# Patient Record
Sex: Female | Born: 1955
Health system: Southern US, Community
[De-identification: ages and names within clinical notes are randomized; demographics above are authoritative.]

## PROBLEM LIST (undated history)

## (undated) ENCOUNTER — Ambulatory Visit: Admission: EM

## (undated) DIAGNOSIS — M1990 Unspecified osteoarthritis, unspecified site: Secondary | ICD-10-CM

## (undated) DIAGNOSIS — F419 Anxiety disorder, unspecified: Secondary | ICD-10-CM

## (undated) DIAGNOSIS — N816 Rectocele: Secondary | ICD-10-CM

## (undated) DIAGNOSIS — K5900 Constipation, unspecified: Secondary | ICD-10-CM

## (undated) DIAGNOSIS — I779 Disorder of arteries and arterioles, unspecified: Secondary | ICD-10-CM

## (undated) DIAGNOSIS — D219 Benign neoplasm of connective and other soft tissue, unspecified: Secondary | ICD-10-CM

## (undated) DIAGNOSIS — F32A Depression, unspecified: Secondary | ICD-10-CM

## (undated) DIAGNOSIS — I2510 Atherosclerotic heart disease of native coronary artery without angina pectoris: Secondary | ICD-10-CM

## (undated) DIAGNOSIS — I351 Nonrheumatic aortic (valve) insufficiency: Secondary | ICD-10-CM

## (undated) DIAGNOSIS — F329 Major depressive disorder, single episode, unspecified: Secondary | ICD-10-CM

## (undated) DIAGNOSIS — M549 Dorsalgia, unspecified: Secondary | ICD-10-CM

## (undated) DIAGNOSIS — E7800 Pure hypercholesterolemia, unspecified: Secondary | ICD-10-CM

## (undated) DIAGNOSIS — E2740 Unspecified adrenocortical insufficiency: Secondary | ICD-10-CM

## (undated) HISTORY — DX: Unspecified adrenocortical insufficiency: E27.40

## (undated) HISTORY — DX: Constipation, unspecified: K59.00

## (undated) HISTORY — DX: Benign neoplasm of connective and other soft tissue, unspecified: D21.9

## (undated) HISTORY — DX: Pure hypercholesterolemia, unspecified: E78.00

## (undated) HISTORY — DX: Rectocele: N81.6

## (undated) HISTORY — DX: Nonrheumatic aortic (valve) insufficiency: I35.1

## (undated) HISTORY — DX: Depression, unspecified: F32.A

## (undated) HISTORY — DX: Unspecified osteoarthritis, unspecified site: M19.90

## (undated) HISTORY — DX: Dorsalgia, unspecified: M54.9

## (undated) HISTORY — DX: Anxiety disorder, unspecified: F41.9

## (undated) HISTORY — DX: Disorder of arteries and arterioles, unspecified: I77.9

## (undated) HISTORY — DX: Major depressive disorder, single episode, unspecified: F32.9

## (undated) HISTORY — DX: Atherosclerotic heart disease of native coronary artery without angina pectoris: I25.10

---

## 2000-08-30 ENCOUNTER — Emergency Department (HOSPITAL_COMMUNITY): Admission: EM | Admit: 2000-08-30 | Discharge: 2000-08-30 | Payer: Self-pay | Admitting: Emergency Medicine

## 2000-09-02 ENCOUNTER — Emergency Department (HOSPITAL_COMMUNITY): Admission: EM | Admit: 2000-09-02 | Discharge: 2000-09-02 | Payer: Self-pay | Admitting: Emergency Medicine

## 2000-10-18 ENCOUNTER — Emergency Department (HOSPITAL_COMMUNITY): Admission: EM | Admit: 2000-10-18 | Discharge: 2000-10-19 | Payer: Self-pay | Admitting: Emergency Medicine

## 2000-10-19 ENCOUNTER — Ambulatory Visit (HOSPITAL_COMMUNITY): Admission: RE | Admit: 2000-10-19 | Discharge: 2000-10-19 | Payer: Self-pay | Admitting: Internal Medicine

## 2000-10-19 ENCOUNTER — Encounter: Payer: Self-pay | Admitting: Internal Medicine

## 2000-12-11 ENCOUNTER — Ambulatory Visit (HOSPITAL_COMMUNITY): Admission: RE | Admit: 2000-12-11 | Discharge: 2000-12-11 | Payer: Self-pay | Admitting: Family Medicine

## 2000-12-11 ENCOUNTER — Encounter: Payer: Self-pay | Admitting: Family Medicine

## 2000-12-31 ENCOUNTER — Emergency Department (HOSPITAL_COMMUNITY): Admission: EM | Admit: 2000-12-31 | Discharge: 2000-12-31 | Payer: Self-pay | Admitting: *Deleted

## 2001-02-08 ENCOUNTER — Emergency Department (HOSPITAL_COMMUNITY): Admission: EM | Admit: 2001-02-08 | Discharge: 2001-02-09 | Payer: Self-pay | Admitting: *Deleted

## 2001-02-08 ENCOUNTER — Encounter: Payer: Self-pay | Admitting: Emergency Medicine

## 2001-03-04 ENCOUNTER — Encounter: Payer: Self-pay | Admitting: Internal Medicine

## 2001-03-04 ENCOUNTER — Ambulatory Visit (HOSPITAL_COMMUNITY): Admission: RE | Admit: 2001-03-04 | Discharge: 2001-03-04 | Payer: Self-pay | Admitting: Internal Medicine

## 2001-03-17 ENCOUNTER — Encounter: Payer: Self-pay | Admitting: Internal Medicine

## 2001-03-17 ENCOUNTER — Ambulatory Visit (HOSPITAL_COMMUNITY): Admission: RE | Admit: 2001-03-17 | Discharge: 2001-03-17 | Payer: Self-pay | Admitting: Internal Medicine

## 2001-05-01 ENCOUNTER — Emergency Department (HOSPITAL_COMMUNITY): Admission: EM | Admit: 2001-05-01 | Discharge: 2001-05-02 | Payer: Self-pay | Admitting: Internal Medicine

## 2001-05-14 ENCOUNTER — Encounter: Payer: Self-pay | Admitting: Internal Medicine

## 2001-05-14 ENCOUNTER — Emergency Department (HOSPITAL_COMMUNITY): Admission: EM | Admit: 2001-05-14 | Discharge: 2001-05-14 | Payer: Self-pay | Admitting: Internal Medicine

## 2002-01-11 ENCOUNTER — Ambulatory Visit (HOSPITAL_COMMUNITY): Admission: RE | Admit: 2002-01-11 | Discharge: 2002-01-11 | Payer: Self-pay | Admitting: Internal Medicine

## 2002-01-11 ENCOUNTER — Encounter: Payer: Self-pay | Admitting: Internal Medicine

## 2002-02-17 ENCOUNTER — Encounter: Payer: Self-pay | Admitting: Emergency Medicine

## 2002-02-17 ENCOUNTER — Emergency Department (HOSPITAL_COMMUNITY): Admission: EM | Admit: 2002-02-17 | Discharge: 2002-02-17 | Payer: Self-pay | Admitting: Emergency Medicine

## 2002-02-18 ENCOUNTER — Emergency Department (HOSPITAL_COMMUNITY): Admission: EM | Admit: 2002-02-18 | Discharge: 2002-02-18 | Payer: Self-pay | Admitting: Internal Medicine

## 2002-10-10 ENCOUNTER — Encounter: Payer: Self-pay | Admitting: Internal Medicine

## 2002-10-10 ENCOUNTER — Ambulatory Visit (HOSPITAL_COMMUNITY): Admission: RE | Admit: 2002-10-10 | Discharge: 2002-10-10 | Payer: Self-pay | Admitting: Internal Medicine

## 2003-01-06 ENCOUNTER — Ambulatory Visit (HOSPITAL_COMMUNITY): Admission: RE | Admit: 2003-01-06 | Discharge: 2003-01-06 | Payer: Self-pay | Admitting: *Deleted

## 2003-01-18 ENCOUNTER — Emergency Department (HOSPITAL_COMMUNITY): Admission: EM | Admit: 2003-01-18 | Discharge: 2003-01-18 | Payer: Self-pay | Admitting: Internal Medicine

## 2003-02-01 ENCOUNTER — Ambulatory Visit (HOSPITAL_COMMUNITY): Admission: RE | Admit: 2003-02-01 | Discharge: 2003-02-01 | Payer: Self-pay | Admitting: Internal Medicine

## 2003-02-03 ENCOUNTER — Ambulatory Visit (HOSPITAL_COMMUNITY): Admission: RE | Admit: 2003-02-03 | Discharge: 2003-02-03 | Payer: Self-pay | Admitting: Internal Medicine

## 2003-03-06 ENCOUNTER — Ambulatory Visit (HOSPITAL_COMMUNITY): Admission: RE | Admit: 2003-03-06 | Discharge: 2003-03-06 | Payer: Self-pay | Admitting: Internal Medicine

## 2003-03-13 ENCOUNTER — Ambulatory Visit (HOSPITAL_COMMUNITY): Admission: RE | Admit: 2003-03-13 | Discharge: 2003-03-13 | Payer: Self-pay | Admitting: Internal Medicine

## 2003-05-22 ENCOUNTER — Encounter (HOSPITAL_COMMUNITY): Admission: RE | Admit: 2003-05-22 | Discharge: 2003-06-21 | Payer: Self-pay | Admitting: Orthopedic Surgery

## 2003-06-22 ENCOUNTER — Encounter (HOSPITAL_COMMUNITY): Admission: RE | Admit: 2003-06-22 | Discharge: 2003-07-22 | Payer: Self-pay | Admitting: Orthopedic Surgery

## 2003-08-04 ENCOUNTER — Ambulatory Visit (HOSPITAL_COMMUNITY): Admission: RE | Admit: 2003-08-04 | Discharge: 2003-08-04 | Payer: Self-pay | Admitting: Internal Medicine

## 2003-11-18 ENCOUNTER — Emergency Department (HOSPITAL_COMMUNITY): Admission: EM | Admit: 2003-11-18 | Discharge: 2003-11-18 | Payer: Self-pay | Admitting: Emergency Medicine

## 2004-01-08 ENCOUNTER — Other Ambulatory Visit: Admission: RE | Admit: 2004-01-08 | Discharge: 2004-01-08 | Payer: Self-pay | Admitting: Obstetrics and Gynecology

## 2004-01-25 ENCOUNTER — Ambulatory Visit (HOSPITAL_COMMUNITY): Admission: RE | Admit: 2004-01-25 | Discharge: 2004-01-25 | Payer: Self-pay | Admitting: Family Medicine

## 2004-02-10 ENCOUNTER — Emergency Department (HOSPITAL_COMMUNITY): Admission: EM | Admit: 2004-02-10 | Discharge: 2004-02-10 | Payer: Self-pay | Admitting: *Deleted

## 2004-02-11 ENCOUNTER — Inpatient Hospital Stay (HOSPITAL_COMMUNITY): Admission: AD | Admit: 2004-02-11 | Discharge: 2004-02-11 | Payer: Self-pay | Admitting: Cardiology

## 2004-02-13 ENCOUNTER — Ambulatory Visit (HOSPITAL_COMMUNITY): Admission: RE | Admit: 2004-02-13 | Discharge: 2004-02-13 | Payer: Self-pay | Admitting: Internal Medicine

## 2004-03-06 ENCOUNTER — Ambulatory Visit (HOSPITAL_COMMUNITY): Admission: RE | Admit: 2004-03-06 | Discharge: 2004-03-06 | Payer: Self-pay | Admitting: *Deleted

## 2004-03-08 ENCOUNTER — Ambulatory Visit (HOSPITAL_COMMUNITY): Admission: RE | Admit: 2004-03-08 | Discharge: 2004-03-09 | Payer: Self-pay | Admitting: Cardiology

## 2004-03-08 HISTORY — PX: CARDIAC CATHETERIZATION: SHX172

## 2004-03-14 ENCOUNTER — Emergency Department (HOSPITAL_COMMUNITY): Admission: EM | Admit: 2004-03-14 | Discharge: 2004-03-15 | Payer: Self-pay | Admitting: *Deleted

## 2004-04-04 ENCOUNTER — Emergency Department (HOSPITAL_COMMUNITY): Admission: EM | Admit: 2004-04-04 | Discharge: 2004-04-04 | Payer: Self-pay | Admitting: Emergency Medicine

## 2004-06-24 ENCOUNTER — Emergency Department (HOSPITAL_COMMUNITY): Admission: EM | Admit: 2004-06-24 | Discharge: 2004-06-24 | Payer: Self-pay | Admitting: Emergency Medicine

## 2005-03-10 ENCOUNTER — Ambulatory Visit (HOSPITAL_COMMUNITY): Admission: RE | Admit: 2005-03-10 | Discharge: 2005-03-10 | Payer: Self-pay | Admitting: Internal Medicine

## 2005-05-21 ENCOUNTER — Encounter (HOSPITAL_COMMUNITY): Admission: RE | Admit: 2005-05-21 | Discharge: 2005-06-20 | Payer: Self-pay | Admitting: Neurosurgery

## 2005-06-26 ENCOUNTER — Encounter (HOSPITAL_COMMUNITY): Admission: RE | Admit: 2005-06-26 | Discharge: 2005-07-26 | Payer: Self-pay | Admitting: Neurosurgery

## 2005-12-08 ENCOUNTER — Emergency Department (HOSPITAL_COMMUNITY): Admission: EM | Admit: 2005-12-08 | Discharge: 2005-12-08 | Payer: Self-pay | Admitting: Emergency Medicine

## 2006-01-12 ENCOUNTER — Ambulatory Visit (HOSPITAL_COMMUNITY): Admission: RE | Admit: 2006-01-12 | Discharge: 2006-01-12 | Payer: Self-pay | Admitting: Urology

## 2006-03-19 HISTORY — PX: NM MYOCAR PERF WALL MOTION: HXRAD629

## 2006-11-30 ENCOUNTER — Ambulatory Visit (HOSPITAL_COMMUNITY): Admission: RE | Admit: 2006-11-30 | Discharge: 2006-11-30 | Payer: Self-pay | Admitting: Internal Medicine

## 2007-03-09 HISTORY — PX: OTHER SURGICAL HISTORY: SHX169

## 2008-09-28 ENCOUNTER — Other Ambulatory Visit: Admission: RE | Admit: 2008-09-28 | Discharge: 2008-09-28 | Payer: Self-pay | Admitting: Obstetrics and Gynecology

## 2008-10-03 ENCOUNTER — Ambulatory Visit (HOSPITAL_COMMUNITY): Admission: RE | Admit: 2008-10-03 | Discharge: 2008-10-03 | Payer: Self-pay | Admitting: Obstetrics and Gynecology

## 2009-03-12 ENCOUNTER — Ambulatory Visit (HOSPITAL_COMMUNITY): Admission: RE | Admit: 2009-03-12 | Discharge: 2009-03-12 | Payer: Self-pay | Admitting: Internal Medicine

## 2009-10-18 ENCOUNTER — Other Ambulatory Visit: Admission: RE | Admit: 2009-10-18 | Discharge: 2009-10-18 | Payer: Self-pay | Admitting: Obstetrics and Gynecology

## 2010-03-20 HISTORY — PX: DOPPLER ECHOCARDIOGRAPHY: SHX263

## 2010-06-07 NOTE — Cardiovascular Report (Signed)
NAMEPEYTYN, TRINE               ACCOUNT NO.:  [id number]   MEDICAL RECORD NO.:  [id number]          PATIENT TYPE:  OIB   LOCATION:  3705                         FACILITY:  [facility]   PHYSICIAN:  Vonna Kotyk R. Jacinto Halim, MD       DATE OF BIRTH:  [birth year]   DATE OF PROCEDURE:  03/08/2004  DATE OF DISCHARGE:  03/09/2004                              CARDIAC CATHETERIZATION   REFERRING PHYSICIAN:  1.  Kem Boroughs, M.D.  2.  Elfredia Nevins, M.D.   PROCEDURE PERFORMED:  1.  Left ventriculography.  2.  Selective right and left coronary arteriography.  3.  Intercoronary nitroglycerin administration.  4.  Intravascular ultrasound Interrogation of ostial right coronary artery.   INDICATION:  Ms. Debra Graves is a 55 year old female with a history of  GERD, hypertension, hyperlipidemia and known coronary artery disease.  She  had known ostial RCA, about 60-70% calcific stenosis from prior cardiac  catheterization on  January 06, 2003.  Because of recurrent chest pain she  underwent a coronary stress test which had revealed anterior wall ischemia.  She was brought to the cardiac catheterization lab for evaluation of  progression of coronary disease.   HEMODYNAMIC DATA:  The left ventricular pressures were 115/6 with end-  diastolic pressure of 13 mmHg.  The aortic pressures were 120/66 with a mean  of 91 mmHg.  There was no pressure gradient across the aortic valve.   ANGIOGRAPHIC DATA:  Left ventricle.  Left ventricular systolic function was  normal, ejection fraction was estimated at 60%.  There was no significant  mitral regurgitation.   Right coronary artery.  The right coronary artery is a large caliber vessel  and the dominant vessel.  It is tortuous.  The ostium has again calcific  hazy looking 60-70% stenosis which was damp, in which there was severe  damping with engagement of the right coronary artery.   Left main coronary artery.  The left main coronary artery is a large caliber  vessel.  It has got mild diffuse calcification.  There is no luminal  obstruction.   Left anterior descending artery.  Left anterior descending artery is a large  caliber vessel.  It gives rise into a small diagonal-1 and a moderate to  large sized diagonal-2.  There is mild disease in the LAD and also diagonal.  Especially the mid LAD shows a mildly calcific hazy 20-30% stenosis but is  very smooth.   Circumflex.  The circumflex is a large caliber vessel.  It has got mild  luminal irregularity.   Ramus intermedius.  Ramus intermedius is a moderate caliber vessel.  It is  normal.   IMPRESSIONS:  1.  Normal left ventricular systolic function, ejection fraction 60%.  2.  Mild progression of coronary artery disease.  There is diffuse      calcification noted in the right coronary artery and also in the left      coronary system.  Probably 30% stenosis, at most 40, in the mid left      anterior descending which is mildly calcified and hazy but does not  appear to be unstable.  3.  Stenosis, 60-70%, of the ostial right coronary artery.   IVUS DATA:  There was mild eccentric calcification of the proximal and  ostial RCA.  There was at most 51% luminal narrowing of the ostial RCA.  The  lumen had 4.1 mm2 area.   RECOMMENDATION:  Given the fact that the lumen area is greater than 4 mm and  also the angiographic appearance has not significantly changed from prior  cardiac catheterization on January 06, 2003, and the fact that the  Cardiolite showed anterior ischemia, this right coronary artery involvement  does not correlate with the stress data.  Hence, continued medical therapy  with very aggressive risk factor modification is indicated given  calcification of the coronaries is indicated.  The LDL goal of 70 and HDL >  50 is indicated.  If patient continues to have recurrent chest discomfort, consideration can  be given for PCI of the ostial RCA.   TECHNIQUE AND PROCEDURE:   Diagnostic cardiac catheterization under the usual  sterile precautions using a 6 French right femoral artery access, a 6 [place]  multipurpose B2 catheter was advanced into the ascending aorta with a 0.025  J wire.  The catheter was gently advanced into the left ventricle, left  ventricular pressures were monitored.  Hand contrast injection of the left  ventricle was performed both in the LAO and RAO projections.  The catheter  was flushed with saline, pulled back into the ascending aorta and pressure  gradient across the aortic valve was monitored.  The right coronary artery  was selectively engaged and angiography was performed.  In a similar  fashion, the left main coronary artery was selectively engaged and  angiography was performed.  Then the catheter was pulled out of the body in  the usual fashion.   TECHNIQUE OF IVUS INTERROGATION:  After giving 5000 units of heparin and  keeping the ACT at therapeutic range, a 190 cm x 0.014 inch Luge guide wire  was advanced into the right coronary artery and the tip of the wire was  carefully positioned in the distal RCA.  Then a Galaxy IVUS catheter was  utilized to perform IVUS interrogation.  The careful automatic pullback was  performed and the data was carefully analyzed.  Then the IVUS catheter was  withdrawn out of the body and angiography was repeated.  During the  procedure, twice 200 mg  intracoronary nitroglycerin was also administered.  Then the catheter and  the guide wire was withdrawn out of the body in the usual fashion.  Patient  tolerated the procedure well.  A total of 125 mL contrast was utilized for  the procedure.      JRG/MEDQ  D:  03/08/2004  T:  03/10/2004  Job:  [id number]   cc:   Dani Gobble, MD  Fax: [phone]   Madelin Rear. Sherwood Gambler, MD  [address]  [place]  [place] [address]  Fax: [phone]

## 2010-06-07 NOTE — Discharge Summary (Signed)
Debra Graves, Debra Graves               ACCOUNT NO.:  [id number]   MEDICAL RECORD NO.:  [id number]          PATIENT TYPE:  OIB   LOCATION:  3705                         FACILITY:  [facility]   PHYSICIAN:  Pamella Pert, M.D.DATE OF BIRTH:  Oct 15, 1955   DATE OF ADMISSION:  03/08/2004  DATE OF DISCHARGE:  03/09/2004                                 DISCHARGE SUMMARY   ADMISSION DIAGNOSES:  1.  Chest pain with known coronary artery disease, mild trivial mitral      regurgitation and tricuspid regurgitation.  2.  Gastroesophageal reflux disease.  3.  Depression.  4.  Hyperlipidemia.  5.  Ongoing tobacco use.   DISCHARGE DIAGNOSES:  1.  Chest pain with known coronary artery disease, mild trivial mitral      regurgitation and tricuspid regurgitation.  2.  Gastroesophageal reflux disease.  3.  Depression.  4.  Hyperlipidemia.  5.  Ongoing tobacco use.   PROCEDURE:  Cardiac catheterization , March 08, 2004, Dr. Jacinto Halim.   BRIEF HISTORY:  The patient is a 55 year old female medical patient of Dr.  Artis Delay with multiple medical problems.  She previously underwent  cardiac catheterization in 2004 for chest pain which showed 10% ostial left  main, stenosis in the left circumflex with mild luminal irregularities,  normal ramus intermedius, LAD had a 20% stenosis.  The RCA had an ostial 70%  stenosis.  The patient has returned with recurrent chest pain and was  admitted to [facility] and transferred to [facility] for  cardiac evaluation in late January but signed out AMA.  She now returns and  is admitted for elective cardiac catheterization.   PAST MEDICAL HISTORY:  As listed above.   For further history and physical, please see the dictated note.   HOSPITAL COURSE:  The patient was admitted and taken to the cath lab by Dr.  Jacinto Halim.  She underwent cardiac catheterization which showed the ostial RCA  still has a 50-60% stenosis.  This was evaluated by IVUS.  The LAD has  a 30%  to 40% stenosis in the mid portion.  The EF was 60%.  There was no mitral  regurgitation.  There was essentially no change from her previous study.  Dr. Jacinto Halim added Imdur to the patient's regimen and plans to discharge her  home.  She was seen the following morning by Dr. Elsie Lincoln.  Electrolytes were  normal, sodium was 140, potassium 4.1, chloride 109, CO2 26, BUN 12,  creatinine 0.8, glucose 102.  Hemoglobin 10.6, hematocrit 38.1, white count  6800, platelets 204,000.  The catheterization site looks good, she is having  no chest discomfort and we will plan to discharge her home today to resume  her home medications.   MEDICATIONS:  1.  Zoloft 200 mg daily.  2.  Aspirin 81 mg daily.  3.  Ativan 1 mg five times a day.  4.  Nexium 40 mg daily.  5.  __________ daily.  6.  Arthrotec q.i.d.  7.  Activella one daily.  8.  Niferex 150 mg daily.  9.  Nitroglycerin 0.4 mg sublingual  p.r.n.  10. Lasix 20 mg daily p.r.n.  11. Antivert 25 mg daily p.r.n.  12. Imdur 30 mg daily.   ACTIVITY:  Light to moderate, no lifting over 10 pounds, no driving or  strenuous activity.   Will plan to follow up with Dr. Domingo Sep in 2 weeks and our office will call  and make the appointment.      WDJ/MEDQ  D:  03/09/2004  T:  03/11/2004  Job:  [id number]   cc:   Madelin Rear. Sherwood Gambler, MD  [address]  [place]  [place] [address]  Fax: [phone]

## 2010-06-07 NOTE — Cardiovascular Report (Signed)
NAME:  Debra Graves, Debra Graves                         ACCOUNT NO.:  [id number]   MEDICAL RECORD NO.:  [id number]                   PATIENT TYPE:  OIB   LOCATION:  2899                                 FACILITY:  [facility]   PHYSICIAN:  Cristy Hilts. Jacinto Halim, M.D.                  DATE OF BIRTH:  1955-04-22   DATE OF PROCEDURE:  01/06/2003  DATE OF DISCHARGE:  01/06/2003                              CARDIAC CATHETERIZATION   REFERRING PHYSICIAN:  Kirk Ruths, M.D.   PROCEDURES PERFORMED:  1. Left ventriculography.  2. Selective right and left coronary arteriography.  3. Abdominal aortogram.  4. Right femoral angiography.  5. Closure of right femoral artery access with Angio-Seal.   CARDIOLOGIST:  Cristy Hilts. Jacinto Halim, M.D.   INDICATIONS:  Ms. Debra Graves is a 55 year old female with history of  hyperlipidemia, smoking and a very strong family history of premature  coronary artery disease who has been complaining of chest discomfort and  shortness of breath.  She underwent a Cardiolite stress test, which revealed  anterior wall ischemia.  Given this she was brought to the cardiac  catheterization lab to evaluate her coronary anatomy.   HEMODYNAMIC DATA:  Left ventricular pressure 113.8 with an end-diastolic  pressure of 16 mmHg.  Aortic pressure 134/95 with a mean of 100 mmHg.  There  is no pressure gradient across the aortic valve.   ANGIOGRAPHIC DATA:  Left Ventricle:  The left ventricular systolic function  was normal and the ejection fraction was estimated at 60%.  There was no  significant mitral regurgitation.   Right Coronary Artery:  The right coronary artery is a large caliber vessel.  It was very difficult to cannulate with severe damping noted with multiple  catheters.  Initially an MPB-2 6 [place] was utilized, then a JR-4, then  eventually a 5 [place] AR-1, and a 5 [place] Williams catheter was utilized.  Eventually with the help of the [facility] catheter, with minimal damping,  right  coronary angiography revealed ostial 70% stenosis.  The distal vessel  looked very smooth and a large caliber vessel with a large PDA and PLV with  noncritical coronary disease on the right.   Left Main Coronary Artery:  The left main coronary artery is a large caliber  vessel.  It has ostial 10-20% stenosis.   Circumflex Artery:  The circumflex is a large caliber vessel.  It gives  origin to a small obtuse marginal-1 and it continues as a large obtuse  marginal-2 .  It also gives a large AV groove branch.  This has noncritical  coronary disease.   Ramus Intermedius:  The ramus intermedius is a small-to-medium size vessel.  It is normal.   Left Ventricular Artery:  The left ventricular artery is a large caliber  vessel.  It wraps around the apex.  It gives origin to a small D-1 and a  moderate-sized diagonal-2.  Diagonal-2 has  proximal 20% stenosis.   AORTOGRAPHIC DATA:  Abdominal Aortogram:  Abdominal aortogram reveals no  evidence of abdominal aortic aneurysm.  The renal arteries were widely  patent.   IMPRESSION:  1. Normal left ventricular systolic function, ejection fraction 60% and end-     diastolic pressure 16 millimeters of mercury.  No evidence of aortic     stenosis.  2. Significant ostial right coronary artery stenosis, about 70%, with severe     damping noted with a 5 French catheter.  3. Noncritical coronary artery disease of the left system.   RECOMMENDATIONS:  The Cardiolite stress test showed anterior wall ischemia.  Although the RCA appears to be significant, given the physiologic test,  which reveals only anterior wall ischemia, which is at this point falsely  positive given noncritical coronary artery disease of the left system,  continued medical therapy is indicated.  Aggressive risk factor modification  including smoking cessation is indicated.  We will try to lipid LDL levels  to less than or close to 70.  We will also discuss with my colleagues to see   if PCI would be beneficial.   TECHNIQUE OF THE PROCEDURE:  Under the usual sterile precautions using a 6  French right femoral artery access a 6 [place] multipurpose B-2 catheter was  advanced through the ascending aorta over a 0.014 inch wire.  The catheter  was advanced to the left ventricle and left ventricular pressure was  monitored.  Hand contrast injections were performed both in the LAO and RAO  projections.  The catheter was then flushed and pulled back into the  ascending aorta and pressure gradient across the aortic pulse monitored.  The right coronary artery was selectively engaged and coiled because of  severe damping.  The catheter was pulled out and the left main coronary  artery was selectively engaged, and angiography was performed.  The catheter  was exchanged for a 6 [place]  JR-4 and, again because of damping a 6 [place]  AR-1 and then 5 [place] AR-1, and eventually a 5 [place] Williams catheter was  utilized to engage the right coronary artery.  After careful engagement of  the RCA angiography was repeated.  Then the catheter was pulled out of the  body in the usual fashion.  Before the multipurpose B-2 catheter was pulled  out of the body abdominal aortogram was performed with 10 mL contrast  injection.   The patient tolerated the procedure.  Right femoral angiography was  performed through the arterial access sheath and the access was closed with  Angio-Seal.  The patient was then transferred to recovery.                                               Cristy Hilts. Jacinto Halim, M.D.    Pilar Plate  D:  01/06/2003  T:  01/07/2003  Job:  [id number]   cc:   Dani Gobble, MD  Fax: [phone]   Kirk Ruths, M.D.  [address]  [place]  [place] [address]  Fax: [phone]

## 2011-01-30 ENCOUNTER — Other Ambulatory Visit: Payer: Self-pay | Admitting: Adult Health

## 2011-01-30 DIAGNOSIS — Z139 Encounter for screening, unspecified: Secondary | ICD-10-CM

## 2011-02-04 ENCOUNTER — Ambulatory Visit (HOSPITAL_COMMUNITY): Payer: Self-pay

## 2011-06-03 ENCOUNTER — Other Ambulatory Visit (HOSPITAL_COMMUNITY): Payer: Self-pay | Admitting: Internal Medicine

## 2011-06-03 DIAGNOSIS — Z139 Encounter for screening, unspecified: Secondary | ICD-10-CM

## 2011-06-09 ENCOUNTER — Ambulatory Visit (HOSPITAL_COMMUNITY)
Admission: RE | Admit: 2011-06-09 | Discharge: 2011-06-09 | Disposition: A | Discharge status: Home / Self Care | Payer: Medicaid Other | Source: Ambulatory Visit | Attending: Internal Medicine | Admitting: Internal Medicine

## 2011-06-09 DIAGNOSIS — Z1231 Encounter for screening mammogram for malignant neoplasm of breast: Secondary | ICD-10-CM | POA: Insufficient documentation

## 2011-06-09 DIAGNOSIS — Z139 Encounter for screening, unspecified: Secondary | ICD-10-CM

## 2012-04-13 ENCOUNTER — Encounter: Payer: Self-pay | Admitting: Adult Health

## 2012-04-13 DIAGNOSIS — D219 Benign neoplasm of connective and other soft tissue, unspecified: Secondary | ICD-10-CM | POA: Insufficient documentation

## 2012-04-13 DIAGNOSIS — F419 Anxiety disorder, unspecified: Secondary | ICD-10-CM | POA: Insufficient documentation

## 2012-04-13 DIAGNOSIS — F329 Major depressive disorder, single episode, unspecified: Secondary | ICD-10-CM

## 2012-04-13 DIAGNOSIS — M1990 Unspecified osteoarthritis, unspecified site: Secondary | ICD-10-CM

## 2012-04-13 DIAGNOSIS — E7800 Pure hypercholesterolemia, unspecified: Secondary | ICD-10-CM

## 2012-04-13 DIAGNOSIS — I519 Heart disease, unspecified: Secondary | ICD-10-CM | POA: Insufficient documentation

## 2012-04-14 ENCOUNTER — Other Ambulatory Visit (HOSPITAL_COMMUNITY)
Admission: RE | Admit: 2012-04-14 | Discharge: 2012-04-14 | Disposition: A | Discharge status: Home / Self Care | Payer: Medicaid Other | Source: Ambulatory Visit | Attending: Obstetrics and Gynecology | Admitting: Obstetrics and Gynecology

## 2012-04-14 ENCOUNTER — Ambulatory Visit (INDEPENDENT_AMBULATORY_CARE_PROVIDER_SITE_OTHER): Payer: Medicaid Other | Admitting: Adult Health

## 2012-04-14 ENCOUNTER — Other Ambulatory Visit: Payer: Self-pay | Admitting: Adult Health

## 2012-04-14 ENCOUNTER — Encounter: Payer: Self-pay | Admitting: Adult Health

## 2012-04-14 VITALS — BP 118/70 | HR 72 | Ht 66.0 in | Wt 199.0 lb

## 2012-04-14 DIAGNOSIS — Z0000 Encounter for general adult medical examination without abnormal findings: Secondary | ICD-10-CM

## 2012-04-14 DIAGNOSIS — Z1212 Encounter for screening for malignant neoplasm of rectum: Secondary | ICD-10-CM

## 2012-04-14 DIAGNOSIS — Z1151 Encounter for screening for human papillomavirus (HPV): Secondary | ICD-10-CM | POA: Insufficient documentation

## 2012-04-14 DIAGNOSIS — Z01419 Encounter for gynecological examination (general) (routine) without abnormal findings: Secondary | ICD-10-CM

## 2012-04-14 DIAGNOSIS — N816 Rectocele: Secondary | ICD-10-CM

## 2012-04-14 DIAGNOSIS — K5900 Constipation, unspecified: Secondary | ICD-10-CM

## 2012-04-14 DIAGNOSIS — I2510 Atherosclerotic heart disease of native coronary artery without angina pectoris: Secondary | ICD-10-CM

## 2012-04-14 HISTORY — DX: Rectocele: N81.6

## 2012-04-14 HISTORY — DX: Constipation, unspecified: K59.00

## 2012-04-14 LAB — HEMOCCULT GUIAC POC 1CARD (OFFICE): Fecal Occult Blood, POC: NEGATIVE

## 2012-04-14 NOTE — Progress Notes (Signed)
Subjective:     Patient ID: Debra Graves, female   DOB: October 18, 1955, 57 y.o.   MRN: [id number]  HPI Rayne is a 57 year old white female in today for Pap and physical. She was taking Activella but has been off of it for greater than a month, and will try to stay off the activella.   Review of Systems Patient denies any headaches, blurred vision, shortness of breath, chest pain, abdominal pain, problems with urination. She is complaining of some constipation and body aches and she says she does feel depressed and anxious at times. She is not currently sexually active. Reviewed past medical, surgical, social, and family history. Reviewed medications and allergies.     Objective:   Physical ExamVital signs: Blood pressure 118/70, weight 199 pounds, height 66 inches, pulse 72 and regular. Skin: Warm and dry ENT: No hearing loss, normal nares, no palpable nodes Neck: Midline trachea. Thyroid normal. No carotid bruits heard. Lungs: Clear to auscultation bilaterally. Breasts: No dominant palpable mass, retraction, or nipple discharge. Cardiovascular: Regular rate and rhythm. Abdomen: Soft and non-tender, no hepatosplenomegaly. Pelvic: External genitalia is normal and appearance. The vagina is normal in appearance. Cervix is bulbous with negative cervical motion tenderness.Pap smear performed with HPV added to thin prep. Uterus felt to be normal size shape and contour. No adnexal masses or tenderness noted.Rectal exam:No polyps or hemorrhoids, good sphincter tone, she does have a rectocele.Extremities: No swelling or varicose veins. Psych: depressed and anxious at times.     Assessment:     Yearly exam Rectocele Constipation CAD Hx. Anxiety and depression    Plan:     Reviewed medical explainer #4 with pt. And copy home Colonoscopy advised Mammogram yearly Labs at PCP or heart MD Return in 1 year physical Try increasing fiber(prunes,oatmeal,applesauce)

## 2012-04-14 NOTE — Patient Instructions (Addendum)
Return to clinic in 1 year, stay off activella  Call any problems,sign up for my chart

## 2012-05-25 ENCOUNTER — Other Ambulatory Visit (HOSPITAL_COMMUNITY): Payer: Self-pay | Admitting: Internal Medicine

## 2012-05-25 DIAGNOSIS — Z139 Encounter for screening, unspecified: Secondary | ICD-10-CM

## 2012-06-15 ENCOUNTER — Ambulatory Visit (HOSPITAL_COMMUNITY)
Admission: RE | Admit: 2012-06-15 | Discharge: 2012-06-15 | Disposition: A | Discharge status: Home / Self Care | Payer: Medicaid Other | Source: Ambulatory Visit | Attending: Internal Medicine | Admitting: Internal Medicine

## 2012-06-15 ENCOUNTER — Ambulatory Visit (HOSPITAL_COMMUNITY): Payer: Medicaid Other

## 2012-06-15 DIAGNOSIS — Z139 Encounter for screening, unspecified: Secondary | ICD-10-CM

## 2012-06-15 DIAGNOSIS — Z1231 Encounter for screening mammogram for malignant neoplasm of breast: Secondary | ICD-10-CM | POA: Insufficient documentation

## 2012-07-21 ENCOUNTER — Other Ambulatory Visit: Payer: Self-pay

## 2012-07-21 MED ORDER — ISOSORBIDE MONONITRATE ER 30 MG PO TB24: 30.0000 mg | ORAL_TABLET | Freq: Every day | ORAL | Status: DC

## 2012-07-21 NOTE — Telephone Encounter (Signed)
Rx was sent to pharmacy electronically. 

## 2012-07-27 ENCOUNTER — Other Ambulatory Visit (HOSPITAL_COMMUNITY): Payer: Self-pay | Admitting: Internal Medicine

## 2012-07-27 ENCOUNTER — Ambulatory Visit (HOSPITAL_COMMUNITY)
Admission: RE | Admit: 2012-07-27 | Discharge: 2012-07-27 | Disposition: A | Discharge status: Home / Self Care | Payer: Medicaid Other | Source: Ambulatory Visit | Attending: Internal Medicine | Admitting: Internal Medicine

## 2012-07-27 DIAGNOSIS — M79609 Pain in unspecified limb: Secondary | ICD-10-CM | POA: Insufficient documentation

## 2012-07-27 DIAGNOSIS — S93609A Unspecified sprain of unspecified foot, initial encounter: Secondary | ICD-10-CM

## 2012-07-27 DIAGNOSIS — S92919A Unspecified fracture of unspecified toe(s), initial encounter for closed fracture: Secondary | ICD-10-CM | POA: Insufficient documentation

## 2012-07-27 DIAGNOSIS — X58XXXA Exposure to other specified factors, initial encounter: Secondary | ICD-10-CM | POA: Insufficient documentation

## 2012-11-19 ENCOUNTER — Other Ambulatory Visit: Payer: Self-pay | Admitting: *Deleted

## 2012-11-19 MED ORDER — ROSUVASTATIN CALCIUM 20 MG PO TABS: 20.0000 mg | ORAL_TABLET | Freq: Every day | ORAL | Status: DC

## 2012-11-19 NOTE — Telephone Encounter (Signed)
Rx was sent to pharmacy electronically. 

## 2013-02-22 ENCOUNTER — Ambulatory Visit (INDEPENDENT_AMBULATORY_CARE_PROVIDER_SITE_OTHER): Payer: Medicaid Other | Admitting: Cardiovascular Disease

## 2013-02-22 ENCOUNTER — Encounter: Payer: Self-pay | Admitting: Cardiovascular Disease

## 2013-02-22 VITALS — BP 122/60 | HR 84 | Resp 16 | Ht 66.0 in | Wt 189.4 lb

## 2013-02-22 DIAGNOSIS — Z79899 Other long term (current) drug therapy: Secondary | ICD-10-CM

## 2013-02-22 DIAGNOSIS — I350 Nonrheumatic aortic (valve) stenosis: Secondary | ICD-10-CM

## 2013-02-22 DIAGNOSIS — I351 Nonrheumatic aortic (valve) insufficiency: Secondary | ICD-10-CM

## 2013-02-22 DIAGNOSIS — I359 Nonrheumatic aortic valve disorder, unspecified: Secondary | ICD-10-CM

## 2013-02-22 DIAGNOSIS — R0602 Shortness of breath: Secondary | ICD-10-CM

## 2013-02-22 DIAGNOSIS — I2510 Atherosclerotic heart disease of native coronary artery without angina pectoris: Secondary | ICD-10-CM

## 2013-02-22 DIAGNOSIS — E782 Mixed hyperlipidemia: Secondary | ICD-10-CM

## 2013-02-22 DIAGNOSIS — I519 Heart disease, unspecified: Secondary | ICD-10-CM

## 2013-02-22 HISTORY — DX: Atherosclerotic heart disease of native coronary artery without angina pectoris: I25.10

## 2013-02-22 HISTORY — DX: Nonrheumatic aortic (valve) insufficiency: I35.1

## 2013-02-22 NOTE — Assessment & Plan Note (Signed)
Her symptoms do not sound suggestive of coronary insufficiency and revascularization would not be indicated in the absence of angina. We'll check an echo to see left ventricular systolic function and reevaluate the aortic insufficiency. Will need repeat risk stratification for CAD if there is a drop in LVEF. She is on appropriate lipid lowering treatment and has an excellent blood pressure.

## 2013-02-22 NOTE — Progress Notes (Signed)
Patient ID: Debra Graves, female   DOB: 03/16/1955, 58 y.o.   MRN: [id number]      Reason for office visit Followup CAD and aortic insufficiency  Debra Graves is a 58 year old woman with moderate coronary artery disease (60-70% ostial RCA and 30-40% mid LAD) and mild to moderate aortic insufficiency (last echo 2012), preserved left ventricular systolic function, hyperlipidemia and hypertension.  She complains of fatigue and breathlessness. Fatigue appears to be the dominant complaint. She is unable to do all of her housework as rapidly as she did in the past. She lives alone. She has not had any angina when she exerts but does occasionally feel out of breath. She denies palpitations, syncope, focal logical complaints, edema or intermittent claudication. She has not had any other interim health problems.   Allergies  Allergen Reactions  . Albuterol   . Bactrim [Sulfamethoxazole-Trimethoprim]     Current Outpatient Prescriptions  Medication Sig Dispense Refill  . aspirin 81 MG tablet Take 81 mg by mouth daily.      . clonazePAM (KLONOPIN) 0.5 MG tablet Take 0.5 mg by mouth daily.      . diclofenac (VOLTAREN) 50 MG EC tablet Take 50 mg by mouth 2 (two) times daily.      [name] Calcium (STOOL SOFTENER PO) Take 2 tablets by mouth daily.      . DULoxetine (CYMBALTA) 60 MG capsule Take 60 mg by mouth daily.      Marland Kitchen esomeprazole (NEXIUM) 40 MG capsule Take 40 mg by mouth daily at 12 noon.      . fish oil-omega-3 fatty acids 1000 MG capsule Take 2 g by mouth daily.      Marland Kitchen HYDROcodone-acetaminophen (NORCO/VICODIN) 5-325 MG per tablet Take 1 tablet by mouth every 4 (four) hours as needed for pain.      . isosorbide mononitrate (IMDUR) 30 MG 24 hr tablet Take 1 tablet (30 mg total) by mouth daily.  30 tablet  7  . LORazepam (ATIVAN) 1 MG tablet Take 1 mg by mouth every 6 (six) hours as needed for anxiety.      . Multiple Vitamin (MULTIVITAMIN) capsule Take 1 capsule by mouth daily.      .  rosuvastatin (CRESTOR) 20 MG tablet Take 1 tablet (20 mg total) by mouth daily.  30 tablet  3   No current facility-administered medications for this visit.    Past Medical History  Diagnosis Date  . Coronary artery disease   . Mental disorder     anxiety,depression  . Arthritis   . Hypercholesteremia   . Fibroids   . Anxiety   . Depression   . Rectocele 04/14/2012  . Constipation 04/14/2012  . CAD (coronary artery disease) 02/22/2013  . Aortic insufficiency 02/22/2013    Past Surgical History  Procedure Laterality Date  . Cardiac catheterization  03/08/2004    mild progressive CAD    Family History  Problem Relation Age of Onset  . Cancer Mother 90    Breast then bones and all over  . Coronary artery disease Father   . Heart disease Father   . Stroke Father   . Cancer Sister 39    breast  . Hypertension Sister   . Stroke Brother   . Coronary artery disease Brother   . Coronary artery disease Brother   . Coronary artery disease Brother   . Heart disease Sister     mi    History   Social History  . Marital Status:  Divorced    Spouse Name: N/A    Number of Children: N/A  . Years of Education: N/A   Occupational History  . Not on file.   Social History Main Topics  . Smoking status: Current Every Day Smoker -- 0.50 packs/day for 41 years    Types: Cigarettes  . Smokeless tobacco: Never Used  . Alcohol Use: No  . Drug Use: No  . Sexual Activity: Not Currently    Birth Control/ Protection: None, Post-menopausal     Comment: once in 9 months   Other Topics Concern  . Not on file   Social History Narrative  . No narrative on file    Review of systems: The patient specifically denies any chest pain at rest or with exertion, dyspnea at rest, orthopnea, paroxysmal nocturnal dyspnea, syncope, palpitations, focal neurological deficits, intermittent claudication, lower extremity edema, unexplained weight gain, cough, hemoptysis or wheezing.  The patient also  denies abdominal pain, nausea, vomiting, dysphagia, diarrhea, constipation, polyuria, polydipsia, dysuria, hematuria, frequency, urgency, abnormal bleeding or bruising, fever, chills, unexpected weight changes, mood swings, change in skin or hair texture, change in voice quality, auditory or visual problems, allergic reactions or rashes, new musculoskeletal complaints other than usual "aches and pains".   PHYSICAL EXAM BP 122/60  Pulse 84  Resp 16  Ht _0  (1.676 m)  Wt 85.911 kg (189 lb 6.4 oz)  BMI 30.58 kg/m2  General: Alert, oriented x3, no distress Head: no evidence of trauma, PERRL, EOMI, no exophtalmos or lid lag, no myxedema, no xanthelasma; normal ears, nose and oropharynx Neck: normal jugular venous pulsations and no hepatojugular reflux; brisk carotid pulses without delay and no carotid bruits Chest: clear to auscultation, no signs of consolidation by percussion or palpation, normal fremitus, symmetrical and full respiratory excursions Cardiovascular: normal position and quality of the apical impulse, regular rhythm, normal first and second heart sounds, short grade 1/6 systolic ejection murmur and very short barely audible diastolic murmur in the aortic focus, no rubs or gallops Abdomen: no tenderness or distention, no masses by palpation, no abnormal pulsatility or arterial bruits, normal bowel sounds, no hepatosplenomegaly Extremities: no clubbing, cyanosis or edema; 2+ radial, ulnar and brachial pulses bilaterally; 2+ right femoral, posterior tibial and dorsalis pedis pulses; 2+ left femoral, posterior tibial and dorsalis pedis pulses; no subclavian or femoral bruits Neurological: grossly nonfocal   EKG: Normal sinus rhythm, normal tracing (specific T wave inversion in lead aVL only)  Lipid Panel  Total cholesterol 154, triglyceride 161, HDL 50, LDL 72 (every 2014) normal LFTs, creatinine 0.9 (February 2014) TSH was normal in 2012, hemoglobin 13.9 in 2012   ASSESSMENT AND  PLAN CAD (coronary artery disease) Her symptoms do not sound suggestive of coronary insufficiency and revascularization would not be indicated in the absence of angina. We'll check an echo to see left ventricular systolic function and reevaluate the aortic insufficiency. Will need repeat risk stratification for CAD if there is a drop in LVEF. She is on appropriate lipid lowering treatment and has an excellent blood pressure.   Orders Placed This Encounter  Procedures  . Lipid Profile  . Comp Met (CMET)  . EKG 12-Lead  . 2D Echocardiogram without contrast   Meds ordered this encounter  Medications  . esomeprazole (NEXIUM) 40 MG capsule    Sig: Take 40 mg by mouth daily at 12 noon.    Holli Humbles, MD, [place] [phone] office [phone] pager

## 2013-02-22 NOTE — Patient Instructions (Signed)
Your physician has requested that you have an echocardiogram. Echocardiography is a painless test that uses sound waves to create images of your heart. It provides your doctor with information about the size and shape of your heart and how well your heart's chambers and valves are working. This procedure takes approximately one hour. There are no restrictions for this procedure.  Your physician recommends that you return for lab work in: [place] at your convenience at [facility].  Your physician recommends that you schedule a follow-up appointment in: ONE YEAR.

## 2013-02-24 ENCOUNTER — Ambulatory Visit: Payer: Medicaid Other | Admitting: Cardiovascular Disease

## 2013-03-01 ENCOUNTER — Ambulatory Visit (HOSPITAL_COMMUNITY)
Admission: RE | Admit: 2013-03-01 | Discharge: 2013-03-01 | Disposition: A | Discharge status: Home / Self Care | Payer: Medicaid Other | Source: Ambulatory Visit | Attending: Cardiovascular Disease | Admitting: Cardiovascular Disease

## 2013-03-01 DIAGNOSIS — E785 Hyperlipidemia, unspecified: Secondary | ICD-10-CM | POA: Insufficient documentation

## 2013-03-01 DIAGNOSIS — I2510 Atherosclerotic heart disease of native coronary artery without angina pectoris: Secondary | ICD-10-CM | POA: Insufficient documentation

## 2013-03-01 DIAGNOSIS — I350 Nonrheumatic aortic (valve) stenosis: Secondary | ICD-10-CM

## 2013-03-01 DIAGNOSIS — R0602 Shortness of breath: Secondary | ICD-10-CM

## 2013-03-01 DIAGNOSIS — I359 Nonrheumatic aortic valve disorder, unspecified: Secondary | ICD-10-CM | POA: Insufficient documentation

## 2013-03-01 NOTE — Progress Notes (Signed)
2D Echo Performed 03/01/2013    Marygrace Drought, RCS

## 2013-03-17 ENCOUNTER — Ambulatory Visit: Payer: Medicaid Other | Admitting: Cardiovascular Disease

## 2013-03-18 ENCOUNTER — Other Ambulatory Visit: Payer: Self-pay | Admitting: *Deleted

## 2013-03-18 MED ORDER — ISOSORBIDE MONONITRATE ER 30 MG PO TB24: 30.0000 mg | ORAL_TABLET | Freq: Every day | ORAL | Status: DC

## 2013-03-18 MED ORDER — ROSUVASTATIN CALCIUM 20 MG PO TABS: 20.0000 mg | ORAL_TABLET | Freq: Every day | ORAL | Status: DC

## 2013-04-01 LAB — COMPREHENSIVE METABOLIC PANEL
ALBUMIN: 4.1 g/dL (ref 3.5–5.2)
ALT: 21 U/L (ref 0–35)
AST: 20 U/L (ref 0–37)
Alkaline Phosphatase: 46 U/L (ref 39–117)
BUN: 11 mg/dL (ref 6–23)
CO2: 30 meq/L (ref 19–32)
Calcium: 9.9 mg/dL (ref 8.4–10.5)
Chloride: 106 mEq/L (ref 96–112)
Creat: 0.8 mg/dL (ref 0.50–1.10)
GLUCOSE: 94 mg/dL (ref 70–99)
POTASSIUM: 4.2 meq/L (ref 3.5–5.3)
SODIUM: 141 meq/L (ref 135–145)
TOTAL PROTEIN: 6.5 g/dL (ref 6.0–8.3)
Total Bilirubin: 0.5 mg/dL (ref 0.2–1.2)

## 2013-04-01 LAB — LIPID PANEL
Cholesterol: 248 mg/dL — ABNORMAL HIGH (ref 0–200)
HDL: 58 mg/dL (ref >39)
LDL Cholesterol: 154 mg/dL — ABNORMAL HIGH (ref 0–99)
Total CHOL/HDL Ratio: 4.3 Ratio
Triglycerides: 178 mg/dL — ABNORMAL HIGH (ref <150)
VLDL: 36 mg/dL (ref 0–40)

## 2013-04-04 ENCOUNTER — Telehealth: Payer: Self-pay | Admitting: *Deleted

## 2013-04-04 DIAGNOSIS — E782 Mixed hyperlipidemia: Secondary | ICD-10-CM

## 2013-04-04 DIAGNOSIS — Z79899 Other long term (current) drug therapy: Secondary | ICD-10-CM

## 2013-04-04 MED ORDER — ROSUVASTATIN CALCIUM 20 MG PO TABS: 20.0000 mg | ORAL_TABLET | Freq: Every day | ORAL | Status: DC

## 2013-04-04 NOTE — Telephone Encounter (Signed)
Message copied by Tressa Busman on Mon Apr 04, 2013  5:33 PM ------      Message from: Sanda Klein      Created: Mon Apr 04, 2013 12:52 PM       Cholesterol surprisingly high - did she stop the Crestor? ------

## 2013-04-04 NOTE — Telephone Encounter (Signed)
Message copied by Tressa Busman on Mon Apr 04, 2013  5:17 PM ------      Message from: Sanda Klein      Created: Mon Apr 04, 2013 12:52 PM       Cholesterol surprisingly high - did she stop the Crestor? ------

## 2013-04-05 NOTE — Telephone Encounter (Signed)
Patient notified [employer] has approved Crestor 20mg  qd.  Refill done to pharmacy.

## 2013-05-25 ENCOUNTER — Other Ambulatory Visit: Payer: Self-pay | Admitting: Internal Medicine

## 2013-05-25 ENCOUNTER — Ambulatory Visit (INDEPENDENT_AMBULATORY_CARE_PROVIDER_SITE_OTHER): Payer: Medicaid Other | Admitting: Gastroenterology

## 2013-05-25 ENCOUNTER — Encounter (INDEPENDENT_AMBULATORY_CARE_PROVIDER_SITE_OTHER): Payer: Self-pay

## 2013-05-25 ENCOUNTER — Encounter: Payer: Self-pay | Admitting: Gastroenterology

## 2013-05-25 VITALS — BP 108/67 | HR 87 | Temp 97.6°F | Ht 66.0 in | Wt 183.8 lb

## 2013-05-25 DIAGNOSIS — Z1211 Encounter for screening for malignant neoplasm of colon: Secondary | ICD-10-CM

## 2013-05-25 DIAGNOSIS — K5900 Constipation, unspecified: Secondary | ICD-10-CM

## 2013-05-25 DIAGNOSIS — K219 Gastro-esophageal reflux disease without esophagitis: Secondary | ICD-10-CM | POA: Insufficient documentation

## 2013-05-25 MED ORDER — LINACLOTIDE 145 MCG PO CAPS: 145.0000 ug | ORAL_CAPSULE | Freq: Every day | ORAL | Status: DC

## 2013-05-25 MED ORDER — ESOMEPRAZOLE MAGNESIUM 40 MG PO CPDR: 40.0000 mg | DELAYED_RELEASE_CAPSULE | Freq: Every day | ORAL | Status: DC

## 2013-05-25 MED ORDER — PEG-KCL-NACL-NASULF-NA ASC-C 100 G PO SOLR: 1.0000 | ORAL | Status: DC

## 2013-05-25 NOTE — Assessment & Plan Note (Signed)
Start Linzess 145 mcg daily. Voucher and prescription provided.

## 2013-05-25 NOTE — Assessment & Plan Note (Signed)
Tried and failed Prilosec, Protonix. Nexium OTC does best. Will send in prescription Nexium. Discussed lifestyle and dietary modifications.

## 2013-05-25 NOTE — Assessment & Plan Note (Signed)
58 year old female with need for initial screening colonoscopy without any concerning lower GI symptoms. Chronic constipation likely secondary to narcotic use. See constipation.  Proceed with TCS with Dr. Gala Romney in near future: the risks, benefits, and alternatives have been discussed with the patient in detail. The patient states understanding and desires to proceed. Phenergan 25 mg IV on call due to polypharmacy

## 2013-05-25 NOTE — Patient Instructions (Signed)
For constipation: Start taking Linzess 1 capsule each morning on an empty stomach, at least 30 minutes before breakfast. I have provided a voucher and prescription.  For reflux: Start taking Nexium prescription each morning, 30 minutes before breakfast. I have sent this to the pharmacy.  We have scheduled you for a colonoscopy with Dr. Gala Romney in the near future.   Diet for Gastroesophageal Reflux Disease, Adult Reflux (acid reflux) is when acid from your stomach flows up into the esophagus. When acid comes in contact with the esophagus, the acid causes irritation and soreness (inflammation) in the esophagus. When reflux happens often or so severely that it causes damage to the esophagus, it is called gastroesophageal reflux disease (GERD). Nutrition therapy can help ease the discomfort of GERD. FOODS OR DRINKS TO AVOID OR LIMIT  Smoking or chewing tobacco. Nicotine is one of the most potent stimulants to acid production in the gastrointestinal tract.  Caffeinated and decaffeinated coffee and black tea.  Regular or low-calorie carbonated beverages or energy drinks (caffeine-free carbonated beverages are allowed).   Strong spices, such as black pepper, white pepper, red pepper, cayenne, curry powder, and chili powder.  Peppermint or spearmint.  Chocolate.  High-fat foods, including meats and fried foods. Extra added fats including oils, butter, salad dressings, and nuts. Limit these to less than 8 tsp per day.  Fruits and vegetables if they are not tolerated, such as citrus fruits or tomatoes.  Alcohol.  Any food that seems to aggravate your condition. If you have questions regarding your diet, call your caregiver or a registered dietitian. OTHER THINGS THAT MAY HELP GERD INCLUDE:   Eating your meals slowly, in a relaxed setting.  Eating 5 to 6 small meals per day instead of 3 large meals.  Eliminating food for a period of time if it causes distress.  Not lying down until 3  hours after eating a meal.  Keeping the head of your bed raised 6 to 9 inches (15 to 23 cm) by using a foam wedge or blocks under the legs of the bed. Lying flat may make symptoms worse.  Being physically active. Weight loss may be helpful in reducing reflux in overweight or obese adults.  Wear loose fitting clothing EXAMPLE MEAL PLAN This meal plan is approximately 2,000 calories based on [email or web address] meal planning guidelines. Breakfast   cup cooked oatmeal.  1 cup strawberries.  1 cup low-fat milk.  1 oz almonds. Snack  1 cup cucumber slices.  6 oz yogurt (made from low-fat or fat-free milk). Lunch  2 slice whole-wheat bread.  2 oz sliced [place].  2 tsp mayonnaise.  1 cup blueberries.  1 cup snap peas. Snack  6 whole-wheat crackers.  1 oz string cheese. Dinner   cup brown rice.  1 cup mixed veggies.  1 tsp olive oil.  3 oz grilled fish. Document Released: 01/06/2005 Document Revised: 03/31/2011 Document Reviewed: 11/22/2010 [facility] Patient Information 2014 [place], [place].

## 2013-05-25 NOTE — Progress Notes (Signed)
Referring Provider: Redmond School, MD Primary Care Physician:  Glo Herring., MD Primary Gastroenterologist:  Dr. Gala Romney   Chief Complaint  Patient presents with  . Colonoscopy    HPI:   Debra Graves presents today at the request of Dr. Gerarda Fraction for a screening colonoscopy. No abdominal pain, rectal bleeding. Has to take several stool softeners a day, still with hard stool. Would like to try a prescription medication. BM every few days sometimes. Has hx of GERD but takes Nexium OTC. Woke up twice at night feeling strangled with nocturnal reflux. This occurred after eating in the middle of the night. Not happened recently. Tried and failed Prilosec and Protonix. No unexplained weight loss or lack of appetite.   Past Medical History  Diagnosis Date  . Coronary artery disease   . Mental disorder     anxiety,depression  . Arthritis   . Hypercholesteremia   . Fibroids   . Anxiety   . Depression   . Rectocele 04/14/2012  . Constipation 04/14/2012  . CAD (coronary artery disease) 02/22/2013  . Aortic insufficiency 02/22/2013  . Back pain     bone spurs    Past Surgical History  Procedure Laterality Date  . Cardiac catheterization  03/08/2004    mild progressive CAD    Current Outpatient Prescriptions  Medication Sig Dispense Refill  . aspirin 81 MG tablet Take 81 mg by mouth daily.      . clonazePAM (KLONOPIN) 0.5 MG tablet Take 0.5 mg by mouth daily.      . diclofenac (VOLTAREN) 50 MG EC tablet Take 50 mg by mouth 2 (two) times daily.      [name] Calcium (STOOL SOFTENER PO) Take 2 tablets by mouth daily.      . DULoxetine (CYMBALTA) 60 MG capsule Take 60 mg by mouth daily.      . fish oil-omega-3 fatty acids 1000 MG capsule Take 2 g by mouth daily.      Marland Kitchen HYDROcodone-acetaminophen (NORCO/VICODIN) 5-325 MG per tablet Take 1 tablet by mouth every 4 (four) hours as needed for pain.      . isosorbide mononitrate (IMDUR) 30 MG 24 hr tablet Take 1 tablet (30 mg total)  by mouth daily.  30 tablet  6  . LORazepam (ATIVAN) 1 MG tablet Take 1 mg by mouth every 6 (six) hours as needed for anxiety.      . Multiple Vitamin (MULTIVITAMIN) capsule Take 1 capsule by mouth daily.      . rosuvastatin (CRESTOR) 20 MG tablet Take 1 tablet (20 mg total) by mouth daily.  30 tablet  11  . esomeprazole (NEXIUM) 40 MG capsule Take 1 capsule (40 mg total) by mouth daily before breakfast.  30 capsule  5   No current facility-administered medications for this visit.    Allergies as of 05/25/2013 - Review Complete 05/25/2013  Allergen Reaction Noted  . Albuterol  04/14/2012  . Bactrim [sulfamethoxazole-trimethoprim]  04/14/2012  . Vytorin [ezetimibe-simvastatin]  03/25/2013    Family History  Problem Relation Age of Onset  . Cancer Mother 31    Breast then bones and all over  . Coronary artery disease Father   . Heart disease Father   . Stroke Father   . Cancer Sister 45    breast  . Hypertension Sister   . Stroke Brother   . Coronary artery disease Brother   . Coronary artery disease Brother   . Coronary artery disease Brother   .  Heart disease Sister     mi  . Colon cancer Neg Hx     History   Social History  . Marital Status: Divorced    Spouse Name: N/A    Number of Children: N/A  . Years of Education: N/A   Occupational History  . disability    Social History Main Topics  . Smoking status: Current Every Day Smoker -- 0.50 packs/day for 41 years    Types: Cigarettes  . Smokeless tobacco: Never Used  . Alcohol Use: No  . Drug Use: No  . Sexual Activity: Not Currently    Birth Control/ Protection: None, Post-menopausal     Comment: once in 9 months   Other Topics Concern  . Not on file   Social History Narrative  . No narrative on file    Review of Systems: As mentioned in HPI  Physical Exam: BP 108/67  Pulse 87  Temp(Src) 97.6 F (36.4 C) (Oral)  Ht 5\' 6"  (1.676 m)  Wt 183 lb 12.8 oz (83.371 kg)  BMI 29.68 kg/m2 General:    Alert and oriented. Well-developed, well-nourished, pleasant and cooperative. Head:  Normocephalic and atraumatic. Eyes:  Conjunctiva pink, sclera clear, no icterus.   Conjunctiva pink. Ears:  Normal auditory acuity. Nose:  No deformity, discharge,  or lesions. Mouth:  No deformity or lesions, mucosa pink and moist.  Lungs:  Clear to auscultation bilaterally, without wheezing, rales, or rhonchi.  Heart:  S1, S2 present without murmurs noted.  Abdomen:  +BS, soft, non-tender and non-distended. Without mass or HSM. No rebound or guarding. No hernias noted. Rectal:  Deferred  Msk:  Symmetrical without gross deformities. Normal posture. Extremities:  Without clubbing or edema. Neurologic:  Alert and  oriented x4;  grossly normal neurologically. Skin:  Intact, warm and dry without significant lesions or rashes Psych:  Alert and cooperative. Normal mood and affect.

## 2013-05-27 ENCOUNTER — Encounter (HOSPITAL_COMMUNITY): Payer: Self-pay | Admitting: Pharmacy Technician

## 2013-06-08 ENCOUNTER — Telehealth: Payer: Self-pay

## 2013-06-08 NOTE — Telephone Encounter (Signed)
PA for dexilant has been approved x 1 year.

## 2013-06-09 NOTE — Telephone Encounter (Signed)
Correction- nexium has been approved x 1 year.

## 2013-06-15 ENCOUNTER — Telehealth: Payer: Self-pay | Admitting: *Deleted

## 2013-06-15 NOTE — Telephone Encounter (Signed)
Spoke to patient and answered her questions regarding morning medications

## 2013-06-15 NOTE — Telephone Encounter (Signed)
Pt called about taking her morning medication per pt she takes asprin, a heart pill, and nexium she takes them with apple sauce. Please advise [phone]. Pt's TCS is tomorrow.

## 2013-06-16 ENCOUNTER — Encounter (HOSPITAL_COMMUNITY)
Admission: RE | Disposition: A | Discharge status: Home / Self Care | Payer: Self-pay | Source: Ambulatory Visit | Attending: Internal Medicine

## 2013-06-16 ENCOUNTER — Ambulatory Visit (HOSPITAL_COMMUNITY)
Admission: RE | Admit: 2013-06-16 | Discharge: 2013-06-16 | Disposition: A | Discharge status: Home / Self Care | Payer: Medicaid Other | Source: Ambulatory Visit | Attending: Internal Medicine | Admitting: Internal Medicine

## 2013-06-16 ENCOUNTER — Encounter (HOSPITAL_COMMUNITY): Payer: Self-pay

## 2013-06-16 DIAGNOSIS — Z7982 Long term (current) use of aspirin: Secondary | ICD-10-CM | POA: Insufficient documentation

## 2013-06-16 DIAGNOSIS — D129 Benign neoplasm of anus and anal canal: Secondary | ICD-10-CM

## 2013-06-16 DIAGNOSIS — Z79899 Other long term (current) drug therapy: Secondary | ICD-10-CM | POA: Insufficient documentation

## 2013-06-16 DIAGNOSIS — F411 Generalized anxiety disorder: Secondary | ICD-10-CM | POA: Insufficient documentation

## 2013-06-16 DIAGNOSIS — Z1211 Encounter for screening for malignant neoplasm of colon: Secondary | ICD-10-CM | POA: Insufficient documentation

## 2013-06-16 DIAGNOSIS — K648 Other hemorrhoids: Secondary | ICD-10-CM | POA: Insufficient documentation

## 2013-06-16 DIAGNOSIS — E7800 Pure hypercholesterolemia, unspecified: Secondary | ICD-10-CM | POA: Insufficient documentation

## 2013-06-16 DIAGNOSIS — K649 Unspecified hemorrhoids: Secondary | ICD-10-CM

## 2013-06-16 DIAGNOSIS — K6389 Other specified diseases of intestine: Secondary | ICD-10-CM

## 2013-06-16 DIAGNOSIS — D128 Benign neoplasm of rectum: Secondary | ICD-10-CM | POA: Insufficient documentation

## 2013-06-16 DIAGNOSIS — F329 Major depressive disorder, single episode, unspecified: Secondary | ICD-10-CM | POA: Insufficient documentation

## 2013-06-16 DIAGNOSIS — Z86010 Personal history of colonic polyps: Secondary | ICD-10-CM

## 2013-06-16 DIAGNOSIS — D126 Benign neoplasm of colon, unspecified: Secondary | ICD-10-CM

## 2013-06-16 DIAGNOSIS — F3289 Other specified depressive episodes: Secondary | ICD-10-CM | POA: Insufficient documentation

## 2013-06-16 DIAGNOSIS — I2510 Atherosclerotic heart disease of native coronary artery without angina pectoris: Secondary | ICD-10-CM | POA: Insufficient documentation

## 2013-06-16 HISTORY — PX: COLONOSCOPY: SHX5424

## 2013-06-16 SURGERY — COLONOSCOPY
Anesthesia: Moderate Sedation

## 2013-06-16 MED ORDER — MEPERIDINE HCL 100 MG/ML IJ SOLN
INTRAMUSCULAR | Status: AC
  Filled 2013-06-16: qty 2

## 2013-06-16 MED ORDER — SODIUM CHLORIDE 0.9 % IV SOLN
INTRAVENOUS | Status: DC
  Administered 2013-06-16: 08:00:00 via INTRAVENOUS

## 2013-06-16 MED ORDER — MIDAZOLAM HCL 5 MG/5ML IJ SOLN
INTRAMUSCULAR | Status: AC
  Filled 2013-06-16: qty 10

## 2013-06-16 MED ORDER — MIDAZOLAM HCL 5 MG/5ML IJ SOLN
INTRAMUSCULAR | Status: DC | PRN
  Administered 2013-06-16 (×2): 1 mg via INTRAVENOUS
  Administered 2013-06-16 (×2): 2 mg via INTRAVENOUS

## 2013-06-16 MED ORDER — ONDANSETRON HCL 4 MG/2ML IJ SOLN
INTRAMUSCULAR | Status: AC
  Filled 2013-06-16: qty 2

## 2013-06-16 MED ORDER — SODIUM CHLORIDE 0.9 % IJ SOLN
INTRAMUSCULAR | Status: AC
  Filled 2013-06-16: qty 10

## 2013-06-16 MED ORDER — PROMETHAZINE HCL 25 MG/ML IJ SOLN
25.0000 mg | Freq: Once | INTRAMUSCULAR | Status: AC
  Administered 2013-06-16: 25 mg via INTRAVENOUS

## 2013-06-16 MED ORDER — PROMETHAZINE HCL 25 MG/ML IJ SOLN
INTRAMUSCULAR | Status: AC
Start: 2013-06-16 — End: 2013-06-16
  Filled 2013-06-16: qty 1

## 2013-06-16 MED ORDER — MEPERIDINE HCL 100 MG/ML IJ SOLN
INTRAMUSCULAR | Status: DC | PRN
  Administered 2013-06-16: 50 mg via INTRAVENOUS
  Administered 2013-06-16 (×2): 25 mg via INTRAVENOUS

## 2013-06-16 MED ORDER — STERILE WATER FOR IRRIGATION IR SOLN
Status: DC | PRN
  Administered 2013-06-16: 08:00:00

## 2013-06-16 MED ORDER — ONDANSETRON HCL 4 MG/2ML IJ SOLN
INTRAMUSCULAR | Status: DC | PRN
  Administered 2013-06-16: 4 mg via INTRAVENOUS

## 2013-06-16 NOTE — Interval H&P Note (Signed)
History and Physical Interval Note:  06/16/2013 8:09 AM  Debra Graves  has presented today for surgery, with the diagnosis of SCREENING COLONOSCOPY  The various methods of treatment have been discussed with the patient and family. After consideration of risks, benefits and other options for treatment, the patient has consented to  Procedure(s) with comments: COLONOSCOPY (N/A) - 8:00 as a surgical intervention .  The patient's history has been reviewed, patient examined, no change in status, stable for surgery.  I have reviewed the patient's chart and labs.  Questions were answered to the patient's satisfaction.     No change. Colonoscopy per plan.The risks, benefits, limitations, alternatives and imponderables have been reviewed with the patient. Questions have been answered. All parties are agreeable.   Cristopher Estimable Glenford Garis

## 2013-06-16 NOTE — H&P (View-Only) (Signed)
Referring Provider: Redmond School, MD Primary Care Physician:  Glo Herring., MD Primary Gastroenterologist:  Dr. Gala Romney   Chief Complaint  Patient presents with  . Colonoscopy    HPI:   Debra Graves presents today at the request of Dr. Gerarda Fraction for a screening colonoscopy. No abdominal pain, rectal bleeding. Has to take several stool softeners a day, still with hard stool. Would like to try a prescription medication. BM every few days sometimes. Has hx of GERD but takes Nexium OTC. Woke up twice at night feeling strangled with nocturnal reflux. This occurred after eating in the middle of the night. Not happened recently. Tried and failed Prilosec and Protonix. No unexplained weight loss or lack of appetite.   Past Medical History  Diagnosis Date  . Coronary artery disease   . Mental disorder     anxiety,depression  . Arthritis   . Hypercholesteremia   . Fibroids   . Anxiety   . Depression   . Rectocele 04/14/2012  . Constipation 04/14/2012  . CAD (coronary artery disease) 02/22/2013  . Aortic insufficiency 02/22/2013  . Back pain     bone spurs    Past Surgical History  Procedure Laterality Date  . Cardiac catheterization  03/08/2004    mild progressive CAD    Current Outpatient Prescriptions  Medication Sig Dispense Refill  . aspirin 81 MG tablet Take 81 mg by mouth daily.      . clonazePAM (KLONOPIN) 0.5 MG tablet Take 0.5 mg by mouth daily.      . diclofenac (VOLTAREN) 50 MG EC tablet Take 50 mg by mouth 2 (two) times daily.      [name] Calcium (STOOL SOFTENER PO) Take 2 tablets by mouth daily.      . DULoxetine (CYMBALTA) 60 MG capsule Take 60 mg by mouth daily.      . fish oil-omega-3 fatty acids 1000 MG capsule Take 2 g by mouth daily.      Marland Kitchen HYDROcodone-acetaminophen (NORCO/VICODIN) 5-325 MG per tablet Take 1 tablet by mouth every 4 (four) hours as needed for pain.      . isosorbide mononitrate (IMDUR) 30 MG 24 hr tablet Take 1 tablet (30 mg total)  by mouth daily.  30 tablet  6  . LORazepam (ATIVAN) 1 MG tablet Take 1 mg by mouth every 6 (six) hours as needed for anxiety.      . Multiple Vitamin (MULTIVITAMIN) capsule Take 1 capsule by mouth daily.      . rosuvastatin (CRESTOR) 20 MG tablet Take 1 tablet (20 mg total) by mouth daily.  30 tablet  11  . esomeprazole (NEXIUM) 40 MG capsule Take 1 capsule (40 mg total) by mouth daily before breakfast.  30 capsule  5   No current facility-administered medications for this visit.    Allergies as of 05/25/2013 - Review Complete 05/25/2013  Allergen Reaction Noted  . Albuterol  04/14/2012  . Bactrim [sulfamethoxazole-trimethoprim]  04/14/2012  . Vytorin [ezetimibe-simvastatin]  03/25/2013    Family History  Problem Relation Age of Onset  . Cancer Mother 31    Breast then bones and all over  . Coronary artery disease Father   . Heart disease Father   . Stroke Father   . Cancer Sister 45    breast  . Hypertension Sister   . Stroke Brother   . Coronary artery disease Brother   . Coronary artery disease Brother   . Coronary artery disease Brother   .  Heart disease Sister     mi  . Colon cancer Neg Hx     History   Social History  . Marital Status: Divorced    Spouse Name: N/A    Number of Children: N/A  . Years of Education: N/A   Occupational History  . disability    Social History Main Topics  . Smoking status: Current Every Day Smoker -- 0.50 packs/day for 41 years    Types: Cigarettes  . Smokeless tobacco: Never Used  . Alcohol Use: No  . Drug Use: No  . Sexual Activity: Not Currently    Birth Control/ Protection: None, Post-menopausal     Comment: once in 9 months   Other Topics Concern  . Not on file   Social History Narrative  . No narrative on file    Review of Systems: As mentioned in HPI  Physical Exam: BP 108/67  Pulse 87  Temp(Src) 97.6 F (36.4 C) (Oral)  Ht 5\' 6"  (1.676 m)  Wt 183 lb 12.8 oz (83.371 kg)  BMI 29.68 kg/m2 General:    Alert and oriented. Well-developed, well-nourished, pleasant and cooperative. Head:  Normocephalic and atraumatic. Eyes:  Conjunctiva pink, sclera clear, no icterus.   Conjunctiva pink. Ears:  Normal auditory acuity. Nose:  No deformity, discharge,  or lesions. Mouth:  No deformity or lesions, mucosa pink and moist.  Lungs:  Clear to auscultation bilaterally, without wheezing, rales, or rhonchi.  Heart:  S1, S2 present without murmurs noted.  Abdomen:  +BS, soft, non-tender and non-distended. Without mass or HSM. No rebound or guarding. No hernias noted. Rectal:  Deferred  Msk:  Symmetrical without gross deformities. Normal posture. Extremities:  Without clubbing or edema. Neurologic:  Alert and  oriented x4;  grossly normal neurologically. Skin:  Intact, warm and dry without significant lesions or rashes Psych:  Alert and cooperative. Normal mood and affect.

## 2013-06-16 NOTE — Discharge Instructions (Addendum)
°Colonoscopy °Discharge Instructions ° °Read the instructions outlined below and refer to this sheet in the next few weeks. These discharge instructions provide you with general information on caring for yourself after you leave the hospital. Your doctor may also give you specific instructions. While your treatment has been planned according to the most current medical practices available, unavoidable complications occasionally occur. If you have any problems or questions after discharge, call Dr. Rourk at [phone]. °ACTIVITY °· You may resume your regular activity, but move at a slower pace for the next 24 hours.  °· Take frequent rest periods for the next 24 hours.  °· Walking will help get rid of the air and reduce the bloated feeling in your belly (abdomen).  °· No driving for 24 hours (because of the medicine (anesthesia) used during the test).   °· Do not sign any important legal documents or operate any machinery for 24 hours (because of the anesthesia used during the test).  °NUTRITION °· Drink plenty of fluids.  °· You may resume your normal diet as instructed by your doctor.  °· Begin with a light meal and progress to your normal diet. Heavy or fried foods are harder to digest and may make you feel sick to your stomach (nauseated).  °· Avoid alcoholic beverages for 24 hours or as instructed.  °MEDICATIONS °· You may resume your normal medications unless your doctor tells you otherwise.  °WHAT YOU CAN EXPECT TODAY °· Some feelings of bloating in the abdomen.  °· Passage of more gas than usual.  °· Spotting of blood in your stool or on the toilet paper.  °IF YOU HAD POLYPS REMOVED DURING THE COLONOSCOPY: °· No aspirin products for 7 days or as instructed.  °· No alcohol for 7 days or as instructed.  °· Eat a soft diet for the next 24 hours.  °FINDING OUT THE RESULTS OF YOUR TEST °Not all test results are available during your visit. If your test results are not back during the visit, make an appointment  with your caregiver to find out the results. Do not assume everything is normal if you have not heard from your caregiver or the medical facility. It is important for you to follow up on all of your test results.  °SEEK IMMEDIATE MEDICAL ATTENTION IF: °· You have more than a spotting of blood in your stool.  °· Your belly is swollen (abdominal distention).  °· You are nauseated or vomiting.  °· You have a temperature over 101.  °· You have abdominal pain or discomfort that is severe or gets worse throughout the day.  ° °Polyp information provided ° °Further recommendations to follow pending review of pathology report ° °Colon Polyps °Polyps are lumps of extra tissue growing inside the body. Polyps can grow in the large intestine (colon). Most colon polyps are noncancerous (benign). However, some colon polyps can become cancerous over time. Polyps that are larger than a pea may be harmful. To be safe, caregivers remove and test all polyps. °CAUSES  °Polyps form when mutations in the genes cause your cells to grow and divide even though no more tissue is needed. °RISK FACTORS °There are a number of risk factors that can increase your chances of getting colon polyps. They include: °· Being older than 50 years. °· Family history of colon polyps or colon cancer. °· Long-term colon diseases, such as colitis or Crohn disease. °· Being overweight. °· Smoking. °· Being inactive. °· Drinking too much alcohol. °SYMPTOMS  °  Most small polyps do not cause symptoms. If symptoms are present, they may include: °· Blood in the stool. The stool may look dark red or black. °· Constipation or diarrhea that lasts longer than 1 week. °DIAGNOSIS °People often do not know they have polyps until their caregiver finds them during a regular checkup. Your caregiver can use 4 tests to check for polyps: °· Digital rectal exam. The caregiver wears gloves and feels inside the rectum. This test would find polyps only in the rectum. °· Barium enema.  The caregiver puts a liquid called barium into your rectum before taking X-rays of your colon. Barium makes your colon look white. Polyps are dark, so they are easy to see in the X-ray pictures. °· Sigmoidoscopy. A thin, flexible tube (sigmoidoscope) is placed into your rectum. The sigmoidoscope has a light and tiny camera in it. The caregiver uses the sigmoidoscope to look at the last third of your colon. °· Colonoscopy. This test is like sigmoidoscopy, but the caregiver looks at the entire colon. This is the most common method for finding and removing polyps. °TREATMENT  °Any polyps will be removed during a sigmoidoscopy or colonoscopy. The polyps are then tested for cancer. °PREVENTION  °To help lower your risk of getting more colon polyps: °· Eat plenty of fruits and vegetables. Avoid eating fatty foods. °· Do not smoke. °· Avoid drinking alcohol. °· Exercise every day. °· Lose weight if recommended by your caregiver. °· Eat plenty of calcium and folate. Foods that are rich in calcium include milk, cheese, and broccoli. Foods that are rich in folate include chickpeas, kidney beans, and spinach. °HOME CARE INSTRUCTIONS °Keep all follow-up appointments as directed by your caregiver. You may need periodic exams to check for polyps. °SEEK MEDICAL CARE IF: °You notice bleeding during a bowel movement. °Document Released: 10/03/2003 Document Revised: 03/31/2011 Document Reviewed: 03/18/2011 °[employer]® Patient Information ©2014 [employer]. ° °

## 2013-06-16 NOTE — Op Note (Signed)
[facility] [address] [place], [address]   COLONOSCOPY PROCEDURE REPORT  PATIENT: Debra Graves, Debra Graves  MR#:         [id number] BIRTHDATE: Nov 15, 1955 , 57  yrs. old GENDER: Female ENDOSCOPIST: R.  Garfield Cornea, MD FACP FACG REFERRED BY:  Kerin Perna, M.D. PROCEDURE DATE:  06/16/2013 PROCEDURE:     Ileocolonoscopy with biopsy  INDICATIONS: First-ever average risk screening examination  INFORMED CONSENT:  The risks, benefits, alternatives and imponderables including but not limited to bleeding, perforation as well as the possibility of a missed lesion have been reviewed.  The potential for biopsy, lesion removal, etc. have also been discussed.  Questions have been answered.  All parties agreeable. Please see the history and physical in the medical record for more information.  MEDICATIONS: Versed 6 mg IV and Demerol 100 mg IV in divided doses. Zofran 4 mg IV  DESCRIPTION OF PROCEDURE:  After a digital rectal exam was performed, the EC-[id number] ([id number])  colonoscope was advanced from the anus through the rectum and colon to the area of the cecum, ileocecal valve and appendiceal orifice.  The cecum was deeply intubated.  These structures were well-seen and photographed for the record.  From the level of the cecum and ileocecal valve, the scope was slowly and cautiously withdrawn.  The mucosal surfaces were carefully surveyed utilizing scope tip deflection to facilitate fold flattening as needed.  The scope was pulled down into the rectum where a thorough examination including retroflexion was performed.    FINDINGS:  Adequate preparation. Internal hemorrhoids; (1) diminutive polyp just inside the anal verge; otherwise, the remainder of the rectal mucosa appeared normal.              eSigned:  R. Garfield Cornea, MD FACP [facility] 06/16/2013 8:54 AM   CC:

## 2013-06-17 ENCOUNTER — Encounter (HOSPITAL_COMMUNITY): Payer: Self-pay | Admitting: Internal Medicine

## 2013-06-21 ENCOUNTER — Encounter: Payer: Self-pay | Admitting: Internal Medicine

## 2013-06-22 ENCOUNTER — Other Ambulatory Visit (HOSPITAL_COMMUNITY): Payer: Self-pay | Admitting: Internal Medicine

## 2013-06-22 DIAGNOSIS — Z139 Encounter for screening, unspecified: Secondary | ICD-10-CM

## 2013-06-23 ENCOUNTER — Ambulatory Visit (HOSPITAL_COMMUNITY)
Admission: RE | Admit: 2013-06-23 | Discharge: 2013-06-23 | Disposition: A | Discharge status: Home / Self Care | Payer: Medicaid Other | Source: Ambulatory Visit | Attending: Internal Medicine | Admitting: Internal Medicine

## 2013-06-23 DIAGNOSIS — Z139 Encounter for screening, unspecified: Secondary | ICD-10-CM

## 2013-06-23 DIAGNOSIS — Z803 Family history of malignant neoplasm of breast: Secondary | ICD-10-CM | POA: Insufficient documentation

## 2013-06-23 DIAGNOSIS — Z1231 Encounter for screening mammogram for malignant neoplasm of breast: Secondary | ICD-10-CM | POA: Insufficient documentation

## 2013-07-05 LAB — COMPREHENSIVE METABOLIC PANEL
ALBUMIN: 4 g/dL (ref 3.5–5.2)
ALT: 19 U/L (ref 0–35)
AST: 21 U/L (ref 0–37)
Alkaline Phosphatase: 47 U/L (ref 39–117)
BUN: 14 mg/dL (ref 6–23)
CALCIUM: 9.8 mg/dL (ref 8.4–10.5)
CHLORIDE: 105 meq/L (ref 96–112)
CO2: 29 meq/L (ref 19–32)
CREATININE: 0.83 mg/dL (ref 0.50–1.10)
Glucose, Bld: 99 mg/dL (ref 70–99)
Potassium: 4.5 mEq/L (ref 3.5–5.3)
Sodium: 142 mEq/L (ref 135–145)
Total Bilirubin: 0.5 mg/dL (ref 0.2–1.2)
Total Protein: 6.3 g/dL (ref 6.0–8.3)

## 2013-07-05 LAB — LIPID PANEL
CHOLESTEROL: 171 mg/dL (ref 0–200)
HDL: 56 mg/dL (ref >39)
LDL CALC: 97 mg/dL (ref 0–99)
TRIGLYCERIDES: 91 mg/dL (ref <150)
Total CHOL/HDL Ratio: 3.1 Ratio
VLDL: 18 mg/dL (ref 0–40)

## 2013-07-12 ENCOUNTER — Telehealth: Payer: Self-pay | Admitting: Cardiovascular Disease

## 2013-07-12 NOTE — Telephone Encounter (Signed)
Patient notified results have no been reviewed by will request another provide take a look and result on them.   She request lab results be forwarded to Dr. Gerarda Fraction as well.   Will defer to Mickel Baas, NP to advise/resul on labs from 6/16

## 2013-07-12 NOTE — Telephone Encounter (Signed)
Wants lab results from 07-05-13 please. Also wants a copy sent to Dr Gerarda Fraction at [facility].

## 2013-07-13 NOTE — Telephone Encounter (Signed)
RN called patient with results.   Mickel Baas, NP had CC'ed Dr. Gerarda Fraction on message regarding labs. Will route lab results to him as well.

## 2013-07-13 NOTE — Telephone Encounter (Signed)
The labs never came to my basket, not sure why.  But I looked up and they are all normal.  Please give pt chol. Results.  Keep doing what she is doing.  Also please send copy to Dr. Gerarda Fraction.

## 2013-07-28 NOTE — Telephone Encounter (Signed)
LM with lab results

## 2013-10-13 ENCOUNTER — Other Ambulatory Visit: Payer: Self-pay | Admitting: *Deleted

## 2013-10-13 MED ORDER — ISOSORBIDE MONONITRATE ER 30 MG PO TB24: 30.0000 mg | ORAL_TABLET | Freq: Every day | ORAL | Status: DC

## 2013-12-13 ENCOUNTER — Other Ambulatory Visit: Payer: Self-pay | Admitting: Gastroenterology

## 2014-01-30 ENCOUNTER — Other Ambulatory Visit: Payer: Self-pay | Admitting: Gastroenterology

## 2014-03-08 ENCOUNTER — Encounter: Payer: Self-pay | Admitting: Cardiovascular Disease

## 2014-03-08 ENCOUNTER — Ambulatory Visit (INDEPENDENT_AMBULATORY_CARE_PROVIDER_SITE_OTHER): Payer: Medicaid Other | Admitting: Cardiovascular Disease

## 2014-03-08 VITALS — BP 100/86 | HR 86 | Resp 16 | Ht 66.0 in | Wt 179.0 lb

## 2014-03-08 DIAGNOSIS — E7800 Pure hypercholesterolemia, unspecified: Secondary | ICD-10-CM

## 2014-03-08 DIAGNOSIS — Z79899 Other long term (current) drug therapy: Secondary | ICD-10-CM

## 2014-03-08 DIAGNOSIS — I2510 Atherosclerotic heart disease of native coronary artery without angina pectoris: Secondary | ICD-10-CM

## 2014-03-08 DIAGNOSIS — E785 Hyperlipidemia, unspecified: Secondary | ICD-10-CM

## 2014-03-08 DIAGNOSIS — I351 Nonrheumatic aortic (valve) insufficiency: Secondary | ICD-10-CM

## 2014-03-08 NOTE — Patient Instructions (Addendum)
Dr.Croitoru wants you to follow-up in: ONE YEAR. You will receive a reminder letter in the mail two months in advance. If you don't receive a letter, please call our office to schedule the follow-up appointment.  Your physician recommends that you return for lab work FASTING

## 2014-03-08 NOTE — Progress Notes (Signed)
Patient ID: Debra Graves, female   DOB: 10/21/1955, 59 y.o.   MRN: [id number]     Reason for office visit CAD, aortic insufficiency  Debra Graves is a 59 year old woman with moderate coronary artery disease (60-70% ostial RCA and 30-40% mid LAD) and mild to moderate aortic insufficiency (last echo 2015, normal LV size and systolic function), hyperlipidemia and hypertension.  She has not had any new health challenges since last year. She feels well. Her complaints of fatigue seem less prominent than they were a year ago. She denies exertional dyspnea or angina. She does all her own household chores. She has decided that she'll quit smoking this month.   Allergies  Allergen Reactions  . Albuterol   . Bactrim [Sulfamethoxazole-Trimethoprim]   . Vytorin [Ezetimibe-Simvastatin]     Current Outpatient Prescriptions  Medication Sig Dispense Refill  . aspirin 81 MG tablet Take 81 mg by mouth daily.    . clonazePAM (KLONOPIN) 0.5 MG tablet Take 0.5 mg by mouth daily.    . diclofenac (VOLTAREN) 50 MG EC tablet Take 50 mg by mouth 2 (two) times daily.    [name] Calcium (STOOL SOFTENER PO) Take 2 tablets by mouth daily.    . DULoxetine (CYMBALTA) 60 MG capsule Take 60 mg by mouth daily.    Marland Kitchen esomeprazole (NEXIUM) 40 MG capsule TAKE 1 CAPSULE BY MOUTH DAILY BEFORE BREAKFAST. 30 capsule 5  . fish oil-omega-3 fatty acids 1000 MG capsule Take 2 g by mouth daily.    Marland Kitchen HYDROcodone-acetaminophen (NORCO/VICODIN) 5-325 MG per tablet Take 1 tablet by mouth every 4 (four) hours as needed for pain.    . isosorbide mononitrate (IMDUR) 30 MG 24 hr tablet Take 1 tablet (30 mg total) by mouth daily. 30 tablet 6  . LINZESS 145 MCG CAPS capsule TAKE 1 CAPSULE BY MOUTH 30 MINUTES PRIOR TO BREAKFAST. 30 capsule 5  . LORazepam (ATIVAN) 1 MG tablet Take 1 mg by mouth every 6 (six) hours as needed for anxiety.    . Multiple Vitamin (MULTIVITAMIN) capsule Take 1 capsule by mouth daily.    . rosuvastatin (CRESTOR)  20 MG tablet Take 1 tablet (20 mg total) by mouth daily. 30 tablet 11   No current facility-administered medications for this visit.    Past Medical History  Diagnosis Date  . Coronary artery disease   . Mental disorder     anxiety,depression  . Arthritis   . Hypercholesteremia   . Fibroids   . Anxiety   . Depression   . Rectocele 04/14/2012  . Constipation 04/14/2012  . CAD (coronary artery disease) 02/22/2013  . Aortic insufficiency 02/22/2013  . Back pain     bone spurs    Past Surgical History  Procedure Laterality Date  . Colonoscopy N/A 06/16/2013    Procedure: COLONOSCOPY;  Surgeon: Daneil Dolin, MD;  Location: [facility];  Service: Endoscopy;  Laterality: N/A;  8:00  . Doppler echocardiography  03/20/2010    EF >55 %,trace mitral regurg,mild tricuspid regurg,aortic valve mildly sclerotic'mild to moderate aortic regurg,trace pulmonic valver regurg  . Nm myocar perf wall motion  03/19/06    EF 61%,low risk study  . Cardiac catheterization  03/08/2004    mild progressive CAD  . Carotid duplex  03/09/2007    rt subclavian 0 to 49%,right bulb 0 to 49 %,lf ICA normal    Family History  Problem Relation Age of Onset  . Cancer Mother 39    Breast then bones and all  over  . Coronary artery disease Father   . Heart disease Father   . Stroke Father   . Cancer Sister 30    breast  . Hypertension Sister   . Stroke Brother   . Coronary artery disease Brother   . Coronary artery disease Brother   . Coronary artery disease Brother   . Heart disease Sister     mi  . Colon cancer Neg Hx     History   Social History  . Marital Status: Divorced    Spouse Name: N/A  . Number of Children: N/A  . Years of Education: N/A   Occupational History  . disability    Social History Main Topics  . Smoking status: Current Every Day Smoker -- 0.50 packs/day for 41 years    Types: Cigarettes  . Smokeless tobacco: Never Used  . Alcohol Use: No  . Drug Use: No  . Sexual  Activity: Not Currently    Birth Control/ Protection: None, Post-menopausal     Comment: once in 9 months   Other Topics Concern  . Not on file   Social History Narrative    Review of systems: The patient specifically denies any chest pain at rest or with exertion, dyspnea at rest, orthopnea, paroxysmal nocturnal dyspnea, syncope, palpitations, focal neurological deficits, intermittent claudication, lower extremity edema, unexplained weight gain, cough, hemoptysis or wheezing.  The patient also denies abdominal pain, nausea, vomiting, dysphagia, diarrhea, constipation, polyuria, polydipsia, dysuria, hematuria, frequency, urgency, abnormal bleeding or bruising, fever, chills, unexpected weight changes, mood swings, change in skin or hair texture, change in voice quality, auditory or visual problems, allergic reactions or rashes, new musculoskeletal complaints other than usual "aches and pains".  PHYSICAL EXAM BP 100/86 mmHg  Pulse 86  Resp 16  Ht _0  (1.676 m)  Wt 81.194 kg (179 lb)  BMI 28.91 kg/m2 General: Alert, oriented x3, no distress Head: no evidence of trauma, PERRL, EOMI, no exophtalmos or lid lag, no myxedema, no xanthelasma; normal ears, nose and oropharynx Neck: normal jugular venous pulsations and no hepatojugular reflux; brisk carotid pulses without delay and no carotid bruits Chest: clear to auscultation, no signs of consolidation by percussion or palpation, normal fremitus, symmetrical and full respiratory excursions Cardiovascular: normal position and quality of the apical impulse, regular rhythm, normal first and second heart sounds, short grade 1/6 systolic ejection murmur and very short barely audible diastolic murmur in the aortic focus (only when leaning 40 and full expiration), no rubs or gallops Abdomen: no tenderness or distention, no masses by palpation, no abnormal pulsatility or arterial bruits, normal bowel sounds, no hepatosplenomegaly Extremities: no  clubbing, cyanosis or edema; 2+ radial, ulnar and brachial pulses bilaterally; 2+ right femoral, posterior tibial and dorsalis pedis pulses; 2+ left femoral, posterior tibial and dorsalis pedis pulses; no subclavian or femoral bruits Neurological: grossly nonfocal  EKG: NSR  Lipid Panel     Component Value Date/Time   CHOL 171 07/05/2013 0857   TRIG 91 07/05/2013 0857   HDL 56 07/05/2013 0857   CHOLHDL 3.1 07/05/2013 0857   VLDL 18 07/05/2013 0857   LDLCALC 97 07/05/2013 0857    BMET    Component Value Date/Time   NA 142 07/05/2013 0857   K 4.5 07/05/2013 0857   CL 105 07/05/2013 0857   CO2 29 07/05/2013 0857   GLUCOSE 99 07/05/2013 0857   BUN 14 07/05/2013 0857   CREATININE 0.83 07/05/2013 0857   CALCIUM 9.8 07/05/2013 0857  ASSESSMENT AND PLAN  Mrs. Desantiago has moderate aortic insufficiency that does not appear to have any hemodynamic consequences at this point and is asymptomatic. She has moderate coronary artery disease is also asymptomatic. The focus is on coronary risk factor modification. I'm very please with her decision to quit smoking and strongly encouraged long-term cessation. Gave her a few tips on achieving successful cessation and maintaining it. Her LDL cholesterol increased from 72 in 2014 to 97 in 2015. Reminded her about the importance of daily compliance. We'll recheck her lipid profile. Yearly follow-up. She is to call us if she develops angina or dyspnea on exertion. Orders Placed This Encounter  Procedures  . Lipid Profile  . Comp Met (CMET)  . EKG 12-Lead   No orders of the defined types were placed in this encounter.    Holli Humbles, MD, [place] [phone] office [phone] pager

## 2014-03-09 ENCOUNTER — Encounter: Payer: Self-pay | Admitting: Cardiovascular Disease

## 2014-03-21 LAB — COMPREHENSIVE METABOLIC PANEL
ALK PHOS: 47 U/L (ref 39–117)
ALT: 13 U/L (ref 0–35)
AST: 16 U/L (ref 0–37)
Albumin: 4 g/dL (ref 3.5–5.2)
BILIRUBIN TOTAL: 0.5 mg/dL (ref 0.2–1.2)
BUN: 14 mg/dL (ref 6–23)
CO2: 28 meq/L (ref 19–32)
CREATININE: 0.93 mg/dL (ref 0.50–1.10)
Calcium: 9.7 mg/dL (ref 8.4–10.5)
Chloride: 102 mEq/L (ref 96–112)
Glucose, Bld: 96 mg/dL (ref 70–99)
Potassium: 4.5 mEq/L (ref 3.5–5.3)
Sodium: 140 mEq/L (ref 135–145)
Total Protein: 6.3 g/dL (ref 6.0–8.3)

## 2014-03-21 LAB — LIPID PANEL
CHOLESTEROL: 170 mg/dL (ref 0–200)
HDL: 66 mg/dL (ref >=46)
LDL Cholesterol: 81 mg/dL (ref 0–99)
Total CHOL/HDL Ratio: 2.6 Ratio
Triglycerides: 116 mg/dL (ref <150)
VLDL: 23 mg/dL (ref 0–40)

## 2014-04-08 ENCOUNTER — Other Ambulatory Visit: Payer: Self-pay | Admitting: Cardiovascular Disease

## 2014-04-10 ENCOUNTER — Other Ambulatory Visit: Payer: Self-pay | Admitting: *Deleted

## 2014-04-10 MED ORDER — ROSUVASTATIN CALCIUM 20 MG PO TABS: 20.0000 mg | ORAL_TABLET | Freq: Every day | ORAL | Status: DC

## 2014-04-10 NOTE — Telephone Encounter (Signed)
Rx(s) sent to pharmacy electronically.  

## 2014-04-24 ENCOUNTER — Telehealth: Payer: Self-pay | Admitting: *Deleted

## 2014-04-24 NOTE — Telephone Encounter (Signed)
Prior auth requested from [employer] [facility] for Crestor.  Assured paperwork would be faxed by [facility].  Did not receive will call in AM.

## 2014-04-25 NOTE — Telephone Encounter (Signed)
Crestor approved through [employer] 04/24/2014 through 04/19/2015.  LM at patient's home # about approval and notified pharmacy.

## 2014-06-11 ENCOUNTER — Other Ambulatory Visit: Payer: Self-pay | Admitting: Gastroenterology

## 2014-07-07 ENCOUNTER — Other Ambulatory Visit (HOSPITAL_COMMUNITY): Payer: Self-pay | Admitting: Internal Medicine

## 2014-07-07 DIAGNOSIS — Z1231 Encounter for screening mammogram for malignant neoplasm of breast: Secondary | ICD-10-CM

## 2014-07-12 ENCOUNTER — Ambulatory Visit (HOSPITAL_COMMUNITY)
Admission: RE | Admit: 2014-07-12 | Discharge: 2014-07-12 | Disposition: A | Discharge status: Home / Self Care | Payer: Medicaid Other | Source: Ambulatory Visit | Attending: Internal Medicine | Admitting: Internal Medicine

## 2014-07-12 DIAGNOSIS — Z1231 Encounter for screening mammogram for malignant neoplasm of breast: Secondary | ICD-10-CM | POA: Diagnosis not present

## 2014-08-14 ENCOUNTER — Other Ambulatory Visit: Payer: Self-pay

## 2014-08-15 MED ORDER — LINACLOTIDE 145 MCG PO CAPS: ORAL_CAPSULE | ORAL | Status: DC

## 2014-10-04 ENCOUNTER — Encounter: Payer: Self-pay | Admitting: Internal Medicine

## 2015-01-02 ENCOUNTER — Telehealth: Payer: Self-pay

## 2015-01-02 ENCOUNTER — Encounter: Payer: Self-pay | Admitting: Neurology

## 2015-01-02 ENCOUNTER — Ambulatory Visit (INDEPENDENT_AMBULATORY_CARE_PROVIDER_SITE_OTHER): Payer: Medicaid Other | Admitting: Neurology

## 2015-01-02 VITALS — BP 122/68 | HR 88 | Resp 20 | Ht 66.0 in | Wt 173.0 lb

## 2015-01-02 DIAGNOSIS — F17200 Nicotine dependence, unspecified, uncomplicated: Secondary | ICD-10-CM | POA: Diagnosis not present

## 2015-01-02 DIAGNOSIS — R0683 Snoring: Secondary | ICD-10-CM

## 2015-01-02 DIAGNOSIS — F5105 Insomnia due to other mental disorder: Secondary | ICD-10-CM

## 2015-01-02 DIAGNOSIS — F489 Nonpsychotic mental disorder, unspecified: Secondary | ICD-10-CM

## 2015-01-02 DIAGNOSIS — J441 Chronic obstructive pulmonary disease with (acute) exacerbation: Secondary | ICD-10-CM

## 2015-01-02 MED ORDER — BUPROPION HCL ER (XL) 150 MG PO TB24: 150.0000 mg | ORAL_TABLET | Freq: Every day | ORAL | Status: DC

## 2015-01-02 NOTE — Telephone Encounter (Signed)
Spoke to Dr. Brett Fairy, she recommends a split night instead of an HST for insurance purposes. Split night ordered.

## 2015-01-02 NOTE — Addendum Note (Signed)
Addended by: Lester Bamberg A on: 01/02/2015 05:31 PM   Modules accepted: Orders

## 2015-01-02 NOTE — Telephone Encounter (Signed)
Cyril Mourning, this patient has medicaid, sleep study must be in lab, please change orders

## 2015-01-02 NOTE — Patient Instructions (Signed)
Bupropion tablets (Depression/Mood Disorders) What is this medicine? BUPROPION (byoo PROE pee on) is used to treat depression. This medicine may be used for other purposes; ask your health care provider or pharmacist if you have questions. What should I tell my health care provider before I take this medicine? They need to know if you have any of these conditions: -an eating disorder, such as anorexia or bulimia -bipolar disorder or psychosis -diabetes or high blood sugar, treated with medication -glaucoma -heart disease, previous heart attack, or irregular heart beat -head injury or brain tumor -high blood pressure -kidney or liver disease -seizures -suicidal thoughts or a previous suicide attempt -Tourette's syndrome -weight loss -an unusual or allergic reaction to bupropion, other medicines, foods, dyes, or preservatives -breast-feeding -pregnant or trying to become pregnant How should I use this medicine? Take this medicine by mouth with a glass of water. Follow the directions on the prescription label. You can take it with or without food. If it upsets your stomach, take it with food. Take your medicine at regular intervals. Do not take your medicine more often than directed. Do not stop taking this medicine suddenly except upon the advice of your doctor. Stopping this medicine too quickly may cause serious side effects or your condition may worsen. A special MedGuide will be given to you by the pharmacist with each prescription and refill. Be sure to read this information carefully each time. Talk to your pediatrician regarding the use of this medicine in children. Special care may be needed. Overdosage: If you think you have taken too much of this medicine contact a poison control center or emergency room at once. NOTE: This medicine is only for you. Do not share this medicine with others. What if I miss a dose? If you miss a dose, take it as soon as you can. If it is less than  four hours to your next dose, take only that dose and skip the missed dose. Do not take double or extra doses. What may interact with this medicine? Do not take this medicine with any of the following medications: -linezolid -MAOIs like Azilect, Carbex, Eldepryl, Marplan, Nardil, and Parnate -methylene blue (injected into a vein) -other medicines that contain bupropion like Zyban This medicine may also interact with the following medications: -alcohol -certain medicines for anxiety or sleep -certain medicines for blood pressure like metoprolol, propranolol -certain medicines for depression or psychotic disturbances -certain medicines for HIV or AIDS like efavirenz, lopinavir, nelfinavir, ritonavir -certain medicines for irregular heart beat like propafenone, flecainide -certain medicines for Parkinson's disease like amantadine, levodopa -certain medicines for seizures like carbamazepine, phenytoin, phenobarbital -cimetidine -clopidogrel -cyclophosphamide -furazolidone -isoniazid -nicotine -orphenadrine -procarbazine -steroid medicines like prednisone or cortisone -stimulant medicines for attention disorders, weight loss, or to stay awake -tamoxifen -theophylline -thiotepa -ticlopidine -tramadol -warfarin This list may not describe all possible interactions. Give your health care provider a list of all the medicines, herbs, non-prescription drugs, or dietary supplements you use. Also tell them if you smoke, drink alcohol, or use illegal drugs. Some items may interact with your medicine. What should I watch for while using this medicine? Tell your doctor if your symptoms do not get better or if they get worse. Visit your doctor or health care professional for regular checks on your progress. Because it may take several weeks to see the full effects of this medicine, it is important to continue your treatment as prescribed by your doctor. Patients and their families should watch out  for new   or worsening thoughts of suicide or depression. Also watch out for sudden changes in feelings such as feeling anxious, agitated, panicky, irritable, hostile, aggressive, impulsive, severely restless, overly excited and hyperactive, or not being able to sleep. If this happens, especially at the beginning of treatment or after a change in dose, call your health care professional. Avoid alcoholic drinks while taking this medicine. Drinking excessive alcoholic beverages, using sleeping or anxiety medicines, or quickly stopping the use of these agents while taking this medicine may increase your risk for a seizure. Do not drive or use heavy machinery until you know how this medicine affects you. This medicine can impair your ability to perform these tasks. Do not take this medicine close to bedtime. It may prevent you from sleeping. Your mouth may get dry. Chewing sugarless gum or sucking hard candy, and drinking plenty of water may help. Contact your doctor if the problem does not go away or is severe. What side effects may I notice from receiving this medicine? Side effects that you should report to your doctor or health care professional as soon as possible: -allergic reactions like skin rash, itching or hives, swelling of the face, lips, or tongue -breathing problems -changes in vision -confusion -fast or irregular heartbeat -hallucinations -increased blood pressure -redness, blistering, peeling or loosening of the skin, including inside the mouth -seizures -suicidal thoughts or other mood changes -unusually weak or tired -vomiting Side effects that usually do not require medical attention (report to your doctor or health care professional if they continue or are bothersome): -change in sex drive or performance -constipation -headache -loss of appetite -nausea -tremors -weight loss This list may not describe all possible side effects. Call your doctor for medical advice about side  effects. You may report side effects to FDA at 1-800-FDA-1088. Where should I keep my medicine? Keep out of the reach of children. Store at room temperature between 15 and 25 degrees C (59 and 77 degrees F), away from direct sunlight and moisture. Keep tightly closed. Throw away any unused medicine after the expiration date. NOTE: This sheet is a summary. It may not cover all possible information. If you have questions about this medicine, talk to your doctor, pharmacist, or health care provider.    2016, [employer]. (2012-07-30 12:42:42)  

## 2015-01-02 NOTE — Progress Notes (Signed)
SLEEP MEDICINE CLINIC   Provider:  Larey Seat, M D  Referring Provider: Redmond School, MD Primary Care Physician:  Glo Herring., MD  Chief Complaint  Patient presents with  . New Patient (Initial Visit)    Dr. Gerarda Fraction sent pt here for a sleep test, pt doesn't know if she snores, not sure why she is here, is a smoker, rm 10, alone    HPI:  Debra Graves is a 59 y.o. female , seen here as a referra  from Dr. Gerarda Fraction for a sleep consultation ,  Chief complaint according to patient :  " I was sleeping a bunch in daytime , I don't no more".   Debra Graves reports that her sleep habits have changed to from where she was usually hypersonic and sleeping too much in daytime as well as at night to a place where she has trouble to sleep at all. She is treated for depression and anxiety and she states that her mind is often busy making it hard for her to let go of the daily occurrences and she tends to be worried. Just recently she missed a piece of jewelry and she is afraid that a family member may have taken it also she cannot be sure of it.  This does bother her a lot and she does not want to cast suspicion on the family member. She feels tired a lot and fatigued. She also is a long-term smoker and is actively using tobacco for over 3 decades. She is treated for depression with an SSRI. She further endorsed that she has joint pain and aching muscles besides being fatigue. She is not sure if she snores or has apnea but Dr. Riley Kill stated that she has been taking narcotic pain medications which may suppress her breathing at night . I am suspecting that she has COPD, as she reports frequent bronchitis. She does not exercise. She is disabled since age 51, due to depression and anxiety .   Sleep habits are as follows: The patient usually goes to the bedroom at about 9:30 PM she likes to read the Bible in bed, she sleeps on multiple pillows at least 3. When she finally falls asleep it's usually  11 PM. She will only sleep for about 1-2 hours en bloc and then wakes up again. This is usually because she has the urge to urinate. She often wakes up every 2 hours or so to go to the bathroom overall nocturnal sleep is often less than 4.5 hours. She does not have a morning routine anymore. She wakes up when ever she feels like it, she does not use an alarm and that she has some appointments. See usually wakes up between 3 and 5 AM and cannot longer sleep.  She does suspect that her depression and anxiety may contribute to his sleep problems. She does not wake up short of breath, air hungry, gasping, and she does not snore herself awake. She lives by herself and there is nobody witnessing her sleep. During the daytime she is no longer finding the energy to exercise. Until about 3 months ago October she would sleep a lot in daytime -but she has given this up and tries not to sleep or nap.  She drinks about 1 caffeinated soda a day and about 2 cups of coffee. She does not drink any alcohol. She smokes about one half pack a day.  Sleep medical history and family sleep history:   She is unaware of any  CSA family history. A brother is supposed to use CPAP .  Social history: divorced, 1 son, alive and healthy, he bring her food . The patient is disabled.   Review of Systems: Out of a complete 14 system review, the patient complains of only the following symptoms, and all other reviewed systems are negative.   Epworth score 2 Fatigue severity score  60  , geriatric depression score 8,    Social History   Social History  . Marital Status: Divorced    Spouse Name: N/A  . Number of Children: N/A  . Years of Education: N/A   Occupational History  . disability    Social History Main Topics  . Smoking status: Current Every Day Smoker -- 0.50 packs/day for 41 years    Types: Cigarettes  . Smokeless tobacco: Never Used  . Alcohol Use: No  . Drug Use: No  . Sexual Activity: Not Currently     Birth Control/ Protection: None, Post-menopausal     Comment: once in 9 months   Other Topics Concern  . Not on file   Social History Narrative   Drinks 3 cups of coffee daily.    Family History  Problem Relation Age of Onset  . Cancer Mother 84    Breast then bones and all over  . Coronary artery disease Father   . Heart disease Father   . Stroke Father   . Cancer Sister 54    breast  . Hypertension Sister   . Stroke Brother   . Coronary artery disease Brother   . Coronary artery disease Brother   . Coronary artery disease Brother   . Heart disease Sister     mi  . Colon cancer Neg Hx     Past Medical History  Diagnosis Date  . Coronary artery disease   . Mental disorder     anxiety,depression  . Arthritis   . Hypercholesteremia   . Fibroids   . Anxiety   . Depression   . Rectocele 04/14/2012  . Constipation 04/14/2012  . CAD (coronary artery disease) 02/22/2013  . Aortic insufficiency 02/22/2013  . Back pain     bone spurs    Past Surgical History  Procedure Laterality Date  . Colonoscopy N/A 06/16/2013    Procedure: COLONOSCOPY;  Surgeon: Daneil Dolin, MD;  [facility];  Service: Endoscopy;  Laterality: N/A;  8:00  . Doppler echocardiography  03/20/2010    EF >55 %,trace mitral regurg,mild tricuspid regurg,aortic valve mildly sclerotic'mild to moderate aortic regurg,trace pulmonic valver regurg  . Nm myocar perf wall motion  03/19/06    EF 61%,low risk study  . Cardiac catheterization  03/08/2004    mild progressive CAD  . Carotid duplex  03/09/2007    rt subclavian 0 to 49%,right bulb 0 to 49 %,lf ICA normal    Current Outpatient Prescriptions  Medication Sig Dispense Refill  . aspirin 81 MG tablet Take 81 mg by mouth daily.    . ciprofloxacin (CIPRO) 500 MG tablet Take 500 mg by mouth 2 (two) times daily.    . clonazePAM (KLONOPIN) 0.5 MG tablet Take 0.5 mg by mouth daily.    [name] Calcium (STOOL SOFTENER PO) Take 2 tablets by mouth  daily.    . DULoxetine (CYMBALTA) 60 MG capsule Take 60 mg by mouth daily.    Marland Kitchen esomeprazole (NEXIUM) 40 MG capsule TAKE 1 CAPSULE BY MOUTH DAILY BEFORE BREAKFAST. 30 capsule 11  . fish oil-omega-3 fatty  acids 1000 MG capsule Take 2 g by mouth daily.    Marland Kitchen HYDROcodone-acetaminophen (NORCO/VICODIN) 5-325 MG per tablet Take 1 tablet by mouth every 4 (four) hours as needed for pain.    . hydrocortisone (CORTEF) 10 MG tablet Take 10 mg by mouth daily.    . Linaclotide (LINZESS) 145 MCG CAPS capsule TAKE 1 CAPSULE BY MOUTH 30 MINUTES PRIOR TO BREAKFAST. 30 capsule 5  . LORazepam (ATIVAN) 1 MG tablet Take 1 mg by mouth every 6 (six) hours as needed for anxiety.    . Multiple Vitamin (MULTIVITAMIN) capsule Take 1 capsule by mouth daily.    . rosuvastatin (CRESTOR) 20 MG tablet Take 1 tablet (20 mg total) by mouth at bedtime. 30 tablet 10  . zolpidem (AMBIEN) 10 MG tablet Take 10 mg by mouth at bedtime as needed for sleep.     No current facility-administered medications for this visit.    Allergies as of 01/02/2015 - Review Complete 01/02/2015  Allergen Reaction Noted  . Albuterol  04/14/2012  . Bactrim [sulfamethoxazole-trimethoprim]  04/14/2012  . Vytorin [ezetimibe-simvastatin]  03/25/2013    Vitals: BP 122/68 mmHg  Pulse 88  Resp 20  Ht 5\' 6"  (1.676 m)  Wt 173 lb (78.472 kg)  BMI 27.94 kg/m2 Last Weight:  Wt Readings from Last 1 Encounters:  01/02/15 173 lb (78.472 kg)   [id number] mass index is 27.94 kg/(m^2).     Last Height:   Ht Readings from Last 1 Encounters:  01/02/15 5\' 6"  (1.676 m)    Physical exam:  General: The patient is awake, alert and appears not in acute distress. The patient is well groomed. Head: Normocephalic, atraumatic. Neck is supple. Mallampati 3   neck circumference:15. Nasal airflow restricited - she has a deviated nasal septum, TMJ is evident. Retrognathia is seen.  Dentures, full sets on top, none on bottom.   Cardiovascular:  Regular rate and rhythm,  without murmurs or carotid bruit, and without distended neck veins. Respiratory: Lungs are clear to auscultation. Skin:  Without evidence of edema, or rash Trunk:  The patient's posture is erect.   Neurologic exam : The patient is awake and alert, oriented to place and time.   Memory subjective  described as intact.  Attention span & concentration ability appears normal.  Speech is fluent,- Without dysarthria, dysphonia or aphasia.  Mood and affect are appropriate.  Cranial nerves: Pupils are equal and briskly reactive to light. Funduscopic exam without  evidence of pallor or edema. Uses reading glasses.  Extraocular movements  in vertical and horizontal planes intact and without nystagmus. Visual fields by finger perimetry are intact. Hearing to finger rub intact.   Facial sensation intact to fine touch.  Facial motor strength is symmetric and tongue and uvula move midline. Shoulder shrug was symmetrical.   Motor exam:   Normal tone, muscle bulk and symmetric strength in all extremities. Grip strength is good.   Sensory:  Fine touch, pinprick and vibration were tested in all extremities. Proprioception tested in the upper extremities was normal.  Coordination: Rapid alternating movements in the fingers/hands was normal.  Finger-to-nose maneuver  normal without evidence of ataxia, dysmetria - very mild tremor with extended arms.  Gait and station: Patient walks without assistive device and is able unassisted to climb up to the exam table. Strength within normal limits.  Stance is stable and normal.   Deep tendon reflexes: in the  upper and lower extremities are symmetric and intact. Babinski maneuver response is  downgoing.  The patient was advised of the nature of the diagnosed sleep disorder , the treatment options and risks for general a health and wellness arising from not treating the condition.  I spent more than  40 minutes of face to face time with the patient. Greater than 50%  of time was spent in counseling and coordination of care. We have discussed the diagnosis and differential and I answered the patient's questions.     Assessment:  After physical and neurologic examination, review of laboratory studies,  Personal review of imaging studies, reports of other /same  Imaging studies ,  Results of polysomnography/ neurophysiology testing and pre-existing records as far as provided in visit., my assessment is   1)  I agree with Debra Graves that a part of her changing sleepiness and ability to sleep through the night may be related to depression and anxiety. Even that she is treated with medication she has experienced changes in mood and in her ability to let go. Some thoughts do follow her at night and worry her- keeping her from falling asleep. Her geriatric depression score is elevated at 7 points and her PHQ and 9 was elevated as well ; these have also been addressed by Dr. Gerarda Fraction.  2) Debra Graves has a deviated nasal septum making it hard for her to breathe through the nose so she is relegated to breathe through the mouth. She is not sure that she is snoring or if she has apnea but she certainly has a comorbidity as decades of smoking have caught up with her lung function. She is prone to have frequent bronchitis in spring and autumn.  3) her BMI is mildly elevated, my main concern would be smoking cessation, continue to treat depression. I would like for her to take  Hoe sleep test to screen her for apnea. This should be associated with hypoxemia if her underlying pulmonary condition affects her spur breathing.  Since she has been prescribed narcotic pain medications it is also worse to look for central sleep apnea an effect of suppressing the breathing reflex.    Plan:  Treatment plan and additional workup :  I suggested that people try a home sleep test I hope that will be permitted by her health insurance. If not I will invite her for a in lab study and would then  also order a couple  He in association with hypoxia screening. This would be in January 2017 as we have booked out for now.     Debra Partridge Alyxandria Wentz MD  01/02/2015   CC: [employer], [address] [place], [place] [address]

## 2015-01-15 ENCOUNTER — Ambulatory Visit (INDEPENDENT_AMBULATORY_CARE_PROVIDER_SITE_OTHER): Payer: Medicaid Other | Admitting: Neurology

## 2015-01-15 DIAGNOSIS — G4710 Hypersomnia, unspecified: Secondary | ICD-10-CM

## 2015-01-15 DIAGNOSIS — F5105 Insomnia due to other mental disorder: Secondary | ICD-10-CM

## 2015-01-15 DIAGNOSIS — R0683 Snoring: Secondary | ICD-10-CM

## 2015-01-16 NOTE — Sleep Study (Signed)
Please see the scanned sleep study interpretation located in the Procedure tab within the Chart Review section. 

## 2015-01-17 ENCOUNTER — Telehealth: Payer: Self-pay

## 2015-01-17 NOTE — Telephone Encounter (Signed)
Spoke to pt regarding sleep study results. I advised her that no organic sleep disorder was found and no cause for her insomnia either. I advised her that we could send her to an ENT if snoring is a concern. I advised her to avoid caffeine containing beverages and chocolate I advised her that if she is experiencing racing thoughts and awakening frequently during sleep, her PCP could consider a dedicated sleep psychology referral. A follow up with Dr. Brett Fairy is not needed. Pt verbalized understanding. Pt declined a f/u appt at this time.

## 2015-03-14 ENCOUNTER — Encounter: Payer: Self-pay | Admitting: Cardiovascular Disease

## 2015-03-14 ENCOUNTER — Ambulatory Visit (INDEPENDENT_AMBULATORY_CARE_PROVIDER_SITE_OTHER): Payer: Medicaid Other | Admitting: Cardiovascular Disease

## 2015-03-14 VITALS — BP 104/68 | HR 77 | Ht 66.0 in | Wt 173.0 lb

## 2015-03-14 DIAGNOSIS — E785 Hyperlipidemia, unspecified: Secondary | ICD-10-CM | POA: Diagnosis not present

## 2015-03-14 DIAGNOSIS — E2740 Unspecified adrenocortical insufficiency: Secondary | ICD-10-CM | POA: Diagnosis not present

## 2015-03-14 DIAGNOSIS — Z79899 Other long term (current) drug therapy: Secondary | ICD-10-CM

## 2015-03-14 DIAGNOSIS — I351 Nonrheumatic aortic (valve) insufficiency: Secondary | ICD-10-CM

## 2015-03-14 DIAGNOSIS — I2510 Atherosclerotic heart disease of native coronary artery without angina pectoris: Secondary | ICD-10-CM

## 2015-03-14 NOTE — Patient Instructions (Signed)
Dr. Croitoru recommends that you schedule a follow-up appointment in: ONE YEAR   

## 2015-03-16 DIAGNOSIS — E2740 Unspecified adrenocortical insufficiency: Secondary | ICD-10-CM | POA: Insufficient documentation

## 2015-03-16 NOTE — Progress Notes (Signed)
Patient ID: Debra Graves, female   DOB: 19-Nov-1955, 60 y.o.   MRN: [id number]    Cardiology Office Note    Date:  03/16/2015   ID:  Debra Graves, DOB 1955/09/04, MRN [id number]  PCP:  Glo Herring., MD  Cardiologist:   Sanda Klein, MD   Chief Complaint  Patient presents with  . Follow-up    Patient has no complaints.    History of Present Illness:  Debra Graves is a 60 y.o. female with moderate coronary artery disease and mild to moderate aortic insufficiency, hyperlipidemia and hypertension, here for yearly follow-up. Since her last appointment she has not had any problems with chest discomfort, exertional angina, dizziness, palpitations, syncope, focal neurological events or leg edema. She has had problems with fatigue and apparently her symptoms may be due to adrenal insufficiency. Primary care physician has started treatment with hydrocortisone. She is still waiting to see an endocrinologist. The first available appointment that she found with a provider covered by her insurance was in April in [place]. She is not smoking.   Past Medical History  Diagnosis Date  . Coronary artery disease   . Mental disorder     anxiety,depression  . Arthritis   . Hypercholesteremia   . Fibroids   . Anxiety   . Depression   . Rectocele 04/14/2012  . Constipation 04/14/2012  . CAD (coronary artery disease) 02/22/2013  . Aortic insufficiency 02/22/2013  . Back pain     bone spurs    Past Surgical History  Procedure Laterality Date  . Colonoscopy N/A 06/16/2013    Procedure: COLONOSCOPY;  Surgeon: Daneil Dolin, MD;  [facility];  Service: Endoscopy;  Laterality: N/A;  8:00  . Doppler echocardiography  03/20/2010    EF >55 %,trace mitral regurg,mild tricuspid regurg,aortic valve mildly sclerotic'mild to moderate aortic regurg,trace pulmonic valver regurg  . Nm myocar perf wall motion  03/19/06    EF 61%,low risk study  . Cardiac catheterization  03/08/2004    mild  progressive CAD  . Carotid duplex  03/09/2007    rt subclavian 0 to 49%,right bulb 0 to 49 %,lf ICA normal    Outpatient Prescriptions Prior to Visit  Medication Sig Dispense Refill  . aspirin 81 MG tablet Take 81 mg by mouth daily.    . clonazePAM (KLONOPIN) 0.5 MG tablet Take 0.5 mg by mouth daily.    [name] Calcium (STOOL SOFTENER PO) Take 2 tablets by mouth daily.    . DULoxetine (CYMBALTA) 60 MG capsule Take 60 mg by mouth daily.    Marland Kitchen esomeprazole (NEXIUM) 40 MG capsule TAKE 1 CAPSULE BY MOUTH DAILY BEFORE BREAKFAST. 30 capsule 11  . fish oil-omega-3 fatty acids 1000 MG capsule Take 2 g by mouth daily.    Marland Kitchen HYDROcodone-acetaminophen (NORCO/VICODIN) 5-325 MG per tablet Take 1 tablet by mouth every 4 (four) hours as needed for pain.    . hydrocortisone (CORTEF) 10 MG tablet Take 10 mg by mouth daily.    Marland Kitchen LORazepam (ATIVAN) 1 MG tablet Take 1 mg by mouth every 6 (six) hours as needed for anxiety.    . Multiple Vitamin (MULTIVITAMIN) capsule Take 1 capsule by mouth daily.    . rosuvastatin (CRESTOR) 20 MG tablet Take 1 tablet (20 mg total) by mouth at bedtime. 30 tablet 10  . zolpidem (AMBIEN) 10 MG tablet Take 10 mg by mouth at bedtime as needed for sleep.    Marland Kitchen buPROPion (WELLBUTRIN XL) 150 MG 24 hr  tablet Take 1 tablet (150 mg total) by mouth daily. 30 tablet 1  . ciprofloxacin (CIPRO) 500 MG tablet Take 500 mg by mouth 2 (two) times daily.    . Linaclotide (LINZESS) 145 MCG CAPS capsule TAKE 1 CAPSULE BY MOUTH 30 MINUTES PRIOR TO BREAKFAST. 30 capsule 5   No facility-administered medications prior to visit.     Allergies:   Albuterol; Bactrim; and Vytorin   Social History   Social History  . Marital Status: Divorced    Spouse Name: N/A  . Number of Children: N/A  . Years of Education: N/A   Occupational History  . disability    Social History Main Topics  . Smoking status: Current Every Day Smoker -- 0.50 packs/day for 41 years    Types: Cigarettes  . Smokeless  tobacco: Never Used  . Alcohol Use: No  . Drug Use: No  . Sexual Activity: Not Currently    Birth Control/ Protection: None, Post-menopausal     Comment: once in 9 months   Other Topics Concern  . None   Social History Narrative   Drinks 3 cups of coffee daily.     Family History:  The patient's family history includes Cancer (age of onset: 17) in her mother; Cancer (age of onset: 23) in her sister; Coronary artery disease in her brother, brother, brother, and father; Heart disease in her father and sister; Hypertension in her sister; Stroke in her brother and father. There is no history of Colon cancer.   ROS:   Please see the history of present illness.    ROS All other systems reviewed and are negative.   PHYSICAL EXAM:   VS:  BP 104/68 mmHg  Pulse 77  Ht 5\' 6"  (1.676 m)  Wt 78.472 kg (173 lb)  BMI 27.94 kg/m2   GEN: Well nourished, well developed, in no acute distress HEENT: normal Neck: no JVD, carotid bruits, or masses Cardiac: RRR; no murmurs, rubs, or gallops,no edema . I cannot hear a murmur of aortic insufficiency even when she is leaning forward and full expiration Respiratory:  clear to auscultation bilaterally, normal work of breathing GI: soft, nontender, nondistended, + BS MS: no deformity or atrophy Skin: warm and dry, no rash Neuro:  Alert and Oriented x 3, Strength and sensation are intact Psych: euthymic mood, full affect  Wt Readings from Last 3 Encounters:  03/14/15 78.472 kg (173 lb)  01/02/15 78.472 kg (173 lb)  03/08/14 81.194 kg (179 lb)      Studies/Labs Reviewed:   EKG:  EKG is ordered today.  The ekg ordered today demonstrates normal sinus rhythm, normal tracing  Recent Labs: 03/20/2014: ALT 13; BUN 14; Creat 0.93; Potassium 4.5; Sodium 140   Lipid Panel    Component Value Date/Time   CHOL 170 03/20/2014 0851   TRIG 116 03/20/2014 0851   HDL 66 03/20/2014 0851   CHOLHDL 2.6 03/20/2014 0851   VLDL 23 03/20/2014 0851   LDLCALC 81  03/20/2014 0851     ASSESSMENT:    1. Coronary artery disease involving native coronary artery of native heart without angina pectoris   2. Hyperlipidemia   3. Aortic insufficiency   4. Medication management   5. Adrenal insufficiency (HCC)      PLAN:  In order of problems listed above:  1. CAD; asymptomatic. The focus is on risk factor modification. Smoking cessation is important. Lipid levels are acceptable. 2. HLP: Tolerating statin without side effects. Good lipid profile 3. AI: Not  audible on physical exam, mild to moderate by echo, no signs of heart failure or left ventricular enlargement by exam or echo 4. Continue yearly labs/liver function tests 5. Suggested that she may be able to secure an earlier appointment with Dr. Dorris Fetch in [place].   Medication Adjustments/Labs and Tests Ordered: Current medicines are reviewed at length with the patient today.  Concerns regarding medicines are outlined above.  Medication changes, Labs and Tests ordered today are listed in the Patient Instructions below. Patient Instructions  Dr. Sallyanne Kuster recommends that you schedule a follow-up appointment in: ONE YEAR        SignedSanda Klein, MD  03/16/2015 3:01 PM    [facility] [address], [place], [place]  [address] Phone: [phone]; Fax: [phone]

## 2015-03-21 ENCOUNTER — Other Ambulatory Visit: Payer: Self-pay | Admitting: Cardiovascular Disease

## 2015-03-21 NOTE — Telephone Encounter (Signed)
REFILL 

## 2015-03-29 ENCOUNTER — Encounter: Payer: Self-pay | Admitting: "Endocrinology

## 2015-03-29 ENCOUNTER — Ambulatory Visit (INDEPENDENT_AMBULATORY_CARE_PROVIDER_SITE_OTHER): Payer: Medicaid Other | Admitting: "Endocrinology

## 2015-03-29 VITALS — BP 121/76 | HR 87 | Ht 66.0 in | Wt 172.0 lb

## 2015-03-29 DIAGNOSIS — E2740 Unspecified adrenocortical insufficiency: Secondary | ICD-10-CM

## 2015-03-29 NOTE — Progress Notes (Signed)
Subjective:    Patient ID: Debra Graves, female    DOB: July 25, 1955, PCP Glo Herring., MD   Past Medical History  Diagnosis Date  . Coronary artery disease   . Mental disorder     anxiety,depression  . Arthritis   . Hypercholesteremia   . Fibroids   . Anxiety   . Depression   . Rectocele 04/14/2012  . Constipation 04/14/2012  . CAD (coronary artery disease) 02/22/2013  . Aortic insufficiency 02/22/2013  . Back pain     bone spurs   Past Surgical History  Procedure Laterality Date  . Colonoscopy N/A 06/16/2013    Procedure: COLONOSCOPY;  Surgeon: Daneil Dolin, MD;  Location: [facility];  Service: Endoscopy;  Laterality: N/A;  8:00  . Doppler echocardiography  03/20/2010    EF >55 %,trace mitral regurg,mild tricuspid regurg,aortic valve mildly sclerotic'mild to moderate aortic regurg,trace pulmonic valver regurg  . Nm myocar perf wall motion  03/19/06    EF 61%,low risk study  . Cardiac catheterization  03/08/2004    mild progressive CAD  . Carotid duplex  03/09/2007    rt subclavian 0 to 49%,right bulb 0 to 49 %,lf ICA normal   Social History   Social History  . Marital Status: Divorced    Spouse Name: N/A  . Number of Children: N/A  . Years of Education: N/A   Occupational History  . disability    Social History Main Topics  . Smoking status: Current Every Day Smoker -- 0.50 packs/day for 41 years    Types: Cigarettes  . Smokeless tobacco: Never Used  . Alcohol Use: No  . Drug Use: No  . Sexual Activity: Not Currently    Birth Control/ Protection: None, Post-menopausal     Comment: once in 9 months   Other Topics Concern  . None   Social History Narrative   Drinks 3 cups of coffee daily.   Outpatient Encounter Prescriptions as of 03/29/2015  Medication Sig  . aspirin 81 MG tablet Take 81 mg by mouth daily.  . clonazePAM (KLONOPIN) 0.5 MG tablet Take 0.5 mg by mouth daily.  [name] Calcium (STOOL SOFTENER PO) Take 2 tablets by mouth daily.  .  DULoxetine (CYMBALTA) 60 MG capsule Take 60 mg by mouth daily.  . fish oil-omega-3 fatty acids 1000 MG capsule Take 2 g by mouth daily.  Marland Kitchen HYDROcodone-acetaminophen (NORCO/VICODIN) 5-325 MG per tablet Take 1 tablet by mouth every 4 (four) hours as needed for pain.  . hydrocortisone (CORTEF) 10 MG tablet Take 10 mg by mouth daily. Reported on 03/29/2015  . LORazepam (ATIVAN) 1 MG tablet Take 1 mg by mouth every 6 (six) hours as needed for anxiety.  . rosuvastatin (CRESTOR) 20 MG tablet TAKE (1) TABLET BY MOUTH AT BEDTIME.  Marland Kitchen esomeprazole (NEXIUM) 40 MG capsule TAKE 1 CAPSULE BY MOUTH DAILY BEFORE BREAKFAST. (Patient not taking: Reported on 03/29/2015)  . Multiple Vitamin (MULTIVITAMIN) capsule Take 1 capsule by mouth daily. Reported on 03/29/2015  . zolpidem (AMBIEN) 10 MG tablet Take 10 mg by mouth at bedtime as needed for sleep. Reported on 03/29/2015   No facility-administered encounter medications on file as of 03/29/2015.   ALLERGIES: Allergies  Allergen Reactions  . Albuterol   . Bactrim [Sulfamethoxazole-Trimethoprim]   . Vytorin [Ezetimibe-Simvastatin]     Myalgias    VACCINATION STATUS:  There is no immunization history on file for this patient.  HPI   Review of Systems Constitutional: no weight gain/loss,  +  fatigue, no subjective hyperthermia/hypothermia Eyes: no blurry vision, no xerophthalmia ENT: no sore throat, no nodules palpated in throat, no dysphagia/odynophagia, no hoarseness Cardiovascular: no CP/SOB/palpitations/leg swelling Respiratory: + cough, +SOB Gastrointestinal: no N/V/D/C Musculoskeletal: no muscle/joint aches Skin: no rashes Neurological: no tremors/numbness/tingling/dizziness Psychiatric: no depression/anxiety  Objective:    BP 121/76 mmHg  Pulse 87  Ht 5\' 6"  (1.676 m)  Wt 172 lb (78.019 kg)  BMI 27.77 kg/m2  SpO2 98%  Wt Readings from Last 3 Encounters:  03/29/15 172 lb (78.019 kg)  03/14/15 173 lb (78.472 kg)  01/02/15 173 lb (78.472 kg)     Physical Exam Constitutional: overweight, in NAD Eyes: PERRLA, EOMI, no exophthalmos ENT: moist mucous membranes, no thyromegaly, no cervical lymphadenopathy Cardiovascular: RRR, No MRG Respiratory: + wheezes scattered on bilateral lung fields. Gastrointestinal: abdomen soft, NT, ND, BS+ Musculoskeletal: no deformities, strength intact in all 4 Skin: moist, warm, no rashes Neurological: no tremor with outstretched hands, DTR normal in all 4   CMP     Component Value Date/Time   NA 140 03/20/2014 0851   K 4.5 03/20/2014 0851   CL 102 03/20/2014 0851   CO2 28 03/20/2014 0851   GLUCOSE 96 03/20/2014 0851   BUN 14 03/20/2014 0851   CREATININE 0.93 03/20/2014 0851   CALCIUM 9.7 03/20/2014 0851   PROT 6.3 03/20/2014 0851   ALBUMIN 4.0 03/20/2014 0851   AST 16 03/20/2014 0851   ALT 13 03/20/2014 0851   ALKPHOS 47 03/20/2014 0851   BILITOT 0.5 03/20/2014 0851     Diabetic Labs (most recent): No results found for: HGBA1C   Lipid Panel ( most recent) Lipid Panel     Component Value Date/Time   CHOL 170 03/20/2014 0851   TRIG 116 03/20/2014 0851   HDL 66 03/20/2014 0851   CHOLHDL 2.6 03/20/2014 0851   VLDL 23 03/20/2014 0851   LDLCALC 81 03/20/2014 0851    On 11/29/2014 her a.m. cortisol was 6.7 (normal 6.2-19.4) P.m. cortisol was low at 0.7 (normal 2.3-11.9)     Assessment & Plan:   1. Adrenal insufficiency ([place]) -Her available records are reviewed and patient is clinically evaluated. Per her neck November lab work she did have p.m. Hypercortisolemia.  -We have not confirmed the cause of adrenal insufficiency. Patient seems to have responded to low-dose hydrocortisone. I have advised her to discontinue hydrocortisone for the next 10 days, perform ACTH stim elation test and return for a reevaluation in 2 weeks. -If adrenal insufficiency is confirmed she would be considered for long-term steroid replacement. -Counseled patient extensively against smoking for her  to  lower the chance of COPD which may complicate adrenal function even further.   - I advised patient to maintain close follow up with Glo Herring., MD for primary care needs. Follow up plan: Return in about 2 weeks (around 04/12/2015) for follow up with pre-visit labs, ACTH stim test in 10 days, she will stop hydrocortisone today.Glade Lloyd, MD Phone: [phone]  Fax: [phone]   03/29/2015, 1:56 PM

## 2015-04-05 ENCOUNTER — Encounter (HOSPITAL_COMMUNITY)
Admission: RE | Admit: 2015-04-05 | Discharge: 2015-04-05 | Disposition: A | Discharge status: Home / Self Care | Payer: Medicaid Other | Source: Ambulatory Visit | Attending: "Endocrinology | Admitting: "Endocrinology

## 2015-04-05 DIAGNOSIS — E2740 Unspecified adrenocortical insufficiency: Secondary | ICD-10-CM | POA: Diagnosis present

## 2015-04-05 LAB — ACTH STIMULATION, 3 TIME POINTS
CORTISOL 30 MIN: 17.1 ug/dL
Cortisol, 60 Min: 19.6 ug/dL
Cortisol, Base: 7.4 ug/dL

## 2015-04-05 MED ORDER — COSYNTROPIN 0.25 MG IJ SOLR
0.2500 mg | Freq: Once | INTRAMUSCULAR | Status: AC
Start: 2015-04-05 — End: 2015-04-05
  Administered 2015-04-05: 0.25 mg via INTRAVENOUS
  Filled 2015-04-05: qty 0.25

## 2015-04-05 NOTE — Progress Notes (Signed)
Results for HOORIA, TRANTER (MRN [id number]) as of 04/05/2015 13:59  Ref. Range 04/05/2015 08:10  Cortisol, Base Latest Units: ug/dL 7.4  Cortisol, 30 Min Latest Units: ug/dL 17.1  Cortisol, 60 Min Latest Units: ug/dL 19.6   Cosyntropin 0.25 mg IV administered post AM Cortisol level

## 2015-04-09 ENCOUNTER — Other Ambulatory Visit: Payer: Self-pay | Admitting: Cardiovascular Disease

## 2015-04-10 LAB — COMPREHENSIVE METABOLIC PANEL
A/G RATIO: 1.9 (ref 1.2–2.2)
ALT: 12 IU/L (ref 0–32)
AST: 21 IU/L (ref 0–40)
Albumin: 4.1 g/dL (ref 3.6–4.8)
Alkaline Phosphatase: 40 IU/L (ref 39–117)
BUN/Creatinine Ratio: 15 (ref 11–26)
BUN: 12 mg/dL (ref 8–27)
Bilirubin Total: 0.4 mg/dL (ref 0.0–1.2)
CALCIUM: 9.6 mg/dL (ref 8.7–10.3)
CO2: 28 mmol/L (ref 18–29)
Chloride: 103 mmol/L (ref 96–106)
Creatinine, Ser: 0.8 mg/dL (ref 0.57–1.00)
GFR calc Af Amer: 93 mL/min/{1.73_m2} (ref >59)
GFR, EST NON AFRICAN AMERICAN: 80 mL/min/{1.73_m2} (ref >59)
GLOBULIN, TOTAL: 2.2 g/dL (ref 1.5–4.5)
Glucose: 98 mg/dL (ref 65–99)
Potassium: 4.5 mmol/L (ref 3.5–5.2)
SODIUM: 143 mmol/L (ref 134–144)
Total Protein: 6.3 g/dL (ref 6.0–8.5)

## 2015-04-10 LAB — LIPID PANEL W/O CHOL/HDL RATIO
Cholesterol, Total: 166 mg/dL (ref 100–199)
HDL: 76 mg/dL (ref >39)
LDL Calculated: 74 mg/dL (ref 0–99)
TRIGLYCERIDES: 79 mg/dL (ref 0–149)
VLDL Cholesterol Cal: 16 mg/dL (ref 5–40)

## 2015-04-13 ENCOUNTER — Ambulatory Visit (INDEPENDENT_AMBULATORY_CARE_PROVIDER_SITE_OTHER): Payer: Medicaid Other | Admitting: "Endocrinology

## 2015-04-13 ENCOUNTER — Encounter: Payer: Self-pay | Admitting: "Endocrinology

## 2015-04-13 VITALS — BP 115/74 | HR 92 | Ht 66.0 in | Wt 173.0 lb

## 2015-04-13 DIAGNOSIS — E2740 Unspecified adrenocortical insufficiency: Secondary | ICD-10-CM

## 2015-04-13 NOTE — Progress Notes (Signed)
Subjective:    Patient ID: Debra Graves, female    DOB: [birth year], PCP Debra Graves   Past Medical History  Diagnosis Date  . Coronary artery disease   . Mental disorder     anxiety,depression  . Arthritis   . Hypercholesteremia   . Fibroids   . Anxiety   . Depression   . Rectocele 04/14/2012  . Constipation 04/14/2012  . CAD (coronary artery disease) 02/22/2013  . Aortic insufficiency 02/22/2013  . Back pain     bone spurs   Past Surgical History  Procedure Laterality Date  . Colonoscopy N/A 06/16/2013    Procedure: COLONOSCOPY;  Surgeon: Debra Dolin, MD;  Location: [facility];  Service: Endoscopy;  Laterality: N/A;  8:00  . Doppler echocardiography  03/20/2010    EF >55 %,trace mitral regurg,mild tricuspid regurg,aortic valve mildly sclerotic'mild to moderate aortic regurg,trace pulmonic valver regurg  . Nm myocar perf wall motion  03/19/06    EF 61%,low risk study  . Cardiac catheterization  03/08/2004    mild progressive CAD  . Carotid duplex  03/09/2007    rt subclavian 0 to 49%,right bulb 0 to 49 %,lf ICA normal   Social History   Social History  . Marital Status: Divorced    Spouse Name: N/A  . Number of Children: N/A  . Years of Education: N/A   Occupational History  . disability    Social History Main Topics  . Smoking status: Current Every Day Smoker -- 0.50 packs/day for 41 years    Types: Cigarettes  . Smokeless tobacco: Never Used  . Alcohol Use: No  . Drug Use: No  . Sexual Activity: Not Currently    Birth Control/ Protection: None, Post-menopausal     Comment: once in 9 months   Other Topics Concern  . None   Social History Narrative   Drinks 3 cups of coffee daily.   Outpatient Encounter Prescriptions as of 04/13/2015  Medication Sig  . aspirin 81 MG tablet Take 81 mg by mouth daily.  . clonazePAM (KLONOPIN) 0.5 MG tablet Take 0.5 mg by mouth daily.  Debra Graves Calcium (STOOL SOFTENER PO) Take 2 tablets by mouth daily.  .  DULoxetine (CYMBALTA) 60 MG capsule Take 60 mg by mouth daily.  Marland Kitchen esomeprazole (NEXIUM) 40 MG capsule TAKE 1 CAPSULE BY MOUTH DAILY BEFORE BREAKFAST. (Patient not taking: Reported on 03/29/2015)  . fish oil-omega-3 fatty acids 1000 MG capsule Take 2 g by mouth daily.  Marland Kitchen HYDROcodone-acetaminophen (NORCO/VICODIN) 5-325 MG per tablet Take 1 tablet by mouth every 4 (four) hours as needed for pain.  Marland Kitchen LORazepam (ATIVAN) 1 MG tablet Take 1 mg by mouth every 6 (six) hours as needed for anxiety.  . Multiple Vitamin (MULTIVITAMIN) capsule Take 1 capsule by mouth daily. Reported on 03/29/2015  . rosuvastatin (CRESTOR) 20 MG tablet TAKE (1) TABLET BY MOUTH AT BEDTIME.  Marland Kitchen zolpidem (AMBIEN) 10 MG tablet Take 10 mg by mouth at bedtime as needed for sleep. Reported on 03/29/2015  . [DISCONTINUED] hydrocortisone (CORTEF) 10 MG tablet Take 10 mg by mouth daily. Reported on 03/29/2015   No facility-administered encounter medications on file as of 04/13/2015.   ALLERGIES: Allergies  Allergen Reactions  . Albuterol   . Bactrim [Sulfamethoxazole-Trimethoprim]   . Vytorin [Ezetimibe-Simvastatin]     Myalgias    VACCINATION STATUS:  There is no immunization history on file for this patient.  HPI  Debra Graves is a 60 year old female patient was  seen 2 weeks ago for consultation due to hypo-cortisolemia. -She was sent for ACTH stimulation test after holding her hydrocortisone for 10 days. She returns for follow-up. She has no new complaints.  Review of Systems Constitutional: no weight gain/loss,  + fatigue, no subjective hyperthermia/hypothermia Eyes: no blurry vision, no xerophthalmia ENT: no sore throat, no nodules palpated in throat, no dysphagia/odynophagia, no hoarseness Cardiovascular: no CP/SOB/palpitations/leg swelling Respiratory: + cough, +SOB Gastrointestinal: no N/V/D/C Musculoskeletal: no muscle/joint aches Skin: no rashes Neurological: no tremors/numbness/tingling/dizziness Psychiatric: no  depression/anxiety  Objective:    BP 115/74 mmHg  Pulse 92  Ht 5\' 6"  (1.676 m)  Wt 173 lb (78.472 kg)  BMI 27.94 kg/m2  SpO2 96%  Wt Readings from Last 3 Encounters:  04/13/15 173 lb (78.472 kg)  03/29/15 172 lb (78.019 kg)  03/14/15 173 lb (78.472 kg)    Physical Exam Constitutional: overweight, in NAD Eyes: PERRLA, EOMI, no exophthalmos ENT: moist mucous membranes, no thyromegaly, no cervical lymphadenopathy Cardiovascular: RRR, No MRG Respiratory: + wheezes scattered on bilateral lung fields. Gastrointestinal: abdomen soft, NT, ND, BS+ Musculoskeletal: no deformities, strength intact in all 4 Skin: moist, warm, no rashes Neurological: no tremor with outstretched hands, DTR normal in all 4   CMP     Component Value Date/Time   NA 143 04/09/2015 0957   NA 140 03/20/2014 0851   K 4.5 04/09/2015 0957   CL 103 04/09/2015 0957   CO2 28 04/09/2015 0957   GLUCOSE 98 04/09/2015 0957   GLUCOSE 96 03/20/2014 0851   BUN 12 04/09/2015 0957   BUN 14 03/20/2014 0851   CREATININE 0.80 04/09/2015 0957   CREATININE 0.93 03/20/2014 0851   CALCIUM 9.6 04/09/2015 0957   PROT 6.3 04/09/2015 0957   PROT 6.3 03/20/2014 0851   ALBUMIN 4.1 04/09/2015 0957   ALBUMIN 4.0 03/20/2014 0851   AST 21 04/09/2015 0957   ALT 12 04/09/2015 0957   ALKPHOS 40 04/09/2015 0957   BILITOT 0.4 04/09/2015 0957   BILITOT 0.5 03/20/2014 0851   GFRNONAA 80 04/09/2015 0957   GFRAA 93 04/09/2015 0957   Lipid Panel     Component Value Date/Time   CHOL 166 04/09/2015 0957   CHOL 170 03/20/2014 0851   TRIG 79 04/09/2015 0957   HDL 76 04/09/2015 0957   HDL 66 03/20/2014 0851   CHOLHDL 2.6 03/20/2014 0851   VLDL 23 03/20/2014 0851   LDLCALC 74 04/09/2015 0957   LDLCALC 81 03/20/2014 0851    On 11/29/2014 her a.m. cortisol was 6.7 (normal 6.2-19.4) P.m. cortisol was low at 0.7 (normal 2.3-11.9)  Results Of ACTH stimulation test for Debra Graves (MRN [id number]) as of 04/13/2015 17:06  Ref. Range  04/05/2015 08:10 04/09/2015 09:57  Cortisol, Base Latest Units: ug/dL 7.4   Cortisol, 30 Min Latest Units: ug/dL 17.1   Cortisol, 60 Min Latest Units: ug/dL 19.6      Assessment & Plan:   1. Partial Adrenal insufficiency ([place]) -Her available records are reviewed and patient is clinically evaluated.  - Her ACTH stimulation test is consistent with partial adrenal sufficiency, however would not need steroid replacement at this point. -I advised her to stay off of hydrocortisone. She will have a.m. cortisol and CMP in 6 month. -Counseled patient extensively against smoking for her  to lower the chance of COPD which may complicate adrenal function even further.   - I advised patient to maintain close follow up with Delman Cheadle, PA-C for primary care needs. Follow up plan: Return in  about 6 months (around 10/14/2015) for follow up with pre-visit labs.  Glade Lloyd, MD Phone: [phone]  Fax: [phone]   04/13/2015, 5:06 PM

## 2015-05-18 ENCOUNTER — Telehealth: Payer: Self-pay

## 2015-05-18 NOTE — Telephone Encounter (Signed)
Prior auth obtained through [employer] for Rosuvastatin 20 mg. Auth # [id number]. Local pharmacy notified.

## 2015-06-05 ENCOUNTER — Other Ambulatory Visit: Payer: Self-pay | Admitting: Gastroenterology

## 2015-06-06 ENCOUNTER — Encounter: Payer: Self-pay | Admitting: Internal Medicine

## 2015-06-06 NOTE — Telephone Encounter (Signed)
Stacey, please schedule ov.  

## 2015-06-06 NOTE — Telephone Encounter (Signed)
Please notify the patient she hasn't been seen in 2+ years. I will provide limited supply, but will need follow-up OV for continued refills.

## 2015-06-27 ENCOUNTER — Ambulatory Visit (INDEPENDENT_AMBULATORY_CARE_PROVIDER_SITE_OTHER): Payer: Medicaid Other | Admitting: Gastroenterology

## 2015-06-27 ENCOUNTER — Encounter: Payer: Self-pay | Admitting: Gastroenterology

## 2015-06-27 VITALS — BP 136/77 | HR 96 | Temp 97.8°F | Ht 66.0 in | Wt 169.0 lb

## 2015-06-27 DIAGNOSIS — K5900 Constipation, unspecified: Secondary | ICD-10-CM | POA: Diagnosis not present

## 2015-06-27 DIAGNOSIS — K219 Gastro-esophageal reflux disease without esophagitis: Secondary | ICD-10-CM | POA: Diagnosis not present

## 2015-06-27 MED ORDER — ESOMEPRAZOLE MAGNESIUM 40 MG PO CPDR: DELAYED_RELEASE_CAPSULE | ORAL | Status: DC

## 2015-06-27 NOTE — Progress Notes (Signed)
Referring Provider: Jake Samples, PA* Primary Care Physician:  Jana Half  Primary GI: Dr. Gala Romney   Chief Complaint  Patient presents with  . Medication Refill    HPI:   Debra Graves is a 60 y.o. female presenting today with a history of constipation and GERD. Next colonoscopy due in 2025.   Linzess 145 didn't seem to help too much. Trying to avoid eating late at night, sleeps with head propped up. Nexium prescription strength. No dysphagia. No abdominal pain. 2 stool softeners daily.    Past Medical History  Diagnosis Date  . Coronary artery disease   . Mental disorder     anxiety,depression  . Arthritis   . Hypercholesteremia   . Fibroids   . Anxiety   . Depression   . Rectocele 04/14/2012  . Constipation 04/14/2012  . CAD (coronary artery disease) 02/22/2013  . Aortic insufficiency 02/22/2013  . Back pain     bone spurs    Past Surgical History  Procedure Laterality Date  . Colonoscopy N/A 06/16/2013    Dr. Gala Romney: hyperplastic polyps, melanosis coli   . Doppler echocardiography  03/20/2010    EF >55 %,trace mitral regurg,mild tricuspid regurg,aortic valve mildly sclerotic'mild to moderate aortic regurg,trace pulmonic valver regurg  . Nm myocar perf wall motion  03/19/06    EF 61%,low risk study  . Cardiac catheterization  03/08/2004    mild progressive CAD  . Carotid duplex  03/09/2007    rt subclavian 0 to 49%,right bulb 0 to 49 %,lf ICA normal    Current Outpatient Prescriptions  Medication Sig Dispense Refill  . aspirin 81 MG tablet Take 81 mg by mouth daily.    . clonazePAM (KLONOPIN) 0.5 MG tablet Take 0.5 mg by mouth daily.    [name] Calcium (STOOL SOFTENER PO) Take 2 tablets by mouth daily.    . DULoxetine (CYMBALTA) 60 MG capsule Take 60 mg by mouth daily.    . fish oil-omega-3 fatty acids 1000 MG capsule Take 2 g by mouth daily.    Marland Kitchen HYDROcodone-acetaminophen (NORCO/VICODIN) 5-325 MG per tablet Take 1 tablet by mouth every 4 (four)  hours as needed for pain.    Marland Kitchen LORazepam (ATIVAN) 1 MG tablet Take 1 mg by mouth every 6 (six) hours as needed for anxiety.    . Multiple Vitamin (MULTIVITAMIN) capsule Take 1 capsule by mouth daily. Reported on 03/29/2015    . NEXIUM 40 MG capsule TAKE 1 CAPSULE BY MOUTH DAILY BEFORE BREAKFAST. 30 capsule 1  . rosuvastatin (CRESTOR) 20 MG tablet TAKE (1) TABLET BY MOUTH AT BEDTIME. 30 tablet 10  . zolpidem (AMBIEN) 10 MG tablet Take 10 mg by mouth at bedtime as needed for sleep. Reported on 06/27/2015     No current facility-administered medications for this visit.    Allergies as of 06/27/2015 - Review Complete 06/27/2015  Allergen Reaction Noted  . Albuterol  04/14/2012  . Bactrim [sulfamethoxazole-trimethoprim]  04/14/2012  . Vytorin [ezetimibe-simvastatin]  03/25/2013    Family History  Problem Relation Age of Onset  . Cancer Mother 19    Breast then bones and all over  . Coronary artery disease Father   . Heart disease Father   . Stroke Father   . Cancer Sister 87    breast  . Hypertension Sister   . Stroke Brother   . Coronary artery disease Brother   . Coronary artery disease Brother   . Coronary artery disease Brother   .  Heart disease Sister     mi  . Colon cancer Neg Hx     Social History   Social History  . Marital Status: Divorced    Spouse Name: N/A  . Number of Children: N/A  . Years of Education: N/A   Occupational History  . disability    Social History Main Topics  . Smoking status: Current Every Day Smoker -- 0.50 packs/day for 41 years    Types: Cigarettes  . Smokeless tobacco: Never Used  . Alcohol Use: No  . Drug Use: No  . Sexual Activity: Not Currently    Birth Control/ Protection: None, Post-menopausal     Comment: once in 9 months   Other Topics Concern  . None   Social History Narrative   Drinks 3 cups of coffee daily.    Review of Systems: Negative unless mentioned in HPI.   Physical Exam: BP 136/77 mmHg  Pulse 96   Temp(Src) 97.8 F (36.6 C)  Ht 5\' 6"  (1.676 m)  Wt 169 lb (76.658 kg)  BMI 27.29 kg/m2 General:   Alert and oriented. No distress noted. Pleasant and cooperative.  Head:  Normocephalic and atraumatic. Eyes:  Conjuctiva clear without scleral icterus. Abdomen:  +BS, soft, non-tender and non-distended. No rebound or guarding. No HSM or masses noted. Msk:  Symmetrical without gross deformities. Normal posture. Extremities:  Without edema. Neurologic:  Alert and  oriented x4;  grossly normal neurologically. Psych:  Alert and cooperative. Normal mood and affect.

## 2015-06-27 NOTE — Patient Instructions (Signed)
I have given samples of Linzess to take once each morning, 30 minutes before breakfast. This is a stronger dose than what you had before. Let me know if you would like samples!   I have refilled your Nexium.  I will see you in 1 year!  Tell everyone hello for me :)

## 2015-06-27 NOTE — Progress Notes (Signed)
cc'ed to pcp °

## 2015-06-27 NOTE — Assessment & Plan Note (Signed)
Linzess 145 not ideally effective. Increase to 290 mcg dosage. Samples provided. Patient to call if she would like a prescription. Return in 1 year.

## 2015-06-27 NOTE — Assessment & Plan Note (Signed)
Doing well with Nexium. Continue once daily. Refills provided. 1 year return.

## 2015-09-05 ENCOUNTER — Other Ambulatory Visit (HOSPITAL_COMMUNITY): Payer: Self-pay | Admitting: Internal Medicine

## 2015-09-05 DIAGNOSIS — Z1231 Encounter for screening mammogram for malignant neoplasm of breast: Secondary | ICD-10-CM

## 2015-09-10 ENCOUNTER — Other Ambulatory Visit (HOSPITAL_COMMUNITY): Payer: Self-pay | Admitting: Internal Medicine

## 2015-09-10 DIAGNOSIS — Z1231 Encounter for screening mammogram for malignant neoplasm of breast: Secondary | ICD-10-CM

## 2015-09-17 ENCOUNTER — Ambulatory Visit: Payer: Medicaid Other

## 2015-09-17 ENCOUNTER — Ambulatory Visit (HOSPITAL_COMMUNITY): Payer: Medicaid Other

## 2015-09-17 ENCOUNTER — Ambulatory Visit (HOSPITAL_COMMUNITY)
Admission: RE | Admit: 2015-09-17 | Discharge: 2015-09-17 | Disposition: A | Discharge status: Home / Self Care | Payer: Medicaid Other | Source: Ambulatory Visit | Attending: Internal Medicine | Admitting: Internal Medicine

## 2015-09-17 DIAGNOSIS — Z1231 Encounter for screening mammogram for malignant neoplasm of breast: Secondary | ICD-10-CM | POA: Insufficient documentation

## 2015-10-18 ENCOUNTER — Ambulatory Visit: Payer: Medicaid Other | Admitting: "Endocrinology

## 2016-01-15 ENCOUNTER — Emergency Department (HOSPITAL_COMMUNITY)
Admission: EM | Admit: 2016-01-15 | Discharge: 2016-01-15 | Disposition: A | Discharge status: Home / Self Care | Payer: Medicaid Other | Attending: Emergency Medicine | Admitting: Emergency Medicine

## 2016-01-15 ENCOUNTER — Encounter (HOSPITAL_COMMUNITY): Payer: Self-pay | Admitting: Emergency Medicine

## 2016-01-15 DIAGNOSIS — R1031 Right lower quadrant pain: Secondary | ICD-10-CM | POA: Insufficient documentation

## 2016-01-15 DIAGNOSIS — Z7982 Long term (current) use of aspirin: Secondary | ICD-10-CM | POA: Diagnosis not present

## 2016-01-15 DIAGNOSIS — F17210 Nicotine dependence, cigarettes, uncomplicated: Secondary | ICD-10-CM | POA: Insufficient documentation

## 2016-01-15 DIAGNOSIS — R109 Unspecified abdominal pain: Secondary | ICD-10-CM

## 2016-01-15 DIAGNOSIS — Z79899 Other long term (current) drug therapy: Secondary | ICD-10-CM | POA: Diagnosis not present

## 2016-01-15 DIAGNOSIS — Z5321 Procedure and treatment not carried out due to patient leaving prior to being seen by health care provider: Secondary | ICD-10-CM | POA: Diagnosis not present

## 2016-01-15 DIAGNOSIS — I2510 Atherosclerotic heart disease of native coronary artery without angina pectoris: Secondary | ICD-10-CM | POA: Insufficient documentation

## 2016-01-15 LAB — COMPREHENSIVE METABOLIC PANEL
ALK PHOS: 45 U/L (ref 38–126)
ALT: 15 U/L (ref 14–54)
ANION GAP: 7 (ref 5–15)
AST: 19 U/L (ref 15–41)
Albumin: 4.2 g/dL (ref 3.5–5.0)
BILIRUBIN TOTAL: 0.5 mg/dL (ref 0.3–1.2)
BUN: 17 mg/dL (ref 6–20)
CALCIUM: 9.2 mg/dL (ref 8.9–10.3)
CO2: 27 mmol/L (ref 22–32)
Chloride: 101 mmol/L (ref 101–111)
Creatinine, Ser: 0.86 mg/dL (ref 0.44–1.00)
Glucose, Bld: 114 mg/dL — ABNORMAL HIGH (ref 65–99)
POTASSIUM: 4.3 mmol/L (ref 3.5–5.1)
Sodium: 135 mmol/L (ref 135–145)
TOTAL PROTEIN: 7 g/dL (ref 6.5–8.1)

## 2016-01-15 LAB — CBC
HEMATOCRIT: 41.4 % (ref 36.0–46.0)
Hemoglobin: 14 g/dL (ref 12.0–15.0)
MCH: 31.7 pg (ref 26.0–34.0)
MCHC: 33.8 g/dL (ref 30.0–36.0)
MCV: 93.7 fL (ref 78.0–100.0)
Platelets: 259 10*3/uL (ref 150–400)
RBC: 4.42 MIL/uL (ref 3.87–5.11)
RDW: 11.9 % (ref 11.5–15.5)
WBC: 7.5 10*3/uL (ref 4.0–10.5)

## 2016-01-15 LAB — LIPASE, BLOOD: Lipase: 22 U/L (ref 11–51)

## 2016-01-15 NOTE — ED Triage Notes (Signed)
Pt reports RLQ abdominal pain that started on Sunday. Pt states the pain was severe on Sunday. Pt says pain is decreased today but she is still sore. Pt unable to get in at her PCP's office.

## 2016-01-15 NOTE — ED Notes (Signed)
Pt called for room, no answer.

## 2016-04-11 ENCOUNTER — Other Ambulatory Visit: Payer: Self-pay | Admitting: Cardiovascular Disease

## 2016-04-11 ENCOUNTER — Other Ambulatory Visit (HOSPITAL_COMMUNITY): Payer: Self-pay | Admitting: Internal Medicine

## 2016-04-11 DIAGNOSIS — R1031 Right lower quadrant pain: Secondary | ICD-10-CM

## 2016-04-11 NOTE — Telephone Encounter (Signed)
REFILL 

## 2016-05-06 ENCOUNTER — Ambulatory Visit (HOSPITAL_COMMUNITY)
Admission: RE | Admit: 2016-05-06 | Discharge: 2016-05-06 | Disposition: A | Discharge status: Home / Self Care | Payer: Medicaid Other | Source: Ambulatory Visit | Attending: Internal Medicine | Admitting: Internal Medicine

## 2016-05-06 ENCOUNTER — Encounter (HOSPITAL_COMMUNITY): Payer: Self-pay

## 2016-05-06 DIAGNOSIS — K429 Umbilical hernia without obstruction or gangrene: Secondary | ICD-10-CM | POA: Diagnosis not present

## 2016-05-06 DIAGNOSIS — I700 Atherosclerosis of aorta: Secondary | ICD-10-CM | POA: Diagnosis not present

## 2016-05-06 DIAGNOSIS — R1030 Lower abdominal pain, unspecified: Secondary | ICD-10-CM | POA: Diagnosis present

## 2016-05-06 DIAGNOSIS — I2510 Atherosclerotic heart disease of native coronary artery without angina pectoris: Secondary | ICD-10-CM | POA: Insufficient documentation

## 2016-05-06 DIAGNOSIS — R1031 Right lower quadrant pain: Secondary | ICD-10-CM

## 2016-05-06 LAB — POCT I-STAT CREATININE: Creatinine, Ser: 0.7 mg/dL (ref 0.44–1.00)

## 2016-05-06 MED ORDER — IOPAMIDOL (ISOVUE-300) INJECTION 61%
100.0000 mL | Freq: Once | INTRAVENOUS | Status: AC | PRN
  Administered 2016-05-06: 100 mL via INTRAVENOUS

## 2016-05-12 ENCOUNTER — Other Ambulatory Visit: Payer: Self-pay | Admitting: Cardiovascular Disease

## 2016-05-12 NOTE — Telephone Encounter (Signed)
Rx(s) sent to pharmacy electronically.  

## 2016-05-27 ENCOUNTER — Encounter: Payer: Self-pay | Admitting: Internal Medicine

## 2016-06-09 ENCOUNTER — Encounter: Payer: Self-pay | Admitting: Cardiovascular Disease

## 2016-06-09 ENCOUNTER — Ambulatory Visit (INDEPENDENT_AMBULATORY_CARE_PROVIDER_SITE_OTHER): Payer: Medicaid Other | Admitting: Cardiovascular Disease

## 2016-06-09 VITALS — BP 118/70 | HR 90 | Ht 66.0 in | Wt 182.0 lb

## 2016-06-09 DIAGNOSIS — I2510 Atherosclerotic heart disease of native coronary artery without angina pectoris: Secondary | ICD-10-CM

## 2016-06-09 DIAGNOSIS — E785 Hyperlipidemia, unspecified: Secondary | ICD-10-CM | POA: Diagnosis not present

## 2016-06-09 DIAGNOSIS — I351 Nonrheumatic aortic (valve) insufficiency: Secondary | ICD-10-CM | POA: Diagnosis not present

## 2016-06-09 DIAGNOSIS — Z720 Tobacco use: Secondary | ICD-10-CM | POA: Diagnosis not present

## 2016-06-09 DIAGNOSIS — E2740 Unspecified adrenocortical insufficiency: Secondary | ICD-10-CM | POA: Diagnosis not present

## 2016-06-09 DIAGNOSIS — Z79899 Other long term (current) drug therapy: Secondary | ICD-10-CM | POA: Diagnosis not present

## 2016-06-09 NOTE — Patient Instructions (Signed)
Medication Instructions: Dr Croitoru recommends that you continue on your current medications as directed. Please refer to the Current Medication list given to you today.  Labwork: Your physician recommends that you return for lab work at your earliest convenience - FASTING.   Testing/Procedures: NONE ORDERED  Follow-up: Dr Croitoru recommends that you schedule a follow-up appointment in 12 months. You will receive a reminder letter in the mail two months in advance. If you don't receive a letter, please call our office to schedule the follow-up appointment.  If you need a refill on your cardiac medications before your next appointment, please call your pharmacy. 

## 2016-06-09 NOTE — Progress Notes (Signed)
Patient ID: Debra Graves, female   DOB: [birth year], 61 y.o.   MRN: [id number]    Cardiology Office Note    Date:  06/09/2016   ID:  Debra Graves, DOB 1955-07-29, MRN [id number]  PCP:  Redmond School, MD  Cardiologist:   Sanda Klein, MD   Chief complaint: Follow-up hyperlipidemia, nonobstructive CAD   History of Present Illness:  Debra Graves is a 61 y.o. female with moderate coronary artery disease and mild to moderate aortic insufficiency, hyperlipidemia and hypertension, here for yearly follow-up.   The patient specifically denies any chest pain at rest exertion, dyspnea at rest or with exertion, orthopnea, paroxysmal nocturnal dyspnea, syncope, palpitations, focal neurological deficits, intermittent claudication, lower extremity edema, unexplained weight gain, cough, hemoptysis or wheezing.  Primary care physician has restarted treatment with hydrocortisone. Her endocrinology evaluation a year ago suggested partial adrenal insufficiency, but cortisol replacement therapy was not felt to be indicated yet. When she stopped it she felt poorly and decided to restart it. Her weakness has improved.   Past Medical History:  Diagnosis Date  . Anxiety   . Aortic insufficiency 02/22/2013  . Arthritis   . Back pain    bone spurs  . CAD (coronary artery disease) 02/22/2013  . Constipation 04/14/2012  . Coronary artery disease   . Depression   . Fibroids   . Hypercholesteremia   . Mental disorder    anxiety,depression  . Rectocele 04/14/2012    Past Surgical History:  Procedure Laterality Date  . CARDIAC CATHETERIZATION  03/08/2004   mild progressive CAD  . carotid duplex  03/09/2007   rt subclavian 0 to 49%,right bulb 0 to 49 %,lf ICA normal  . COLONOSCOPY N/A 06/16/2013   Dr. Gala Romney: hyperplastic polyps, melanosis coli   . DOPPLER ECHOCARDIOGRAPHY  03/20/2010   EF >55 %,trace mitral regurg,mild tricuspid regurg,aortic valve mildly sclerotic'mild to moderate aortic regurg,trace  pulmonic valver regurg  . NM MYOCAR PERF WALL MOTION  03/19/06   EF 61%,low risk study    Outpatient Medications Prior to Visit  Medication Sig Dispense Refill  . aspirin 81 MG tablet Take 81 mg by mouth daily.    . clonazePAM (KLONOPIN) 0.5 MG tablet Take 0.5 mg by mouth daily.    [name] Calcium (STOOL SOFTENER PO) Take 2 tablets by mouth daily.    . DULoxetine (CYMBALTA) 60 MG capsule Take 60 mg by mouth daily.    Marland Kitchen esomeprazole (NEXIUM) 40 MG capsule TAKE 1 CAPSULE BY MOUTH DAILY BEFORE BREAKFAST. 90 capsule 3  . fish oil-omega-3 fatty acids 1000 MG capsule Take 2 g by mouth daily.    Marland Kitchen HYDROcodone-acetaminophen (NORCO/VICODIN) 5-325 MG per tablet Take 1 tablet by mouth every 4 (four) hours as needed for pain.    Marland Kitchen LORazepam (ATIVAN) 1 MG tablet Take 1 mg by mouth every 6 (six) hours as needed for anxiety.    . Multiple Vitamin (MULTIVITAMIN) capsule Take 1 capsule by mouth daily. Reported on 03/29/2015    . rosuvastatin (CRESTOR) 20 MG tablet Take 1 tablet (20 mg total) by mouth daily. NEEDS APPOINTMENT FOR FUTURE REFILLS 30 tablet 0  . zolpidem (AMBIEN) 10 MG tablet Take 10 mg by mouth at bedtime as needed for sleep. Reported on 06/27/2015     No facility-administered medications prior to visit.      Allergies:   Albuterol; Bactrim [sulfamethoxazole-trimethoprim]; and Vytorin [ezetimibe-simvastatin]   Social History   Social History  . Marital status: Divorced    Spouse  name: N/A  . Number of children: N/A  . Years of education: N/A   Occupational History  . disability Unemployed   Social History Main Topics  . Smoking status: Current Every Day Smoker    Packs/day: 0.50    Years: 41.00    Types: Cigarettes  . Smokeless tobacco: Never Used  . Alcohol use No  . Drug use: No  . Sexual activity: Not Currently    Birth control/ protection: None, Post-menopausal     Comment: once in 9 months   Other Topics Concern  . None   Social History Narrative   Drinks 3 cups of  coffee daily.     Family History:  The patient's family history includes Cancer (age of onset: 77) in her mother; Cancer (age of onset: 80) in her sister; Coronary artery disease in her brother, brother, brother, and father; Heart disease in her father and sister; Hypertension in her sister; Stroke in her brother and father.   ROS:   Please see the history of present illness.    ROS All other systems reviewed and are negative.   PHYSICAL EXAM:   VS:  BP 118/70   Pulse 90   Ht 5\' 6"  (1.676 m)   Wt 182 lb (82.6 kg)   BMI 29.38 kg/m     General: Alert, oriented x3, no distress Head: no evidence of trauma, PERRL, EOMI, no exophtalmos or lid lag, no myxedema, no xanthelasma; normal ears, nose and oropharynx Neck: normal jugular venous pulsations and no hepatojugular reflux; brisk carotid pulses without delay and no carotid bruits Chest: clear to auscultation, no signs of consolidation by percussion or palpation, normal fremitus, symmetrical and full respiratory excursions Cardiovascular: normal position and quality of the apical impulse, regular rhythm, normal first and second heart sounds, no murmurs, rubs or gallops. I cannot hear the murmur of aortic insufficiency even in full expiration, leaning forward. Abdomen: no tenderness or distention, no masses by palpation, no abnormal pulsatility or arterial bruits, normal bowel sounds, no hepatosplenomegaly Extremities: no clubbing, cyanosis or edema; 2+ radial, ulnar and brachial pulses bilaterally; 2+ right femoral, posterior tibial and dorsalis pedis pulses; 2+ left femoral, posterior tibial and dorsalis pedis pulses; no subclavian or femoral bruits Neurological: grossly nonfocal   Wt Readings from Last 3 Encounters:  06/09/16 182 lb (82.6 kg)  06/27/15 169 lb (76.7 kg)  04/13/15 173 lb (78.5 kg)      Studies/Labs Reviewed:   EKG:  EKG is ordered today.  The ekg ordered today demonstrates normal sinus rhythm, normal tracing  Recent  Labs: 01/15/2016: ALT 15; BUN 17; Hemoglobin 14.0; Platelets 259; Potassium 4.3; Sodium 135 05/06/2016: Creatinine, Ser 0.70   Lipid Panel    Component Value Date/Time   CHOL 166 04/09/2015 0957   TRIG 79 04/09/2015 0957   HDL 76 04/09/2015 0957   CHOLHDL 2.6 03/20/2014 0851   VLDL 23 03/20/2014 0851   LDLCALC 74 04/09/2015 0957     ASSESSMENT:    1. Coronary artery disease involving native coronary artery of native heart without angina pectoris   2. Dyslipidemia   3. Nonrheumatic aortic valve insufficiency   4. Tobacco abuse   5. Adrenal insufficiency ([place])   6. Medication management      PLAN:  In order of problems listed above:  1. CAD: Does not have any symptoms to suggest coronary insufficiency at this time. The focus is on risk factor modification, especially smoking cessation. 2. HLP: Tolerating statin without side effects. Time  to recheck her lipid profile 3. AI: She does not have symptoms of heart failure or signs of cardiac enlargement, the murmur is not audible on physical exam. I don't think there is any need to perform routine echo monitoring. 4. Smoking: She reports a plan to start using Nicorette gum to quit smoking permanently. 5. "Partial" adrenal insufficiency: At least symptomatically she seems to improve when taking oral hydrocortisone. I do believe it would be useful for her to have endocrinology follow-up.   Medication Adjustments/Labs and Tests Ordered: Current medicines are reviewed at length with the patient today.  Concerns regarding medicines are outlined above.  Medication changes, Labs and Tests ordered today are listed in the Patient Instructions below. Patient Instructions  Medication Instructions: Dr Sallyanne Kuster recommends that you continue on your current medications as directed. Please refer to the Current Medication list given to you today.  Labwork: Your physician recommends that you return for lab work at your earliest [place].  Testing/Procedures: NONE ORDERED  Follow-up: Dr Sallyanne Kuster recommends that you schedule a follow-up appointment in 12 months. You will receive a reminder letter in the mail two months in advance. If you don't receive a letter, please call our office to schedule the follow-up appointment.  If you need a refill on your cardiac medications before your next appointment, please call your pharmacy.      Signed, Sanda Klein, MD  06/09/2016 9:53 AM    [facility] [place], [place], [place]  [address] Phone: [phone]; Fax: [phone]

## 2016-06-11 ENCOUNTER — Other Ambulatory Visit: Payer: Self-pay | Admitting: Cardiovascular Disease

## 2016-06-11 NOTE — Telephone Encounter (Signed)
REFILL 

## 2016-06-30 ENCOUNTER — Other Ambulatory Visit: Payer: Self-pay | Admitting: Gastroenterology

## 2016-09-19 ENCOUNTER — Other Ambulatory Visit (HOSPITAL_COMMUNITY): Payer: Self-pay | Admitting: Internal Medicine

## 2016-09-19 DIAGNOSIS — Z1231 Encounter for screening mammogram for malignant neoplasm of breast: Secondary | ICD-10-CM

## 2016-09-24 ENCOUNTER — Ambulatory Visit (HOSPITAL_COMMUNITY): Payer: Self-pay

## 2016-09-25 ENCOUNTER — Ambulatory Visit (HOSPITAL_COMMUNITY)
Admission: RE | Admit: 2016-09-25 | Discharge: 2016-09-25 | Disposition: A | Discharge status: Home / Self Care | Payer: Medicaid Other | Source: Ambulatory Visit | Attending: Internal Medicine | Admitting: Internal Medicine

## 2016-09-25 DIAGNOSIS — Z1231 Encounter for screening mammogram for malignant neoplasm of breast: Secondary | ICD-10-CM | POA: Insufficient documentation

## 2016-10-06 ENCOUNTER — Encounter (HOSPITAL_COMMUNITY): Payer: Self-pay | Admitting: Emergency Medicine

## 2016-10-06 ENCOUNTER — Emergency Department (HOSPITAL_COMMUNITY): Payer: Medicaid Other

## 2016-10-06 DIAGNOSIS — Z5321 Procedure and treatment not carried out due to patient leaving prior to being seen by health care provider: Secondary | ICD-10-CM | POA: Insufficient documentation

## 2016-10-06 DIAGNOSIS — R079 Chest pain, unspecified: Secondary | ICD-10-CM | POA: Diagnosis present

## 2016-10-06 NOTE — ED Triage Notes (Signed)
Pt c/o headache that started today with chest pain. Pt states she took 6 baby aspirin and one ativan at home but no relief.

## 2016-10-07 ENCOUNTER — Emergency Department (HOSPITAL_COMMUNITY)
Admission: EM | Admit: 2016-10-07 | Discharge: 2016-10-07 | Disposition: A | Discharge status: Home / Self Care | Payer: Medicaid Other | Attending: Emergency Medicine | Admitting: Emergency Medicine

## 2016-10-07 NOTE — ED Notes (Signed)
Called no answer

## 2017-05-28 ENCOUNTER — Other Ambulatory Visit: Payer: Self-pay | Admitting: Gastroenterology

## 2017-05-28 ENCOUNTER — Other Ambulatory Visit: Payer: Self-pay | Admitting: Cardiovascular Disease

## 2017-05-28 NOTE — Telephone Encounter (Signed)
Rx request sent to pharmacy.  

## 2017-05-31 NOTE — Telephone Encounter (Signed)
Patient needs ov for further refills 

## 2017-06-01 ENCOUNTER — Encounter: Payer: Self-pay | Admitting: Internal Medicine

## 2017-07-21 ENCOUNTER — Encounter: Payer: Self-pay | Admitting: Cardiology

## 2017-07-21 NOTE — Progress Notes (Signed)
Cardiology Office Note  Date: 07/22/2017   ID: Debra Graves, DOB 1955-06-08, MRN [id number]  PCP: Redmond School, MD  Primary Cardiologist: Rozann Lesches, MD   Chief Complaint  Patient presents with  . Coronary Artery Disease    History of Present Illness: Debra Graves is a 62 y.o. female patient of Dr. Sallyanne Kuster, now establishing follow-up with me in clinic.  She was last seen in May 2018.  She has a history of coronary artery disease including moderate ostial RCA disease documented in 2006 that has been managed medically, also valvular heart disease with moderate aortic regurgitation.  She presents today for a routine visit.  She does not report any angina symptoms, describes NYHA class II dyspnea.  She states that she was not as active over the winter due to depression, but she is working with Dr. Gerarda Fraction and has been getting outside more to walk.  Last echocardiogram was in 2015 as noted below.  I reviewed her current medications.  Cardiac regimen includes aspirin and Crestor.  We are requesting her last lipid panel from PCP.  She had a recent ECG in May which was normal.  Past Medical History:  Diagnosis Date  . Adrenal insufficiency ([place])   . Anxiety   . Aortic insufficiency    Moderate 2015  . Arthritis   . Back pain   . CAD (coronary artery disease)    Moderate ostial RCA disease at cardiac catheterization 2006  . Constipation   . Depression   . Fibroids   . Hypercholesteremia   . Rectocele     Past Surgical History:  Procedure Laterality Date  . CARDIAC CATHETERIZATION  03/08/2004   mild progressive CAD  . carotid duplex  03/09/2007   rt subclavian 0 to 49%,right bulb 0 to 49 %,lf ICA normal  . COLONOSCOPY N/A 06/16/2013   Dr. Gala Romney: hyperplastic polyps, melanosis coli   . DOPPLER ECHOCARDIOGRAPHY  03/20/2010   EF >55 %,trace mitral regurg,mild tricuspid regurg,aortic valve mildly sclerotic'mild to moderate aortic regurg,trace pulmonic valver regurg  .  NM MYOCAR PERF WALL MOTION  03/19/06   EF 61%,low risk study    Current Outpatient Medications  Medication Sig Dispense Refill  . aspirin 81 MG tablet Take 81 mg by mouth daily.    . clonazePAM (KLONOPIN) 0.5 MG tablet Take 0.5 mg by mouth daily.    [name] Calcium (STOOL SOFTENER PO) Take 2 tablets by mouth daily.    Marland Kitchen escitalopram (LEXAPRO) 20 MG tablet Take 20 mg by mouth daily.    Marland Kitchen esomeprazole (NEXIUM) 40 MG capsule TAKE 1 CAPSULE BY MOUTH DAILY BEFORE BREAKFAST. 30 capsule 2  . fish oil-omega-3 fatty acids 1000 MG capsule Take 2 g by mouth daily.    Marland Kitchen HYDROcodone-acetaminophen (NORCO/VICODIN) 5-325 MG per tablet Take 1 tablet by mouth every 4 (four) hours as needed for pain.    . hydrocortisone (CORTEF) 10 MG tablet Take 10 mg by mouth 2 (two) times daily.    Marland Kitchen LORazepam (ATIVAN) 1 MG tablet Take 1 mg by mouth every 6 (six) hours as needed for anxiety.    . Multiple Vitamin (MULTIVITAMIN) capsule Take 1 capsule by mouth daily. Reported on 03/29/2015    . rosuvastatin (CRESTOR) 20 MG tablet TAKE (1) TABLET BY MOUTH AT BEDTIME. 30 tablet 0  . Vitamin D, Ergocalciferol, 2000 units CAPS Take by mouth.     No current facility-administered medications for this visit.    Allergies:  Albuterol; Bactrim [sulfamethoxazole-trimethoprim];  and Vytorin [ezetimibe-simvastatin]   Social History: The patient  reports that she has been smoking cigarettes.  She has a 10.25 pack-year smoking history. She has never used smokeless tobacco. She reports that she does not drink alcohol or use drugs.   Family History: The patient's family history includes Breast cancer (age of onset: 20) in her sister; Cancer (age of onset: 33) in her mother; Coronary artery disease in her brother, brother, brother, and father; Heart disease in her father and sister; Hypertension in her sister; Stroke in her brother and father.   ROS:  Please see the history of present illness. Otherwise, complete review of systems is  positive for none.  All other systems are reviewed and negative.   Physical Exam: VS:  BP 122/68   Pulse 82   Ht 5\' 6"  (1.676 m)   Wt 199 lb 12.8 oz (90.6 kg)   SpO2 93%   BMI 32.25 kg/m , BMI Body mass index is 32.25 kg/m.  Wt Readings from Last 3 Encounters:  07/22/17 199 lb 12.8 oz (90.6 kg)  10/06/16 188 lb (85.3 kg)  06/09/16 182 lb (82.6 kg)    General: Patient appears comfortable at rest. HEENT: Conjunctiva and lids normal, oropharynx clear. Neck: Supple, no elevated JVP or carotid bruits, no thyromegaly. Lungs: Clear to auscultation, nonlabored breathing at rest. Cardiac: Regular rate and rhythm, no S3, no obvious diastolic murmur, no pericardial rub. Abdomen: Soft, nontender, bowel sounds present. Extremities: No pitting edema, distal pulses 2+. Skin: Warm and dry. Musculoskeletal: No kyphosis. Neuropsychiatric: Alert and oriented x3, affect grossly appropriate.  ECG: I personally reviewed the tracing from May 2019 which showed sinus rhythm.  Recent Labwork:    Component Value Date/Time   CHOL 166 04/09/2015 0957   TRIG 79 04/09/2015 0957   HDL 76 04/09/2015 0957   CHOLHDL 2.6 03/20/2014 0851   VLDL 23 03/20/2014 0851   LDLCALC 74 04/09/2015 0957  May 2019: Hemoglobin 13.7, platelets 274, BUN 17, creatinine 0.91, potassium 4.3, AST 19, ALT 15, TSH 1.42  Other Studies Reviewed Today:  Echocardiogram 03/01/2013: Study Conclusions  - Left ventricle: The cavity size was normal. Wall thickness was normal. Systolic function was normal. The estimated ejection fraction was in the range of 55% to 60%. Wall motion was normal; there were no regional wall motion abnormalities. Doppler parameters are consistent with abnormal left ventricular relaxation (grade 1 diastolic dysfunction). - Aortic valve: Trileaflet. Normal appearing valve leaflets. There was no stenosis. Moderate regurgitation. - Mitral valve: Structurally normal valve.  Trivial regurgitation. - Left atrium: LA volume/ BSA = 16.9 ml/m2 The atrium was normal in size. - Right ventricle: RV systolic pressure: 37mm Hg (S, est). - Tricuspid valve: Poorly visualized. Trivial regurgitation. - Inferior vena cava: The vessel was normal in size; the respirophasic diameter changes were in the normal range (= 50%); findings are consistent with normal central venous pressure. - Pericardium, extracardiac: There was no pericardial effusion.  Cardiac catheterization 03/08/2004: LVEF 60%, approximately 60 to 70% ostial RCA stenosis with calcification, mild diffuse calcification within the left main, mild atherosclerosis of 20 to 30% in the LAD, mild luminal irregularities within the circumflex, normal ramus intermedius.  Assessment and Plan:  1.  History of moderate ostial RCA disease, clinically stable without angina symptoms.  Recent ECG reviewed and normal.  I encouraged regular walking for exercise regimen.  She is on aspirin and statin therapy.  No clear indication for follow-up ischemic testing at this time.  2.  History  of moderate aortic regurgitation based on assessment in 2015.  She does not have a significant diastolic murmur on today's examination.  Plan is to obtain a follow-up echocardiogram in comparison to study from 4 years ago.  If findings have not progressed, we can follow her clinically going forward.  3.  Mixed hyperlipidemia, on Crestor.  Requesting last lipid panel from Dr. Gerarda Fraction.  4.  Tobacco use.  Smoking cessation discussed.  Current medicines were reviewed with the patient today.   Orders Placed This Encounter  Procedures  . ECHOCARDIOGRAM COMPLETE    Disposition: Follow-up in 1 year.  Signed, Satira Sark, MD, [facility] 07/22/2017 10:17 AM    [facility] [place]. [address] [place], [place] [address] Phone: [phone]; Fax: [phone]

## 2017-07-22 ENCOUNTER — Telehealth: Payer: Self-pay | Admitting: Cardiology

## 2017-07-22 ENCOUNTER — Encounter: Payer: Self-pay | Admitting: Cardiology

## 2017-07-22 ENCOUNTER — Ambulatory Visit: Payer: Medicaid Other | Admitting: Cardiology

## 2017-07-22 ENCOUNTER — Encounter: Payer: Self-pay | Admitting: *Deleted

## 2017-07-22 VITALS — BP 122/68 | HR 82 | Ht 66.0 in | Wt 199.8 lb

## 2017-07-22 DIAGNOSIS — I351 Nonrheumatic aortic (valve) insufficiency: Secondary | ICD-10-CM

## 2017-07-22 DIAGNOSIS — Z720 Tobacco use: Secondary | ICD-10-CM

## 2017-07-22 DIAGNOSIS — I2510 Atherosclerotic heart disease of native coronary artery without angina pectoris: Secondary | ICD-10-CM

## 2017-07-22 DIAGNOSIS — E782 Mixed hyperlipidemia: Secondary | ICD-10-CM | POA: Diagnosis not present

## 2017-07-22 NOTE — Telephone Encounter (Signed)
2 D Echo dx: aortic regurgitation Scheduled at [facility] July 28, 2017

## 2017-07-22 NOTE — Patient Instructions (Addendum)

## 2017-07-27 ENCOUNTER — Other Ambulatory Visit: Payer: Self-pay | Admitting: Cardiology

## 2017-07-28 ENCOUNTER — Ambulatory Visit (HOSPITAL_COMMUNITY)
Admission: RE | Admit: 2017-07-28 | Discharge: 2017-07-28 | Disposition: A | Discharge status: Home / Self Care | Payer: Medicaid Other | Source: Ambulatory Visit | Attending: Cardiology | Admitting: Cardiology

## 2017-07-28 DIAGNOSIS — E7800 Pure hypercholesterolemia, unspecified: Secondary | ICD-10-CM | POA: Insufficient documentation

## 2017-07-28 DIAGNOSIS — I2510 Atherosclerotic heart disease of native coronary artery without angina pectoris: Secondary | ICD-10-CM | POA: Diagnosis not present

## 2017-07-28 DIAGNOSIS — K219 Gastro-esophageal reflux disease without esophagitis: Secondary | ICD-10-CM | POA: Insufficient documentation

## 2017-07-28 DIAGNOSIS — F17200 Nicotine dependence, unspecified, uncomplicated: Secondary | ICD-10-CM | POA: Diagnosis not present

## 2017-07-28 DIAGNOSIS — I351 Nonrheumatic aortic (valve) insufficiency: Secondary | ICD-10-CM | POA: Insufficient documentation

## 2017-07-28 DIAGNOSIS — I517 Cardiomegaly: Secondary | ICD-10-CM | POA: Insufficient documentation

## 2017-07-28 NOTE — Progress Notes (Signed)
*  PRELIMINARY RESULTS* Echocardiogram 2D Echocardiogram has been performed.  Debra Graves 07/28/2017, 11:16 AM

## 2017-08-19 ENCOUNTER — Encounter: Payer: Self-pay | Admitting: Gastroenterology

## 2017-08-19 ENCOUNTER — Ambulatory Visit (HOSPITAL_COMMUNITY)
Admission: RE | Admit: 2017-08-19 | Discharge: 2017-08-19 | Disposition: A | Discharge status: Home / Self Care | Payer: Medicaid Other | Source: Ambulatory Visit | Attending: Neurology | Admitting: Neurology

## 2017-08-19 ENCOUNTER — Other Ambulatory Visit (HOSPITAL_COMMUNITY): Payer: Self-pay | Admitting: Neurology

## 2017-08-19 ENCOUNTER — Ambulatory Visit: Payer: Medicaid Other | Admitting: Gastroenterology

## 2017-08-19 VITALS — BP 129/75 | HR 85 | Temp 97.5°F | Ht 66.0 in | Wt 201.4 lb

## 2017-08-19 DIAGNOSIS — M4316 Spondylolisthesis, lumbar region: Secondary | ICD-10-CM | POA: Insufficient documentation

## 2017-08-19 DIAGNOSIS — M5136 Other intervertebral disc degeneration, lumbar region: Secondary | ICD-10-CM | POA: Diagnosis not present

## 2017-08-19 DIAGNOSIS — M545 Low back pain: Secondary | ICD-10-CM | POA: Diagnosis present

## 2017-08-19 DIAGNOSIS — K219 Gastro-esophageal reflux disease without esophagitis: Secondary | ICD-10-CM | POA: Diagnosis not present

## 2017-08-19 MED ORDER — ESOMEPRAZOLE MAGNESIUM 40 MG PO CPDR
DELAYED_RELEASE_CAPSULE | ORAL | 3 refills | Status: DC
Start: 2017-08-19 — End: 2018-05-25

## 2017-08-19 NOTE — Assessment & Plan Note (Addendum)
Doing well on daily Nexium.   Return to the office in 2 years or call sooner as needed. RX for 90-day supply with 3 refills provided today.

## 2017-08-19 NOTE — Patient Instructions (Signed)
1. Continue Nexium 40 mg daily before breakfast. 2. Return to the office in 2 years or call sooner if needed.

## 2017-08-19 NOTE — Progress Notes (Signed)
      Primary Care Physician: Redmond School, MD  Primary Gastroenterologist:  Garfield Cornea, MD   Chief Complaint  Patient presents with  . Gastroesophageal Reflux    needs refill    HPI: Debra Graves is a 62 y.o. female here for 2-year follow-up of GERD and constipation.  Last seen in June 2017.  Next colonoscopy due in 2025.  Reflux well controlled on Nexium.  If she misses a day she has recurrent symptoms.  Denies dysphagia, abdominal pain.  Managing constipation with dietary fiber.  Currently having daily bowel movements.  No melena or rectal bleeding.  Appetite is good.  Current Outpatient Medications  Medication Sig Dispense Refill  . aspirin 81 MG tablet Take 81 mg by mouth daily.    . clonazePAM (KLONOPIN) 0.5 MG tablet Take 0.5 mg by mouth daily.    [name] Calcium (STOOL SOFTENER PO) Take 2 tablets by mouth daily.    Marland Kitchen escitalopram (LEXAPRO) 20 MG tablet Take 20 mg by mouth daily.    Marland Kitchen esomeprazole (NEXIUM) 40 MG capsule TAKE 1 CAPSULE BY MOUTH DAILY BEFORE BREAKFAST. 30 capsule 2  . fish oil-omega-3 fatty acids 1000 MG capsule Take 2 g by mouth daily.    Marland Kitchen HYDROcodone-acetaminophen (NORCO/VICODIN) 5-325 MG per tablet Take 1 tablet by mouth every 4 (four) hours as needed for pain.    . hydrocortisone (CORTEF) 10 MG tablet Take 10 mg by mouth 2 (two) times daily.    Marland Kitchen LORazepam (ATIVAN) 1 MG tablet Take 1 mg by mouth every 6 (six) hours as needed for anxiety.    . Multiple Vitamin (MULTIVITAMIN) capsule Take 1 capsule by mouth daily. Reported on 03/29/2015    . rosuvastatin (CRESTOR) 20 MG tablet TAKE (1) TABLET BY MOUTH AT BEDTIME. 90 tablet 3  . Vitamin D, Ergocalciferol, 2000 units CAPS Take by mouth daily.      No current facility-administered medications for this visit.     Allergies as of 08/19/2017 - Review Complete 08/19/2017  Allergen Reaction Noted  . Albuterol  04/14/2012  . Bactrim [sulfamethoxazole-trimethoprim]  04/14/2012  . Vytorin  [ezetimibe-simvastatin]  03/25/2013    ROS:  General: Negative for anorexia, weight loss, fever, chills, fatigue, weakness. ENT: Negative for hoarseness, difficulty swallowing , nasal congestion. CV: Negative for chest pain, angina, palpitations, dyspnea on exertion, peripheral edema.  Respiratory: Negative for dyspnea at rest, dyspnea on exertion, cough, sputum, wheezing.  GI: See history of present illness. GU:  Negative for dysuria, hematuria, urinary incontinence, urinary frequency, nocturnal urination.  Endo: Negative for unusual weight change.    Physical Examination:   BP 129/75   Pulse 85   Temp (!) 97.5 F (36.4 C) (Oral)   Ht 5\' 6"  (1.676 m)   Wt 201 lb 6.4 oz (91.4 kg)   BMI 32.51 kg/m   General: Well-nourished, well-developed in no acute distress.  Eyes: No icterus. Mouth: Oropharyngeal mucosa moist and pink , no lesions erythema or exudate. Lungs: Clear to auscultation bilaterally.  Heart: Regular rate and rhythm, no murmurs rubs or gallops.  Abdomen: Bowel sounds are normal, nontender, nondistended, no hepatosplenomegaly or masses, no abdominal bruits or hernia , no rebound or guarding.   Extremities: No lower extremity edema. No clubbing or deformities. Neuro: Alert and oriented x 4   Skin: Warm and dry, no jaundice.   Psych: Alert and cooperative, normal mood and affect.   Imaging Studies: No results found.

## 2017-08-20 NOTE — Progress Notes (Signed)
cc'ed to pcp °

## 2017-09-10 ENCOUNTER — Other Ambulatory Visit (HOSPITAL_COMMUNITY): Payer: Self-pay | Admitting: Internal Medicine

## 2017-09-10 DIAGNOSIS — Z1231 Encounter for screening mammogram for malignant neoplasm of breast: Secondary | ICD-10-CM

## 2017-09-28 ENCOUNTER — Ambulatory Visit (HOSPITAL_COMMUNITY)
Admission: RE | Admit: 2017-09-28 | Discharge: 2017-09-28 | Disposition: A | Discharge status: Home / Self Care | Payer: Medicaid Other | Source: Ambulatory Visit | Attending: Internal Medicine | Admitting: Internal Medicine

## 2017-09-28 DIAGNOSIS — Z1231 Encounter for screening mammogram for malignant neoplasm of breast: Secondary | ICD-10-CM | POA: Diagnosis not present

## 2018-05-25 ENCOUNTER — Other Ambulatory Visit: Payer: Self-pay | Admitting: Gastroenterology

## 2018-07-21 ENCOUNTER — Other Ambulatory Visit: Payer: Self-pay

## 2018-07-21 ENCOUNTER — Other Ambulatory Visit (HOSPITAL_COMMUNITY): Payer: Self-pay | Admitting: Internal Medicine

## 2018-07-21 ENCOUNTER — Ambulatory Visit (HOSPITAL_COMMUNITY)
Admission: RE | Admit: 2018-07-21 | Discharge: 2018-07-21 | Disposition: A | Discharge status: Home / Self Care | Payer: Medicaid Other | Source: Ambulatory Visit | Attending: Internal Medicine | Admitting: Internal Medicine

## 2018-07-21 DIAGNOSIS — M79672 Pain in left foot: Secondary | ICD-10-CM

## 2018-08-03 ENCOUNTER — Other Ambulatory Visit: Payer: Self-pay | Admitting: Cardiology

## 2018-11-15 ENCOUNTER — Other Ambulatory Visit (HOSPITAL_COMMUNITY): Payer: Self-pay | Admitting: Internal Medicine

## 2018-11-15 DIAGNOSIS — Z1231 Encounter for screening mammogram for malignant neoplasm of breast: Secondary | ICD-10-CM

## 2018-11-18 ENCOUNTER — Ambulatory Visit (HOSPITAL_COMMUNITY): Payer: Medicaid Other

## 2018-11-24 ENCOUNTER — Ambulatory Visit (HOSPITAL_COMMUNITY)
Admission: RE | Admit: 2018-11-24 | Discharge: 2018-11-24 | Disposition: A | Discharge status: Home / Self Care | Payer: Medicaid Other | Source: Ambulatory Visit | Attending: Internal Medicine | Admitting: Internal Medicine

## 2018-11-24 ENCOUNTER — Other Ambulatory Visit: Payer: Self-pay

## 2018-11-24 DIAGNOSIS — Z1231 Encounter for screening mammogram for malignant neoplasm of breast: Secondary | ICD-10-CM | POA: Insufficient documentation

## 2018-12-05 NOTE — Progress Notes (Signed)
Virtual Visit via Telephone Note   This visit type was conducted due to national recommendations for restrictions regarding the COVID-19 Pandemic (e.g. social distancing) in an effort to limit this patient's exposure and mitigate transmission in our community.  Due to her co-morbid illnesses, this patient is at least at moderate risk for complications without adequate follow up.  This format is felt to be most appropriate for this patient at this time.  The patient did not have access to video technology/had technical difficulties with video requiring transitioning to audio format only (telephone).  All issues noted in this document were discussed and addressed.  No physical exam could be performed with this format.  Please refer to the patient's chart for her  consent to telehealth for [facility].   Date:  12/06/2018   ID:  Debra Graves, DOB 20-May-1955, MRN [id number]  Patient Location: Home Provider Location: Office  PCP:  Redmond School, MD  Cardiologist:  Rozann Lesches, MD Electrophysiologist:  None   Evaluation Performed:  Follow-Up Visit  Chief Complaint:   Cardiac follow-up  History of Present Illness:    Debra Graves is a 64 y.o. female last seen in July 2019.  We spoke by phone today.  She does not report any exertional chest pain or palpitations.  Does get dyspnea on exertion at times, made worse by chronic back pain.  She does not report any orthopnea or PND.  Follow-up echocardiogram in July 2019 revealed LVEF 55 to 60% with mild LVH, mild diastolic dysfunction, and moderate aortic regurgitation which was stable.  I reviewed her medications.  She continues on aspirin and Crestor.  Plan to request interval lab work from Dr. Gerarda Fraction.  The patient does not have symptoms concerning for COVID-19 infection (fever, chills, cough, or new shortness of breath).    Past Medical History:  Diagnosis Date  . Adrenal insufficiency ([place])   . Anxiety   . Aortic  insufficiency    Moderate 2015  . Arthritis   . Back pain   . CAD (coronary artery disease)    Moderate ostial RCA disease at cardiac catheterization 2006  . Constipation   . Depression   . Fibroids   . Hypercholesteremia   . Rectocele    Past Surgical History:  Procedure Laterality Date  . CARDIAC CATHETERIZATION  03/08/2004   mild progressive CAD  . carotid duplex  03/09/2007   rt subclavian 0 to 49%,right bulb 0 to 49 %,lf ICA normal  . COLONOSCOPY N/A 06/16/2013   Dr. Gala Romney: hyperplastic polyps, melanosis coli   . DOPPLER ECHOCARDIOGRAPHY  03/20/2010   EF >55 %,trace mitral regurg,mild tricuspid regurg,aortic valve mildly sclerotic'mild to moderate aortic regurg,trace pulmonic valver regurg  . NM MYOCAR PERF WALL MOTION  03/19/06   EF 61%,low risk study     Current Meds  Medication Sig  . aspirin 81 MG tablet Take 81 mg by mouth daily.  . clonazePAM (KLONOPIN) 0.5 MG tablet Take 0.5 mg by mouth daily.  [name] Calcium (STOOL SOFTENER PO) Take 2 tablets by mouth daily.  Marland Kitchen escitalopram (LEXAPRO) 20 MG tablet Take 20 mg by mouth daily.  Marland Kitchen esomeprazole (NEXIUM) 40 MG capsule TAKE 1 CAPSULE BY MOUTH DAILY BEFORE BREAKFAST.  . fish oil-omega-3 fatty acids 1000 MG capsule Take 2 g by mouth daily.  . hydrocortisone (CORTEF) 10 MG tablet Take 10 mg by mouth 2 (two) times daily.  Marland Kitchen LORazepam (ATIVAN) 1 MG tablet Take 1 mg by mouth every 6 (  six) hours as needed for anxiety.  . meloxicam (MOBIC) 7.5 MG tablet Take 7.5 mg by mouth 2 (two) times daily.  . rosuvastatin (CRESTOR) 20 MG tablet TAKE (1) TABLET BY MOUTH AT BEDTIME.  Marland Kitchen Vitamin D, Ergocalciferol, 2000 units CAPS Take by mouth daily.      Allergies:   Albuterol, Bactrim [sulfamethoxazole-trimethoprim], and Vytorin [ezetimibe-simvastatin]   Social History   Tobacco Use  . Smoking status: Former Smoker    Packs/day: 0.25    Years: 41.00    Pack years: 10.25    Types: Cigarettes  . Smokeless tobacco: Never Used   Substance Use Topics  . Alcohol use: No  . Drug use: No     Family Hx: The patient's family history includes Breast cancer (age of onset: 14) in her sister; Cancer (age of onset: 45) in her mother; Coronary artery disease in her brother, brother, brother, and father; Heart disease in her father and sister; Hypertension in her sister; Stroke in her brother and father. There is no history of Colon cancer.  ROS:   Please see the history of present illness.    Chronic back pain, sometimes uses a brace. All other systems reviewed and are negative.   Prior CV studies:   The following studies were reviewed today:  Echocardiogram 07/28/2017: Study Conclusions  - Left ventricle: The cavity size was normal. Wall thickness was   increased in a pattern of mild LVH. Systolic function was normal.   The estimated ejection fraction was in the range of 55% to 60%.   Wall motion was normal; there were no regional wall motion   abnormalities. Doppler parameters are consistent with abnormal   left ventricular relaxation (grade 1 diastolic dysfunction). - Aortic valve: There was moderate regurgitation. Valve area (VTI):   2.01 cm^2. Valve area (Vmax): 2.11 cm^2. Valve area (Vmean): 2.24   cm^2. - Technically adequate study.  Labs/Other Tests and Data Reviewed:    EKG:  An ECG dated 07/22/2017 was personally reviewed today and demonstrated:  Normal sinus rhythm.  Recent Labs:  May 2019: Hemoglobin 13.7, platelets 274, BUN 17, creatinine 0.91, potassium 4.3, AST 19, ALT 15, TSH 1.42  Wt Readings from Last 3 Encounters:  12/06/18 211 lb (95.7 kg)  08/19/17 201 lb 6.4 oz (91.4 kg)  07/22/17 199 lb 12.8 oz (90.6 kg)     Objective:    Vital Signs:  BP 130/72   Pulse 83   Ht 5\' 6"  (1.676 m)   Wt 211 lb (95.7 kg)   BMI 34.06 kg/m    Patient spoke in full sentences, not short of breath. No audible wheezing or coughing.  ASSESSMENT & PLAN:    1.  Moderate aortic regurgitation, last  echocardiogram was in July 2019.  LVEF normal at that time with mild LVH and normal chamber size.  In light of intermittent dyspnea on exertion we will obtain a follow-up study to ensure stability.  2.  CAD, moderate ostial RCA disease that has been managed medically.  She does not report any obvious angina symptoms.  Continue aspirin and Crestor.  Follow-up ECG for next visit.  3.  Mixed hyperlipidemia, on Crestor.  Requesting interval lab work from Dr. Gerarda Fraction.   COVID-19 Education: The signs and symptoms of COVID-19 were discussed with the patient and how to seek care for testing (follow up with PCP or arrange E-visit).  The importance of social distancing was discussed today.  Time:   Today, I have spent 7 minutes with  the patient with telehealth technology discussing the above problems.     Medication Adjustments/Labs and Tests Ordered: Current medicines are reviewed at length with the patient today.  Concerns regarding medicines are outlined above.   Tests Ordered: Orders Placed This Encounter  Procedures  . ECHOCARDIOGRAM COMPLETE    Medication Changes: No orders of the defined types were placed in this encounter.   Follow Up:  In Person 1 year in the [place] office.  Signed, Rozann Lesches, MD  12/06/2018 8:41 AM    [place]

## 2018-12-06 ENCOUNTER — Encounter: Payer: Self-pay | Admitting: *Deleted

## 2018-12-06 ENCOUNTER — Telehealth (INDEPENDENT_AMBULATORY_CARE_PROVIDER_SITE_OTHER): Payer: Medicaid Other | Admitting: Cardiology

## 2018-12-06 ENCOUNTER — Encounter: Payer: Self-pay | Admitting: Cardiology

## 2018-12-06 ENCOUNTER — Telehealth: Payer: Self-pay | Admitting: Cardiology

## 2018-12-06 VITALS — BP 130/72 | HR 83 | Ht 66.0 in | Wt 211.0 lb

## 2018-12-06 DIAGNOSIS — Z720 Tobacco use: Secondary | ICD-10-CM

## 2018-12-06 DIAGNOSIS — E782 Mixed hyperlipidemia: Secondary | ICD-10-CM | POA: Diagnosis not present

## 2018-12-06 DIAGNOSIS — I2510 Atherosclerotic heart disease of native coronary artery without angina pectoris: Secondary | ICD-10-CM | POA: Diagnosis not present

## 2018-12-06 DIAGNOSIS — E2740 Unspecified adrenocortical insufficiency: Secondary | ICD-10-CM

## 2018-12-06 DIAGNOSIS — I351 Nonrheumatic aortic (valve) insufficiency: Secondary | ICD-10-CM

## 2018-12-06 NOTE — Patient Instructions (Addendum)
Medication Instructions:   Your physician recommends that you continue on your current medications as directed. Please refer to the Current Medication list given to you today.  Labwork:  NONE  Testing/Procedures: Your physician has requested that you have an echocardiogram. Echocardiography is a painless test that uses sound waves to create images of your heart. It provides your doctor with information about the size and shape of your heart and how well your heart's chambers and valves are working. This procedure takes approximately one hour. There are no restrictions for this procedure.  Follow-Up:  Your physician recommends that you schedule a follow-up appointment in: 1 year at the [place] office. You will receive a reminder letter in the mail in about 10 months reminding you to call and schedule your appointment. If you don't receive this letter, please contact our office.  Any Other Special Instructions Will Be Listed Below (If Applicable).  If you need a refill on your cardiac medications before your next appointment, please call your pharmacy. 

## 2018-12-06 NOTE — Telephone Encounter (Signed)
°  Precert needed for: 2 D Echo dx: aortic regurgitation ([facility])   Location: [facility]    Date: Dec 13, 2018

## 2018-12-13 ENCOUNTER — Ambulatory Visit (HOSPITAL_COMMUNITY)
Admission: RE | Admit: 2018-12-13 | Discharge: 2018-12-13 | Disposition: A | Discharge status: Home / Self Care | Payer: Medicaid Other | Source: Ambulatory Visit | Attending: Cardiology | Admitting: Cardiology

## 2018-12-13 ENCOUNTER — Other Ambulatory Visit: Payer: Self-pay

## 2018-12-13 DIAGNOSIS — I351 Nonrheumatic aortic (valve) insufficiency: Secondary | ICD-10-CM

## 2018-12-13 NOTE — Progress Notes (Signed)
*  PRELIMINARY RESULTS* Echocardiogram 2D Echocardiogram has been performed.  Samuel Germany 12/13/2018, 12:34 PM

## 2019-05-19 ENCOUNTER — Other Ambulatory Visit: Payer: Self-pay | Admitting: Gastroenterology

## 2019-06-21 ENCOUNTER — Encounter: Payer: Self-pay | Admitting: Internal Medicine

## 2019-08-01 DIAGNOSIS — E271 Primary adrenocortical insufficiency: Secondary | ICD-10-CM | POA: Diagnosis not present

## 2019-08-01 DIAGNOSIS — M159 Polyosteoarthritis, unspecified: Secondary | ICD-10-CM | POA: Diagnosis not present

## 2019-08-01 DIAGNOSIS — Z23 Encounter for immunization: Secondary | ICD-10-CM | POA: Diagnosis not present

## 2019-08-01 DIAGNOSIS — E039 Hypothyroidism, unspecified: Secondary | ICD-10-CM | POA: Diagnosis not present

## 2019-08-01 DIAGNOSIS — Z1389 Encounter for screening for other disorder: Secondary | ICD-10-CM | POA: Diagnosis not present

## 2019-08-01 DIAGNOSIS — K219 Gastro-esophageal reflux disease without esophagitis: Secondary | ICD-10-CM | POA: Diagnosis not present

## 2019-08-01 DIAGNOSIS — R739 Hyperglycemia, unspecified: Secondary | ICD-10-CM | POA: Diagnosis not present

## 2019-08-01 DIAGNOSIS — I2510 Atherosclerotic heart disease of native coronary artery without angina pectoris: Secondary | ICD-10-CM | POA: Diagnosis not present

## 2019-08-01 DIAGNOSIS — I10 Essential (primary) hypertension: Secondary | ICD-10-CM | POA: Diagnosis not present

## 2019-08-01 DIAGNOSIS — Z0001 Encounter for general adult medical examination with abnormal findings: Secondary | ICD-10-CM | POA: Diagnosis not present

## 2019-08-01 DIAGNOSIS — Z719 Counseling, unspecified: Secondary | ICD-10-CM | POA: Diagnosis not present

## 2019-08-08 ENCOUNTER — Other Ambulatory Visit: Payer: Self-pay | Admitting: Cardiology

## 2019-11-22 DIAGNOSIS — Z23 Encounter for immunization: Secondary | ICD-10-CM | POA: Diagnosis not present

## 2019-11-22 DIAGNOSIS — M7981 Nontraumatic hematoma of soft tissue: Secondary | ICD-10-CM | POA: Diagnosis not present

## 2019-11-22 DIAGNOSIS — R109 Unspecified abdominal pain: Secondary | ICD-10-CM | POA: Diagnosis not present

## 2019-11-22 DIAGNOSIS — R609 Edema, unspecified: Secondary | ICD-10-CM | POA: Diagnosis not present

## 2019-11-22 DIAGNOSIS — I2510 Atherosclerotic heart disease of native coronary artery without angina pectoris: Secondary | ICD-10-CM | POA: Diagnosis not present

## 2019-12-01 ENCOUNTER — Other Ambulatory Visit: Payer: Self-pay | Admitting: Gastroenterology

## 2019-12-01 NOTE — Telephone Encounter (Signed)
Noted. Spoke with pt and scheduled a f/u for 01/23/20. Pt is aware that Nexium has been refilled.

## 2019-12-01 NOTE — Telephone Encounter (Signed)
Patient hasn't been seen since 2019. I am sending in a 3 month supply of Nexium, but she will need OV for additional refills.

## 2019-12-07 ENCOUNTER — Other Ambulatory Visit (HOSPITAL_COMMUNITY): Payer: Self-pay | Admitting: Internal Medicine

## 2019-12-07 DIAGNOSIS — Z1231 Encounter for screening mammogram for malignant neoplasm of breast: Secondary | ICD-10-CM

## 2020-01-06 ENCOUNTER — Other Ambulatory Visit: Payer: Self-pay

## 2020-01-06 ENCOUNTER — Ambulatory Visit (HOSPITAL_COMMUNITY)
Admission: RE | Admit: 2020-01-06 | Discharge: 2020-01-06 | Disposition: A | Discharge status: Home / Self Care | Payer: Medicare Other | Source: Ambulatory Visit | Attending: Internal Medicine | Admitting: Internal Medicine

## 2020-01-06 DIAGNOSIS — Z1231 Encounter for screening mammogram for malignant neoplasm of breast: Secondary | ICD-10-CM | POA: Insufficient documentation

## 2020-01-22 NOTE — Progress Notes (Deleted)
Primary Care Physician:  Elfredia Nevins, MD  Primary Gastroenterologist:  Roetta Sessions, MD   No chief complaint on file.   HPI:  Debra Graves is a 65 y.o. female here for follow up GERD, constipation. Last seen in 2019.  Current Outpatient Medications  Medication Sig Dispense Refill  . aspirin 81 MG tablet Take 81 mg by mouth daily.    . clonazePAM (KLONOPIN) 0.5 MG tablet Take 0.5 mg by mouth daily.    Tery Sanfilippo Calcium (STOOL SOFTENER PO) Take 2 tablets by mouth daily.    Marland Kitchen escitalopram (LEXAPRO) 20 MG tablet Take 20 mg by mouth daily.    Marland Kitchen esomeprazole (NEXIUM) 40 MG capsule TAKE 1 CAPSULE BY MOUTH DAILY BEFORE BREAKFAST. 30 capsule 2  . fish oil-omega-3 fatty acids 1000 MG capsule Take 2 g by mouth daily.    . hydrocortisone (CORTEF) 10 MG tablet Take 10 mg by mouth 2 (two) times daily.    Marland Kitchen LORazepam (ATIVAN) 1 MG tablet Take 1 mg by mouth every 6 (six) hours as needed for anxiety.    . meloxicam (MOBIC) 7.5 MG tablet Take 7.5 mg by mouth 2 (two) times daily.    . Multiple Vitamin (MULTIVITAMIN) capsule Take 1 capsule by mouth daily. Reported on 03/29/2015    . rosuvastatin (CRESTOR) 20 MG tablet TAKE (1) TABLET BY MOUTH AT BEDTIME. 30 tablet 6  . Vitamin D, Ergocalciferol, 2000 units CAPS Take by mouth daily.      No current facility-administered medications for this visit.    Allergies as of 01/23/2020 - Review Complete 12/06/2018  Allergen Reaction Noted  . Albuterol  04/14/2012  . Bactrim [sulfamethoxazole-trimethoprim]  04/14/2012  . Vytorin [ezetimibe-simvastatin]  03/25/2013    Past Medical History:  Diagnosis Date  . Adrenal insufficiency ([facility])   . Anxiety   . Aortic insufficiency    Moderate 2015  . Arthritis   . Back pain   . CAD (coronary artery disease)    Moderate ostial RCA disease at cardiac catheterization 2006  . Constipation   . Depression   . Fibroids   . Hypercholesteremia   . Rectocele     Past Surgical History:  Procedure Laterality  Date  . CARDIAC CATHETERIZATION  03/08/2004   mild progressive CAD  . carotid duplex  03/09/2007   rt subclavian 0 to 49%,right bulb 0 to 49 %,lf ICA normal  . COLONOSCOPY N/A 06/16/2013   Dr. Jena Gauss: hyperplastic polyps, melanosis coli   . DOPPLER ECHOCARDIOGRAPHY  03/20/2010   EF >55 %,trace mitral regurg,mild tricuspid regurg,aortic valve mildly sclerotic'mild to moderate aortic regurg,trace pulmonic valver regurg  . NM MYOCAR PERF WALL MOTION  03/19/06   EF 61%,low risk study    Family History  Problem Relation Age of Onset  . Cancer Mother 42       Breast then bones and all over  . Coronary artery disease Father   . Heart disease Father   . Stroke Father   . Hypertension Sister   . Breast cancer Sister 3  . Heart disease Sister   . Stroke Brother   . Coronary artery disease Brother   . Coronary artery disease Brother   . Coronary artery disease Brother   . Colon cancer Neg Hx     Social History   Socioeconomic History  . Marital status: Divorced    Spouse name: Not on file  . Number of children: Not on file  . Years of education: Not on file  .  Highest education level: Not on file  Occupational History  . Occupation: disability    Employer: UNEMPLOYED  Tobacco Use  . Smoking status: Former Smoker    Packs/day: 0.25    Years: 41.00    Pack years: 10.25    Types: Cigarettes  . Smokeless tobacco: Never Used  Vaping Use  . Vaping Use: Never used  Substance and Sexual Activity  . Alcohol use: No  . Drug use: No  . Sexual activity: Not Currently    Birth control/protection: None, Post-menopausal    Comment: once in 9 months  Other Topics Concern  . Not on file  Social History Narrative   Drinks 3 cups of coffee daily.   Social Determinants of Health   Financial Resource Strain: Not on file  Food Insecurity: Not on file  Transportation Needs: Not on file  Physical Activity: Not on file  Stress: Not on file  Social Connections: Not on file  Intimate  Partner Violence: Not on file      ROS:  General: Negative for anorexia, weight loss, fever, chills, fatigue, weakness. Eyes: Negative for vision changes.  ENT: Negative for hoarseness, difficulty swallowing , nasal congestion. CV: Negative for chest pain, angina, palpitations, dyspnea on exertion, peripheral edema.  Respiratory: Negative for dyspnea at rest, dyspnea on exertion, cough, sputum, wheezing.  GI: See history of present illness. GU:  Negative for dysuria, hematuria, urinary incontinence, urinary frequency, nocturnal urination.  MS: Negative for joint pain, low back pain.  Derm: Negative for rash or itching.  Neuro: Negative for weakness, abnormal sensation, seizure, frequent headaches, memory loss, confusion.  Psych: Negative for anxiety, depression, suicidal ideation, hallucinations.  Endo: Negative for unusual weight change.  Heme: Negative for bruising or bleeding. Allergy: Negative for rash or hives.    Physical Examination:  There were no vitals taken for this visit.   General: Well-nourished, well-developed in no acute distress.  Head: Normocephalic, atraumatic.   Eyes: Conjunctiva pink, no icterus. Mouth: Oropharyngeal mucosa moist and pink , no lesions erythema or exudate. Neck: Supple without thyromegaly, masses, or lymphadenopathy.  Lungs: Clear to auscultation bilaterally.  Heart: Regular rate and rhythm, no murmurs rubs or gallops.  Abdomen: Bowel sounds are normal, nontender, nondistended, no hepatosplenomegaly or masses, no abdominal bruits or    hernia , no rebound or guarding.   Rectal: *** Extremities: No lower extremity edema. No clubbing or deformities.  Neuro: Alert and oriented x 4 , grossly normal neurologically.  Skin: Warm and dry, no rash or jaundice.   Psych: Alert and cooperative, normal mood and affect.  Labs: ***  Imaging Studies: MM 3D SCREEN BREAST BILATERAL  Result Date: 01/11/2020 CLINICAL DATA:  Screening. EXAM: DIGITAL  SCREENING BILATERAL MAMMOGRAM WITH TOMO AND CAD COMPARISON:  Previous exam(s). ACR Breast Density Category b: There are scattered areas of fibroglandular density. FINDINGS: There are no findings suspicious for malignancy. Images were processed with CAD. IMPRESSION: No mammographic evidence of malignancy. A result letter of this screening mammogram will be mailed directly to the patient. RECOMMENDATION: Screening mammogram in one year. (Code:SM-B-01Y) BI-RADS CATEGORY  1: Negative. Electronically Signed   By: Marin Olp M.D.   On: 01/11/2020 09:49

## 2020-01-23 ENCOUNTER — Encounter: Payer: Self-pay | Admitting: Internal Medicine

## 2020-01-23 ENCOUNTER — Ambulatory Visit: Payer: Medicaid Other | Admitting: Gastroenterology

## 2020-02-22 DIAGNOSIS — E271 Primary adrenocortical insufficiency: Secondary | ICD-10-CM | POA: Diagnosis not present

## 2020-02-24 ENCOUNTER — Other Ambulatory Visit: Payer: Self-pay | Admitting: Cardiology

## 2020-04-23 ENCOUNTER — Other Ambulatory Visit: Payer: Self-pay | Admitting: Cardiology

## 2020-04-23 ENCOUNTER — Other Ambulatory Visit: Payer: Self-pay | Admitting: Gastroenterology

## 2020-05-11 ENCOUNTER — Ambulatory Visit: Payer: Medicare Other | Admitting: Cardiology

## 2020-05-14 ENCOUNTER — Encounter: Payer: Self-pay | Admitting: Physician Assistant

## 2020-05-14 ENCOUNTER — Other Ambulatory Visit: Payer: Self-pay

## 2020-05-14 ENCOUNTER — Ambulatory Visit (INDEPENDENT_AMBULATORY_CARE_PROVIDER_SITE_OTHER): Payer: Medicare Other | Admitting: Physician Assistant

## 2020-05-14 VITALS — BP 134/86 | HR 97 | Ht 66.0 in | Wt 221.6 lb

## 2020-05-14 DIAGNOSIS — Z720 Tobacco use: Secondary | ICD-10-CM

## 2020-05-14 DIAGNOSIS — I351 Nonrheumatic aortic (valve) insufficiency: Secondary | ICD-10-CM | POA: Diagnosis not present

## 2020-05-14 DIAGNOSIS — I2510 Atherosclerotic heart disease of native coronary artery without angina pectoris: Secondary | ICD-10-CM

## 2020-05-14 DIAGNOSIS — E782 Mixed hyperlipidemia: Secondary | ICD-10-CM

## 2020-05-14 DIAGNOSIS — R600 Localized edema: Secondary | ICD-10-CM

## 2020-05-14 NOTE — Progress Notes (Signed)
Cardiology Office Note    Date:  05/14/2020   ID:  Debra Graves, DOB 01-26-1955, MRN [id number]   PCP:  [facility], [place]  Cardiologist:  Rozann Lesches, MD  Advanced Practice Provider:  No care team member to display Electrophysiologist:  None   [phone]   No chief complaint on file.   History of Present Illness:  Debra Graves is a 65 y.o. female with history of CAD with 49 to 70% ostial RCA and 30% LAD on cath in 2006, moderate aortic regurgitation on echo 11/2018, hyperlipidemia.  Patient last saw Dr. Domenic Polite 11/2018 at which time she was having intermittent dyspnea on exertion and he ordered a follow-up echo that was unchanged.  Patient comes in for f/u. Was put on lasix prn for leg swelling in Jan. Uses it about twice a week. Eats fast food. Hasn't been exercising because of back problems. Was told to walk 15 min twice a day. Smoking 1/2 ppd. Wants to quit. Denies chest pain, dizziness or presyncope. Occasional DOE if carrying groceries or walking but hasn't gotten worse.Diagnosed with primary adrenaocortical insufficiency and takes cortef bid. Cut herself on rusting furniture yest and is going to call health department for a tetanus shot.  Past Medical History:  Diagnosis Date  . Adrenal insufficiency ([place])   . Anxiety   . Aortic insufficiency    Moderate 2015  . Arthritis   . Back pain   . CAD (coronary artery disease)    Moderate ostial RCA disease at cardiac catheterization 2006  . Constipation   . Depression   . Fibroids   . Hypercholesteremia   . Rectocele     Past Surgical History:  Procedure Laterality Date  . CARDIAC CATHETERIZATION  03/08/2004   mild progressive CAD  . carotid duplex  03/09/2007   rt subclavian 0 to 49%,right bulb 0 to 49 %,lf ICA normal  . COLONOSCOPY N/A 06/16/2013   Dr. Gala Romney: hyperplastic polyps, melanosis coli   . DOPPLER ECHOCARDIOGRAPHY  03/20/2010   EF >55 %,trace mitral  regurg,mild tricuspid regurg,aortic valve mildly sclerotic'mild to moderate aortic regurg,trace pulmonic valver regurg  . NM MYOCAR PERF WALL MOTION  03/19/06   EF 61%,low risk study    Current Medications: Current Meds  Medication Sig  . aspirin 81 MG tablet Take 81 mg by mouth daily.  . clonazePAM (KLONOPIN) 0.5 MG tablet Take 0.5 mg by mouth daily.  [name] Calcium (STOOL SOFTENER PO) Take 2 tablets by mouth daily.  Marland Kitchen escitalopram (LEXAPRO) 20 MG tablet Take 20 mg by mouth daily.  Marland Kitchen esomeprazole (NEXIUM) 40 MG capsule TAKE 1 CAPSULE BY MOUTH DAILY BEFORE BREAKFAST.  . fish oil-omega-3 fatty acids 1000 MG capsule Take 2 g by mouth daily.  . furosemide (LASIX) 20 MG tablet Take 20 mg by mouth 2 (two) times daily.  . hydrocortisone (CORTEF) 10 MG tablet Take 10 mg by mouth 2 (two) times daily.  Marland Kitchen LORazepam (ATIVAN) 1 MG tablet Take 1 mg by mouth every 6 (six) hours as needed for anxiety.  . meloxicam (MOBIC) 7.5 MG tablet Take 7.5 mg by mouth 2 (two) times daily.  . Multiple Vitamin (MULTIVITAMIN) capsule Take 1 capsule by mouth daily. Reported on 03/29/2015  . rosuvastatin (CRESTOR) 20 MG tablet TAKE (1) TABLET BY MOUTH AT BEDTIME.  Marland Kitchen Vitamin D, Ergocalciferol, 2000 units CAPS Take by mouth daily.      Allergies:   Albuterol, Bactrim [sulfamethoxazole-trimethoprim], and Vytorin [  ezetimibe-simvastatin]   Social History   Socioeconomic History  . Marital status: Divorced    Spouse name: Not on file  . Number of children: Not on file  . Years of education: Not on file  . Highest education level: Not on file  Occupational History  . Occupation: disability    Employer: UNEMPLOYED  Tobacco Use  . Smoking status: Former Smoker    Packs/day: 0.50    Years: 41.00    Pack years: 20.50    Types: Cigarettes  . Smokeless tobacco: Never Used  Vaping Use  . Vaping Use: Never used  Substance and Sexual Activity  . Alcohol use: No  . Drug use: No  . Sexual activity: Not Currently     Birth control/protection: None, Post-menopausal    Comment: once in 9 months  Other Topics Concern  . Not on file  Social History Narrative   Drinks 3 cups of coffee daily.   Social Determinants of Health   Financial Resource Strain: Not on file  Food Insecurity: Not on file  Transportation Needs: Not on file  Physical Activity: Not on file  Stress: Not on file  Social Connections: Not on file     Family History:  The patient's family history includes Breast cancer (age of onset: 15) in her sister; Cancer (age of onset: 32) in her mother; Coronary artery disease in her brother, brother, brother, and father; Heart disease in her father and sister; Hypertension in her sister; Stroke in her brother and father.   ROS:   Please see the history of present illness.    ROS All other systems reviewed and are negative.   PHYSICAL EXAM:   VS:  BP 134/86   Pulse 97   Ht 5' 6"  (1.676 m)   Wt 221 lb 9.6 oz (100.5 kg)   SpO2 95%   BMI 35.77 kg/m   Physical Exam  GEN: Obese, in no acute distress  Neck: no JVD, carotid bruits, or masses Cardiac:RRR;  1/6 systolic murmur LSB Respiratory:  clear to auscultation bilaterally, normal work of breathing GI: soft, nontender, nondistended, + BS Ext: without cyanosis, clubbing, or edema, Good distal pulses bilaterally Neuro:  Alert and Oriented x 3 Psych: euthymic mood, full affect  Wt Readings from Last 3 Encounters:  05/14/20 221 lb 9.6 oz (100.5 kg)  12/06/18 211 lb (95.7 kg)  08/19/17 201 lb 6.4 oz (91.4 kg)      Studies/Labs Reviewed:   EKG:  EKG is ordered today.  The ekg ordered today demonstrates NSR normal EKG  Recent Labs: No results found for requested labs within last 8760 hours.   Lipid Panel    Component Value Date/Time   CHOL 166 04/09/2015 0957   TRIG 79 04/09/2015 0957   HDL 76 04/09/2015 0957   CHOLHDL 2.6 03/20/2014 0851   VLDL 23 03/20/2014 0851   LDLCALC 74 04/09/2015 0957    Additional studies/ records  that were reviewed today include:  Echo 12/13/18 IMPRESSIONS     1. Left ventricular ejection fraction, by visual estimation, is 55 to  60%. The left ventricle has normal function. There is mildly increased  left ventricular hypertrophy.   2. Left ventricular diastolic parameters are consistent with Grade I  diastolic dysfunction (impaired relaxation).   3. Global right ventricle has normal systolic function.The right  ventricular size is normal. No increase in right ventricular wall  thickness.   4. Left atrial size was normal.   5. Right atrial size was  normal.   6. Presence of pericardial fat pad.   7. Mild aortic valve annular calcification.   8. The mitral valve is grossly normal. Trace mitral valve regurgitation.   9. The tricuspid valve is grossly normal. Tricuspid valve regurgitation  is trivial.  10. Aortic regurgitation PHT measures 497 msec.  11. Aortic valve regurgitation is moderate.  12. The aortic valve is tricuspid. Aortic valve regurgitation is moderate.  13. The pulmonic valve was grossly normal. Pulmonic valve regurgitation is  trivial.  14. TR signal is inadequate for assessing pulmonary artery systolic  pressure.  15. The inferior vena cava is normal in size with greater than 50%  respiratory variability, suggesting right atrial pressure of 3 mmHg.  Echocardiogram 07/28/2017: Study Conclusions   - Left ventricle: The cavity size was normal. Wall thickness was   increased in a pattern of mild LVH. Systolic function was normal.   The estimated ejection fraction was in the range of 55% to 60%.   Wall motion was normal; there were no regional wall motion   abnormalities. Doppler parameters are consistent with abnormal   left ventricular relaxation (grade 1 diastolic dysfunction). - Aortic valve: There was moderate regurgitation. Valve area (VTI):   2.01 cm^2. Valve area (Vmax): 2.11 cm^2. Valve area (Vmean): 2.24   cm^2. - Technically adequate study.  Cardiac  catheterization 2006 with 60 to 70% ostial RCA 30% LAD did not correlate with anterior ischemia on stress test.  Has been treated medically  Risk Assessment/Calculations:         ASSESSMENT:    1. Coronary artery disease involving native coronary artery of native heart without angina pectoris   2. Aortic valve insufficiency, etiology of cardiac valve disease unspecified   3. Mixed hyperlipidemia   4. Leg edema   5. Tobacco abuse      PLAN:  In order of problems listed above:  CAD cardiac cath 2006 to 70% ostial RCA 30% LAD did not correlate with abnormal stress test treated medically-no angina. On ASA and crestor/fish oil Recommend smoking cessation, 20-30 min exercise daily  Moderate aortic regurgitation on echo 11/2018 stable recheck echo  Hyperlipidemia on Crestor managed by Dr. Madelyn Brunner request labs  Leg swelling on prn lasix- 2 mg sodium diet discussed. No edema today.  Tobacco abuse-smoking cessation discussed.   Shared Decision Making/Informed Consent        Medication Adjustments/Labs and Tests Ordered: Current medicines are reviewed at length with the patient today.  Concerns regarding medicines are outlined above.  Medication changes, Labs and Tests ordered today are listed in the Patient Instructions below. Patient Instructions   Medication Instructions:  Your physician recommends that you continue on your current medications as directed. Please refer to the Current Medication list given to you today.  *If you need a refill on your cardiac medications before your next appointment, please call your pharmacy*   Lab Work: Lab work has been requested from you primary care provider  If you have labs (blood work) drawn today and your tests are completely normal, you will receive your results only by: Marland Kitchen MyChart Message (if you have MyChart) OR . A paper copy in the mail If you have any lab test that is abnormal or we need to change your treatment, we will call  you to review the results.   Testing/Procedures: Your physician has requested that you have an echocardiogram. Echocardiography is a painless test that uses sound waves to create images of your heart. It provides your  doctor with information about the size and shape of your heart and how well your heart's chambers and valves are working. This procedure takes approximately one hour. There are no restrictions for this procedure.     Follow-Up: At [facility], you and your health needs are our priority.  As part of our continuing mission to provide you with exceptional heart care, we have created designated Provider Care Teams.  These Care Teams include your primary Cardiologist (physician) and Advanced Practice Providers (APPs -  Physician Assistants and Nurse Practitioners) who all work together to provide you with the care you need, when you need it.  We recommend signing up for the patient portal called "MyChart".  Sign up information is provided on this After Visit Summary.  MyChart is used to connect with patients for Virtual Visits (Telemedicine).  Patients are able to view lab/test results, encounter notes, upcoming appointments, etc.  Non-urgent messages can be sent to your provider as well.   To learn more about what you can do with MyChart, go to [email or web address].    Your next appointment:   12 month(s)  The format for your next appointment:   In Person  Provider:   Rozann Lesches, MD   Other Instructions  Two Gram Sodium Diet 2000 mg  What is Sodium? Sodium is a mineral found naturally in many foods. The most significant source of sodium in the diet is table salt, which is about 40% sodium.  Processed, convenience, and preserved foods also contain a large amount of sodium.  The body needs only 500 mg of sodium daily to function,  A normal diet provides more than enough sodium even if you do not use salt.  Why Limit Sodium? A build up of sodium in the body can cause  thirst, increased blood pressure, shortness of breath, and water retention.  Decreasing sodium in the diet can reduce edema and risk of heart attack or stroke associated with high blood pressure.  Keep in mind that there are many other factors involved in these health problems.  Heredity, obesity, lack of exercise, cigarette smoking, stress and what you eat all play a role.  General Guidelines:  Do not add salt at the table or in cooking.  One teaspoon of salt contains over 2 grams of sodium.  Read food labels  Avoid processed and convenience foods  Ask your dietitian before eating any foods not dicussed in the menu planning guidelines  Consult your physician if you wish to use a salt substitute or a sodium containing medication such as antacids.  Limit milk and milk products to 16 oz (2 cups) per day.  Shopping Hints:  READ LABELS!! "Dietetic" does not necessarily mean low sodium.  Salt and other sodium ingredients are often added to foods during processing.   Menu Planning Guidelines Food Group Choose More Often Avoid  Beverages (see also the milk group All fruit juices, low-sodium, salt-free vegetables juices, low-sodium carbonated beverages Regular vegetable or tomato juices, commercially softened water used for drinking or cooking  Breads and Cereals Enriched white, wheat, rye and pumpernickel bread, hard rolls and dinner rolls; muffins, cornbread and waffles; most dry cereals, cooked cereal without added salt; unsalted crackers and breadsticks; low sodium or homemade bread crumbs Bread, rolls and crackers with salted tops; quick breads; instant hot cereals; pancakes; commercial bread stuffing; self-rising flower and biscuit mixes; regular bread crumbs or cracker crumbs  Desserts and Sweets Desserts and sweets mad with mild should be within allowance Instant pudding  mixes and cake mixes  Fats Butter or margarine; vegetable oils; unsalted salad dressings, regular salad dressings limited  to 1 Tbs; light, sour and heavy cream Regular salad dressings containing bacon fat, bacon bits, and salt pork; snack dips made with instant soup mixes or processed cheese; salted nuts  Fruits Most fresh, frozen and canned fruits Fruits processed with salt or sodium-containing ingredient (some dried fruits are processed with sodium sulfites        Vegetables Fresh, frozen vegetables and low- sodium canned vegetables Regular canned vegetables, sauerkraut, pickled vegetables, and others prepared in brine; frozen vegetables in sauces; vegetables seasoned with ham, bacon or salt pork  Condiments, Sauces, Miscellaneous  Salt substitute with physician's approval; pepper, herbs, spices; vinegar, lemon or lime juice; hot pepper sauce; garlic powder, onion powder, low sodium soy sauce (1 Tbs.); low sodium condiments (ketchup, chili sauce, mustard) in limited amounts (1 tsp.) fresh ground horseradish; unsalted tortilla chips, pretzels, potato chips, popcorn, salsa (1/4 cup) Any seasoning made with salt including garlic salt, celery salt, onion salt, and seasoned salt; sea salt, rock salt, kosher salt; meat tenderizers; monosodium glutamate; mustard, regular soy sauce, barbecue, sauce, chili sauce, teriyaki sauce, steak sauce, Worcestershire sauce, and most flavored vinegars; canned gravy and mixes; regular condiments; salted snack foods, olives, picles, relish, horseradish sauce, catsup   Food preparation: Try these seasonings Meats:    Pork Sage, onion Serve with applesauce  Chicken Poultry seasoning, thyme, parsley Serve with cranberry sauce  Lamb Curry powder, rosemary, garlic, thyme Serve with mint sauce or jelly  Veal Marjoram, basil Serve with current jelly, cranberry sauce  Beef Pepper, bay leaf Serve with dry mustard, unsalted chive butter  Fish [place] leaf, dill Serve with unsalted lemon butter, unsalted parsley butter  Vegetables:    Asparagus Lemon juice   Broccoli Lemon juice   Carrots Mustard  dressing parsley, mint, nutmeg, glazed with unsalted butter and sugar   Green beans Marjoram, lemon juice, nutmeg,dill seed   Tomatoes Basil, marjoram, onion   Spice /blend for [employer]" 4 tsp ground thyme 1 tsp ground sage 3 tsp ground rosemary 4 tsp ground marjoram   Test your knowledge 1. A product that says "Salt Free" may still contain sodium. True or False 2. Garlic Powder and Hot Pepper Sauce an be used as alternative seasonings.True or False 3. Processed foods have more sodium than fresh foods.  True or False 4. Canned Vegetables have less sodium than froze True or False  WAYS TO DECREASE YOUR SODIUM INTAKE 1. Avoid the use of added salt in cooking and at the table.  Table salt (and other prepared seasonings which contain salt) is probably one of the greatest sources of sodium in the diet.  Unsalted foods can gain flavor from the sweet, sour, and butter taste sensations of herbs and spices.  Instead of using salt for seasoning, try the following seasonings with the foods listed.  Remember: how you use them to enhance natural food flavors is limited only by your creativity... Allspice-Meat, fish, eggs, fruit, peas, red and yellow vegetables Almond Extract-Fruit baked goods Anise Seed-Sweet breads, fruit, carrots, beets, cottage cheese, cookies (tastes like licorice) Basil-Meat, fish, eggs, vegetables, rice, vegetables salads, soups, sauces Bay Leaf-Meat, fish, stews, poultry Burnet-Salad, vegetables (cucumber-like flavor) Caraway Seed-Bread, cookies, cottage cheese, meat, vegetables, cheese, rice Cardamon-Baked goods, fruit, soups Celery Powder or seed-Salads, salad dressings, sauces, meatloaf, soup, bread.Do not use  celery salt Chervil-Meats, salads, fish, eggs, vegetables, cottage cheese (parsley-like flavor) Chili Power-Meatloaf, chicken  cheese, corn, eggplant, egg dishes Chives-Salads cottage cheese, egg dishes, soups, vegetables, sauces Cilantro-Salsa,  casseroles Cinnamon-Baked goods, fruit, pork, lamb, chicken, carrots Cloves-Fruit, baked goods, fish, pot roast, green beans, beets, carrots Coriander-Pastry, cookies, meat, salads, cheese (lemon-orange flavor) Cumin-Meatloaf, fish,cheese, eggs, cabbage,fruit pie (caraway flavor) [employer], fruit, eggs, fish, poultry, cottage cheese, vegetables Dill Seed-Meat, cottage cheese, poultry, vegetables, fish, salads, bread Fennel Seed-Bread, cookies, apples, pork, eggs, fish, beets, cabbage, cheese, Licorice-like flavor Garlic-(buds or powder) Salads, meat, poultry, fish, bread, butter, vegetables, potatoes.Do not  use garlic salt Ginger-Fruit, vegetables, baked goods, meat, fish, poultry Horseradish Root-Meet, vegetables, butter Lemon Juice or Extract-Vegetables, fruit, tea, baked goods, fish salads Mace-Baked goods fruit, vegetables, fish, poultry (taste like nutmeg) Maple Extract-Syrups Marjoram-Meat, chicken, fish, vegetables, breads, green salads (taste like Sage) Mint-Tea, lamb, sherbet, vegetables, desserts, carrots, cabbage Mustard, Dry or Seed-Cheese, eggs, meats, vegetables, poultry Nutmeg-Baked goods, fruit, chicken, eggs, vegetables, desserts Onion Powder-Meat, fish, poultry, vegetables, cheese, eggs, bread, rice salads (Do not use   Onion salt) Orange Extract-Desserts, baked goods Oregano-Pasta, eggs, cheese, onions, pork, lamb, fish, chicken, vegetables, green salads Paprika-Meat, fish, poultry, eggs, cheese, vegetables Parsley Flakes-Butter, vegetables, meat fish, poultry, eggs, bread, salads (certain forms may   Contain sodium Pepper-Meat fish, poultry, vegetables, eggs Peppermint Extract-Desserts, baked goods Poppy Seed-Eggs, bread, cheese, fruit dressings, baked goods, noodles, vegetables, cottage  [employer], poultry, meat, fish, cauliflower, turnips,eggs bread Saffron-Rice, bread, veal, chicken, fish, eggs Sage-Meat, fish,  poultry, onions, eggplant, tomateos, pork, stews Savory-Eggs, salads, poultry, meat, rice, vegetables, soups, pork Tarragon-Meat, poultry, fish, eggs, butter, vegetables (licorice-like flavor)  Thyme-Meat, poultry, fish, eggs, vegetables, (clover-like flavor), sauces, soups Tumeric-Salads, butter, eggs, fish, rice, vegetables (saffron-like flavor) Vanilla Extract-Baked goods, candy Vinegar-Salads, vegetables, meat marinades Walnut Extract-baked goods, candy  2. Choose your Foods Wisely   The following is a list of foods to avoid which are high in sodium:  Meats-Avoid all smoked, canned, salt cured, dried and kosher meat and fish as well as Anchovies   [employer] meats:Bologna, Liverwurst, Pastrami Canned meat or fish  Marinated herring Caviar    Pepperoni Corned Beef   Pizza Dried chipped beef  Salami Frozen breaded fish or meat Salt pork Frankfurters or hot dogs  Sardines Gefilte fish   Sausage Ham (boiled ham, Proscuitto Smoked butt    spiced ham)   Spam      TV Dinners Vegetables Canned vegetables (Regular) Relish Canned mushrooms  Sauerkraut Olives    Tomato juice Pickles  Bakery and Dessert Products Canned puddings  Cream pies Cheesecake   Decorated cakes Cookies  Beverages/Juices Tomato juice, regular  Gatorade   V-8 vegetable juice, regular  Breads and Cereals Biscuit mixes   Salted potato chips, corn chips, pretzels Bread stuffing mixes  Salted crackers and rolls Pancake and waffle mixes Self-rising flour  Seasonings Accent    Meat sauces Barbecue sauce  Meat tenderizer Catsup    Monosodium glutamate (MSG) Celery salt   Onion salt Chili sauce   Prepared mustard Garlic salt   Salt, seasoned salt, sea salt Gravy mixes   Soy sauce Horseradish   Steak sauce Ketchup   Tartar sauce Lite salt    Teriyaki sauce Marinade mixes   Worcestershire sauce  Others Baking powder   Cocoa and cocoa mixes Baking soda   Commercial casserole  mixes Candy-caramels, chocolate  Dehydrated soups    Bars, fudge,nougats  Instant rice and pasta mixes Canned broth or soup  Maraschino cherries Cheese, aged  and processed cheese and cheese spreads  Learning Assessment Quiz  Indicated T (for True) or F (for False) for each of the following statements:  1. _____ Fresh fruits and vegetables and unprocessed grains are generally low in sodium 2. _____ Water may contain a considerable amount of sodium, depending on the source 3. _____ You can always tell if a food is high in sodium by tasting it 4. _____ Certain laxatives my be high in sodium and should be avoided unless prescribed   by a physician or pharmacist 5. _____ Salt substitutes may be used freely by anyone on a sodium restricted diet 6. _____ Sodium is present in table salt, food additives and as a natural component of   most foods 7. _____ Table salt is approximately 90% sodium 8. _____ Limiting sodium intake may help prevent excess fluid accumulation in the body 9. _____ On a sodium-restricted diet, seasonings such as bouillon soy sauce, and    cooking wine should be used in place of table salt 10. _____ On an ingredient list, a product which lists monosodium glutamate as the first   ingredient is an appropriate food to include on a low sodium diet  Circle the best answer(s) to the following statements (Hint: there may be more than one correct answer)  11. On a low-sodium diet, some acceptable snack items are:    A. Olives  F. Bean dip   K. Grapefruit juice    B. Salted Pretzels G. Commercial Popcorn   L. Canned peaches    C. Carrot Sticks  H. Bouillon   M. Unsalted nuts   D. [place] fries  I. Peanut butter crackers N. Salami   E. Sweet pickles J. Tomato Juice   O. Pizza  12.  Seasonings that may be used freely on a reduced - sodium diet include   A. Lemon wedges F.Monosodium glutamate K. Celery seed    B.Soysauce   G. Pepper   L. Mustard powder   C. Sea salt  H.  Cooking wine  M. Onion flakes   D. Vinegar  E. Prepared horseradish N. Salsa   E. Sage   J. Worcestershire sauce  O. Chutney    Steps to Quit Smoking Smoking tobacco is the leading cause of preventable death. It can affect almost every organ in the body. Smoking puts you and those around you at risk for developing many serious chronic diseases. Quitting smoking can be difficult, but it is one of the best things that you can do for your health. It is never too late to quit. How do I get ready to quit? When you decide to quit smoking, create a plan to help you succeed. Before you quit:  Pick a date to quit. Set a date within the next 2 weeks to give you time to prepare.  Write down the reasons why you are quitting. Keep this list in places where you will see it often.  Tell your family, friends, and co-workers that you are quitting. Support from your loved ones can make quitting easier.  Talk with your health care provider about your options for quitting smoking.  Find out what treatment options are covered by your health insurance.  Identify people, places, things, and activities that make you want to smoke (triggers). Avoid them. What first steps can I take to quit smoking?  Throw away all cigarettes at home, at work, and in your car.  Throw away smoking accessories, such as Scientist, research (medical).  Clean your  car. Make sure to empty the ashtray.  Clean your home, including curtains and carpets. What strategies can I use to quit smoking? Talk with your health care provider about combining strategies, such as taking medicines while you are also receiving in-person counseling. Using these two strategies together makes you more likely to succeed in quitting than if you used either strategy on its own.  If you are pregnant or breastfeeding, talk with your health care provider about finding counseling or other support strategies to quit smoking. Do not take medicine to help you quit  smoking unless your health care provider tells you to do so. To quit smoking: Quit right away  Quit smoking completely, instead of gradually reducing how much you smoke over a period of time. Research shows that stopping smoking right away is more successful than gradually quitting.  Attend in-person counseling to help you build problem-solving skills. You are more likely to succeed in quitting if you attend counseling sessions regularly. Even short sessions of 10 minutes can be effective. Take medicine You may take medicines to help you quit smoking. Some medicines require a prescription and some you can purchase over-the-counter. Medicines may have nicotine in them to replace the nicotine in cigarettes. Medicines may:  Help to stop cravings.  Help to relieve withdrawal symptoms. Your health care provider may recommend:  Nicotine patches, gum, or lozenges.  Nicotine inhalers or sprays.  Non-nicotine medicine that is taken by mouth. Find resources Find resources and support systems that can help you to quit smoking and remain smoke-free after you quit. These resources are most helpful when you use them often. They include:  Online chats with a Social worker.  Telephone quitlines.  Printed Furniture conservator/restorer.  Support groups or group counseling.  Text messaging programs.  Mobile phone apps or applications. Use apps that can help you stick to your quit plan by providing reminders, tips, and encouragement. There are many free apps for mobile devices as well as websites. Examples include Quit Guide from the [employer] and smokefree.gov   What things can I do to make it easier to quit?  Reach out to your family and friends for support and encouragement. Call telephone quitlines (1-800-QUIT-NOW), reach out to support groups, or work with a counselor for support.  Ask people who smoke to avoid smoking around you.  Avoid places that trigger you to smoke, such as bars, parties, or smoke-break  areas at work.  Spend time with people who do not smoke.  Lessen the stress in your life. Stress can be a smoking trigger for some people. To lessen stress, try: ? Exercising regularly. ? Doing deep-breathing exercises. ? Doing yoga. ? Meditating. ? Performing a body scan. This involves closing your eyes, scanning your body from head to toe, and noticing which parts of your body are particularly tense. Try to relax the muscles in those areas.   How will I feel when I quit smoking? Day 1 to 3 weeks Within the first 24 hours of quitting smoking, you may start to feel withdrawal symptoms. These symptoms are usually most noticeable 2-3 days after quitting, but they usually do not last for more than 2-3 weeks. You may experience these symptoms:  Mood swings.  Restlessness, anxiety, or irritability.  Trouble concentrating.  Dizziness.  Strong cravings for sugary foods and nicotine.  Mild weight gain.  Constipation.  Nausea.  Coughing or a sore throat.  Changes in how the medicines that you take for unrelated issues work in your body.  Depression.  Trouble sleeping (insomnia). Week 3 and afterward After the first 2-3 weeks of quitting, you may start to notice more positive results, such as:  Improved sense of smell and taste.  Decreased coughing and sore throat.  Slower heart rate.  Lower blood pressure.  Clearer skin.  The ability to breathe more easily.  Fewer sick days. Quitting smoking can be very challenging. Do not get discouraged if you are not successful the first time. Some people need to make many attempts to quit before they achieve long-term success. Do your best to stick to your quit plan, and talk with your health care provider if you have any questions or concerns. Summary  Smoking tobacco is the leading cause of preventable death. Quitting smoking is one of the best things that you can do for your health.  When you decide to quit smoking, create a  plan to help you succeed.  Quit smoking right away, not slowly over a period of time.  When you start quitting, seek help from your health care provider, family, or friends. This information is not intended to replace advice given to you by your health care provider. Make sure you discuss any questions you have with your health care provider. Document Revised: 10/01/2018 Document Reviewed: 03/27/2018 Elsevier Patient Education  [place].   Exercise Information for Aging Adults Staying physically active is important as you age. The four types of exercises that are best for older adults are endurance, strength, balance, and flexibility. Contact your health care provider before you start any exercise routine. Ask your health care provider what activities are safe for you. What are the risks? Risks associated with exercising include:  Overdoing it. This may lead to sore muscles or fatigue.  Falls.  Injuries.  Dehydration. How to do these exercises Endurance exercises Endurance (aerobic) exercises raise your breathing rate and heart rate. Increasing your endurance helps you to do everyday tasks and stay healthy. By improving the health of your body system that includes your heart, lungs, and blood vessels (circulatory system), you may also delay or prevent diseases such as heart disease, diabetes, and bone loss (osteoporosis). Types of endurance exercises include:  Sports.  Indoor activities, such as using gym equipment, doing water aerobics, or dancing.  Outdoor activities, such as biking or jogging.  Tasks around the house, such as gardening, yard work, and heavy household chores like cleaning.  Walking, such as hiking or walking around your neighborhood. When doing endurance exercises, make sure you:  Are aware of your surroundings.  Use safety equipment as directed.  Dress in layers when exercising outdoors.  Drink plenty of water to stay well hydrated. Build up  endurance slowly. Start with 10 minutes at a time, and gradually build up to doing 30 minutes at a time. Unless your health care provider gave you different instructions, aim to exercise for a total of 150 minutes a week. Spread out that time so you are working on endurance on 3 or more days a week.   Strength exercises Lifting, pulling, or pushing weights helps to strengthen muscles. Having stronger muscles makes it easier to do everyday activities, such as getting up from a chair, climbing stairs, carrying groceries, and playing with grandchildren. Strength exercises include arm and leg exercises that may be done:  With weights.  Without weights (using your own body weight).  With a resistance band. When doing strength exercises:  Move smoothly and steadily. Do not suddenly thrust or jerk the weights,  the resistance band, or your body.  Start with no weights or with light weights, and gradually add more weight over time. Eventually, aim to use weights that are hard or very hard for you to lift. This means that you are able to do 8 repetitions with the weight, and the last few repetitions are very challenging.  Lift or push weights into position for 3 seconds, hold the position for 1 second, and then take 3 seconds to return to your starting position.  Breathe out (exhale) during difficult movements, like lifting or pushing weights. Breathe in (inhale) to relax your muscles before the next repetition.  Consider alternating arms or legs, especially when you first start strength exercises.  Expect some slight muscle soreness after each session. Do strength exercises on 2 or more days a week, for 30 minutes at a time. Avoid exercising the same muscle groups two days in a row. For example, if you work on your leg muscles one day, work on your arm muscles the next day. When you can do two sets of 10-15 repetitions with a certain weight, increase the amount of weight.   Balance Balance exercises  can help to prevent falls. Balance exercises include:  Standing on one foot.  Heel-to-toe walk.  Balance walk.  Tai chi. Make sure you have something sturdy to hold onto while doing balance exercises, such as a sturdy chair. As your balance improves, challenge yourself by holding onto the chair with one hand instead of two, and then with no hands. Trying exercises with your eyes closed also challenges your balance, but be sure to have a sturdy surface (like a countertop) close by in case you need it. Do balance exercises as often as you want, or as often as directed by your health care provider. Strength exercises for the lower body also help to improve balance. Flexibility Flexibility exercises improve how far you can bend, straighten, move, or rotate parts of your body (range of motion). These exercises also help you to do everyday activities such as getting dressed or reaching for objects. Flexibility exercises include stretching different parts of the body, and they may be done in a standing or seated position or on the floor. When stretching, make sure you:  Keep a slight bend in your arms and legs. Avoid completely straightening ("locking") your joints.  Do not stretch so far that you feel pain. You should feel a mild stretching feeling. You may try stretching farther as you become more flexible over time.  Relax and breathe between stretches.  Hold onto something sturdy for balance as needed. Hold each stretch for 10-30 seconds. Repeat each stretch 3-5 times.   General safety tips  Exercise in well-lit areas.  Do not hold your breath during exercises or stretches.  Warm up before exercising, and cool down after exercising. This can help prevent injury.  Drink plenty of water during exercise or any activity that makes you sweat.  Use smooth, steady movements. Do not use sudden, jerking movements, especially when lifting weights or doing flexibility exercises.  If you are not  sure if an exercise is safe for you, or you are not sure how to do an exercise, talk with your health care provider. This is especially important if you have had surgery on muscles, bones, or joints (orthopedic surgery). Where to find more information You can find more information about exercise for older adults from:  Your local health department, fitness center, or community center. These facilities may have  programs for aging adults.  [employer] on Aging: http://kim-miller.com/  National Council on Aging: www.ncoa.org Summary  Staying physically active is important as you age.  Make sure to contact your health care provider before you start any exercise routine. Ask your health care provider what activities are safe for you.  Doing endurance, strength, balance, and flexibility exercises can help to delay or prevent certain diseases, such as heart disease, diabetes, and bone loss (osteoporosis). This information is not intended to replace advice given to you by your health care provider. Make sure you discuss any questions you have with your health care provider. Document Revised: 04/27/2019 Document Reviewed: 04/27/2019 Elsevier Patient Education  2021 [place], Ermalinda Barrios, [place]  05/14/2020 11:31 AM    [facility] [place], [place], [place]  [address] Phone: [phone]; Fax: [phone]

## 2020-05-14 NOTE — Patient Instructions (Addendum)
Medication Instructions:  Your physician recommends that you continue on your current medications as directed. Please refer to the Current Medication list given to you today.  *If you need a refill on your cardiac medications before your next appointment, please call your pharmacy*   Lab Work: Lab work has been requested from you primary care provider  If you have labs (blood work) drawn today and your tests are completely normal, you will receive your results only by: Marland Kitchen MyChart Message (if you have MyChart) OR . A paper copy in the mail If you have any lab test that is abnormal or we need to change your treatment, we will call you to review the results.   Testing/Procedures: Your physician has requested that you have an echocardiogram. Echocardiography is a painless test that uses sound waves to create images of your heart. It provides your doctor with information about the size and shape of your heart and how well your heart's chambers and valves are working. This procedure takes approximately one hour. There are no restrictions for this procedure.     Follow-Up: At [facility], you and your health needs are our priority.  As part of our continuing mission to provide you with exceptional heart care, we have created designated Provider Care Teams.  These Care Teams include your primary Cardiologist (physician) and Advanced Practice Providers (APPs -  Physician Assistants and Nurse Practitioners) who all work together to provide you with the care you need, when you need it.  We recommend signing up for the patient portal called "MyChart".  Sign up information is provided on this After Visit Summary.  MyChart is used to connect with patients for Virtual Visits (Telemedicine).  Patients are able to view lab/test results, encounter notes, upcoming appointments, etc.  Non-urgent messages can be sent to your provider as well.   To learn more about what you can do with MyChart, go to  [email or web address].    Your next appointment:   12 month(s)  The format for your next appointment:   In Person  Provider:   Rozann Lesches, MD   Other Instructions 20-30 minutes of Exercise Daily   Two Gram Sodium Diet 2000 mg  What is Sodium? Sodium is a mineral found naturally in many foods. The most significant source of sodium in the diet is table salt, which is about 40% sodium.  Processed, convenience, and preserved foods also contain a large amount of sodium.  The body needs only 500 mg of sodium daily to function,  A normal diet provides more than enough sodium even if you do not use salt.  Why Limit Sodium? A build up of sodium in the body can cause thirst, increased blood pressure, shortness of breath, and water retention.  Decreasing sodium in the diet can reduce edema and risk of heart attack or stroke associated with high blood pressure.  Keep in mind that there are many other factors involved in these health problems.  Heredity, obesity, lack of exercise, cigarette smoking, stress and what you eat all play a role.  General Guidelines:  Do not add salt at the table or in cooking.  One teaspoon of salt contains over 2 grams of sodium.  Read food labels  Avoid processed and convenience foods  Ask your dietitian before eating any foods not dicussed in the menu planning guidelines  Consult your physician if you wish to use a salt substitute or a sodium containing medication such as antacids.  Limit milk and milk products  to 16 oz (2 cups) per day.  Shopping Hints:  READ LABELS!! "Dietetic" does not necessarily mean low sodium.  Salt and other sodium ingredients are often added to foods during processing.   Menu Planning Guidelines Food Group Choose More Often Avoid  Beverages (see also the milk group All fruit juices, low-sodium, salt-free vegetables juices, low-sodium carbonated beverages Regular vegetable or tomato juices, commercially softened water used  for drinking or cooking  Breads and Cereals Enriched white, wheat, rye and pumpernickel bread, hard rolls and dinner rolls; muffins, cornbread and waffles; most dry cereals, cooked cereal without added salt; unsalted crackers and breadsticks; low sodium or homemade bread crumbs Bread, rolls and crackers with salted tops; quick breads; instant hot cereals; pancakes; commercial bread stuffing; self-rising flower and biscuit mixes; regular bread crumbs or cracker crumbs  Desserts and Sweets Desserts and sweets mad with mild should be within allowance Instant pudding mixes and cake mixes  Fats Butter or margarine; vegetable oils; unsalted salad dressings, regular salad dressings limited to 1 Tbs; light, sour and heavy cream Regular salad dressings containing bacon fat, bacon bits, and salt pork; snack dips made with instant soup mixes or processed cheese; salted nuts  Fruits Most fresh, frozen and canned fruits Fruits processed with salt or sodium-containing ingredient (some dried fruits are processed with sodium sulfites        Vegetables Fresh, frozen vegetables and low- sodium canned vegetables Regular canned vegetables, sauerkraut, pickled vegetables, and others prepared in brine; frozen vegetables in sauces; vegetables seasoned with ham, bacon or salt pork  Condiments, Sauces, Miscellaneous  Salt substitute with physician's approval; pepper, herbs, spices; vinegar, lemon or lime juice; hot pepper sauce; garlic powder, onion powder, low sodium soy sauce (1 Tbs.); low sodium condiments (ketchup, chili sauce, mustard) in limited amounts (1 tsp.) fresh ground horseradish; unsalted tortilla chips, pretzels, potato chips, popcorn, salsa (1/4 cup) Any seasoning made with salt including garlic salt, celery salt, onion salt, and seasoned salt; sea salt, rock salt, kosher salt; meat tenderizers; monosodium glutamate; mustard, regular soy sauce, barbecue, sauce, chili sauce, teriyaki sauce, steak sauce,  Worcestershire sauce, and most flavored vinegars; canned gravy and mixes; regular condiments; salted snack foods, olives, picles, relish, horseradish sauce, catsup   Food preparation: Try these seasonings Meats:    Pork Sage, onion Serve with applesauce  Chicken Poultry seasoning, thyme, parsley Serve with cranberry sauce  Lamb Curry powder, rosemary, garlic, thyme Serve with mint sauce or jelly  Veal Marjoram, basil Serve with current jelly, cranberry sauce  Beef Pepper, bay leaf Serve with dry mustard, unsalted chive butter  Fish [place] leaf, dill Serve with unsalted lemon butter, unsalted parsley butter  Vegetables:    Asparagus Lemon juice   Broccoli Lemon juice   Carrots Mustard dressing parsley, mint, nutmeg, glazed with unsalted butter and sugar   Green beans Marjoram, lemon juice, nutmeg,dill seed   Tomatoes Basil, marjoram, onion   Spice /blend for [employer]" 4 tsp ground thyme 1 tsp ground sage 3 tsp ground rosemary 4 tsp ground marjoram   Test your knowledge 1. A product that says "Salt Free" may still contain sodium. True or False 2. Garlic Powder and Hot Pepper Sauce an be used as alternative seasonings.True or False 3. Processed foods have more sodium than fresh foods.  True or False 4. Canned Vegetables have less sodium than froze True or False  WAYS TO DECREASE YOUR SODIUM INTAKE 1. Avoid the use of added salt in cooking and at the table.  Table salt (and other prepared seasonings which contain salt) is probably one of the greatest sources of sodium in the diet.  Unsalted foods can gain flavor from the sweet, sour, and butter taste sensations of herbs and spices.  Instead of using salt for seasoning, try the following seasonings with the foods listed.  Remember: how you use them to enhance natural food flavors is limited only by your creativity... Allspice-Meat, fish, eggs, fruit, peas, red and yellow vegetables Almond Extract-Fruit baked goods Anise Seed-Sweet breads,  fruit, carrots, beets, cottage cheese, cookies (tastes like licorice) Basil-Meat, fish, eggs, vegetables, rice, vegetables salads, soups, sauces Bay Leaf-Meat, fish, stews, poultry Burnet-Salad, vegetables (cucumber-like flavor) Caraway Seed-Bread, cookies, cottage cheese, meat, vegetables, cheese, rice Cardamon-Baked goods, fruit, soups Celery Powder or seed-Salads, salad dressings, sauces, meatloaf, soup, bread.Do not use  celery salt Chervil-Meats, salads, fish, eggs, vegetables, cottage cheese (parsley-like flavor) Chili Power-Meatloaf, chicken cheese, corn, eggplant, egg dishes Chives-Salads cottage cheese, egg dishes, soups, vegetables, sauces Cilantro-Salsa, casseroles Cinnamon-Baked goods, fruit, pork, lamb, chicken, carrots Cloves-Fruit, baked goods, fish, pot roast, green beans, beets, carrots Coriander-Pastry, cookies, meat, salads, cheese (lemon-orange flavor) Cumin-Meatloaf, fish,cheese, eggs, cabbage,fruit pie (caraway flavor) [employer], fruit, eggs, fish, poultry, cottage cheese, vegetables Dill Seed-Meat, cottage cheese, poultry, vegetables, fish, salads, bread Fennel Seed-Bread, cookies, apples, pork, eggs, fish, beets, cabbage, cheese, Licorice-like flavor Garlic-(buds or powder) Salads, meat, poultry, fish, bread, butter, vegetables, potatoes.Do not  use garlic salt Ginger-Fruit, vegetables, baked goods, meat, fish, poultry Horseradish Root-Meet, vegetables, butter Lemon Juice or Extract-Vegetables, fruit, tea, baked goods, fish salads Mace-Baked goods fruit, vegetables, fish, poultry (taste like nutmeg) Maple Extract-Syrups Marjoram-Meat, chicken, fish, vegetables, breads, green salads (taste like Sage) Mint-Tea, lamb, sherbet, vegetables, desserts, carrots, cabbage Mustard, Dry or Seed-Cheese, eggs, meats, vegetables, poultry Nutmeg-Baked goods, fruit, chicken, eggs, vegetables, desserts Onion Powder-Meat, fish, poultry, vegetables, cheese, eggs, bread, rice  salads (Do not use   Onion salt) Orange Extract-Desserts, baked goods Oregano-Pasta, eggs, cheese, onions, pork, lamb, fish, chicken, vegetables, green salads Paprika-Meat, fish, poultry, eggs, cheese, vegetables Parsley Flakes-Butter, vegetables, meat fish, poultry, eggs, bread, salads (certain forms may   Contain sodium Pepper-Meat fish, poultry, vegetables, eggs Peppermint Extract-Desserts, baked goods Poppy Seed-Eggs, bread, cheese, fruit dressings, baked goods, noodles, vegetables, cottage  [employer], poultry, meat, fish, cauliflower, turnips,eggs bread Saffron-Rice, bread, veal, chicken, fish, eggs Sage-Meat, fish, poultry, onions, eggplant, tomateos, pork, stews Savory-Eggs, salads, poultry, meat, rice, vegetables, soups, pork Tarragon-Meat, poultry, fish, eggs, butter, vegetables (licorice-like flavor)  Thyme-Meat, poultry, fish, eggs, vegetables, (clover-like flavor), sauces, soups Tumeric-Salads, butter, eggs, fish, rice, vegetables (saffron-like flavor) Vanilla Extract-Baked goods, candy Vinegar-Salads, vegetables, meat marinades Walnut Extract-baked goods, candy  2. Choose your Foods Wisely   The following is a list of foods to avoid which are high in sodium:  Meats-Avoid all smoked, canned, salt cured, dried and kosher meat and fish as well as Anchovies   [employer] meats:Bologna, Liverwurst, Pastrami Canned meat or fish  Marinated herring Caviar    Pepperoni Corned Beef   Pizza Dried chipped beef  Salami Frozen breaded fish or meat Salt pork Frankfurters or hot dogs  Sardines Gefilte fish   Sausage Ham (boiled ham, Proscuitto Smoked butt    spiced ham)   Spam      TV Dinners Vegetables Canned vegetables (Regular) Relish Canned mushrooms  Sauerkraut Olives    Tomato juice Pickles  Bakery and Dessert Products Canned puddings  Cream pies Cheesecake   Decorated cakes Cookies  Beverages/Juices Tomato  juice, regular  Gatorade   V-8 vegetable juice, regular  Breads and Cereals Biscuit mixes   Salted potato chips, corn chips, pretzels Bread stuffing mixes  Salted crackers and rolls Pancake and waffle mixes Self-rising flour  Seasonings Accent    Meat sauces Barbecue sauce  Meat tenderizer Catsup    Monosodium glutamate (MSG) Celery salt   Onion salt Chili sauce   Prepared mustard Garlic salt   Salt, seasoned salt, sea salt Gravy mixes   Soy sauce Horseradish   Steak sauce Ketchup   Tartar sauce Lite salt    Teriyaki sauce Marinade mixes   Worcestershire sauce  Others Baking powder   Cocoa and cocoa mixes Baking soda   Commercial casserole mixes Candy-caramels, chocolate  Dehydrated soups    Bars, fudge,nougats  Instant rice and pasta mixes Canned broth or soup  Maraschino cherries Cheese, aged and processed cheese and cheese spreads  Learning Assessment Quiz  Indicated T (for True) or F (for False) for each of the following statements:  1. _____ Fresh fruits and vegetables and unprocessed grains are generally low in sodium 2. _____ Water may contain a considerable amount of sodium, depending on the source 3. _____ You can always tell if a food is high in sodium by tasting it 4. _____ Certain laxatives my be high in sodium and should be avoided unless prescribed   by a physician or pharmacist 5. _____ Salt substitutes may be used freely by anyone on a sodium restricted diet 6. _____ Sodium is present in table salt, food additives and as a natural component of   most foods 7. _____ Table salt is approximately 90% sodium 8. _____ Limiting sodium intake may help prevent excess fluid accumulation in the body 9. _____ On a sodium-restricted diet, seasonings such as bouillon soy sauce, and    cooking wine should be used in place of table salt 10. _____ On an ingredient list, a product which lists monosodium glutamate as the first   ingredient is an appropriate food to include on a  low sodium diet  Circle the best answer(s) to the following statements (Hint: there may be more than one correct answer)  11. On a low-sodium diet, some acceptable snack items are:    A. Olives  F. Bean dip   K. Grapefruit juice    B. Salted Pretzels G. Commercial Popcorn   L. Canned peaches    C. Carrot Sticks  H. Bouillon   M. Unsalted nuts   D. [place] fries  I. Peanut butter crackers N. Salami   E. Sweet pickles J. Tomato Juice   O. Pizza  12.  Seasonings that may be used freely on a reduced - sodium diet include   A. Lemon wedges F.Monosodium glutamate K. Celery seed    B.Soysauce   G. Pepper   L. Mustard powder   C. Sea salt  H. Cooking wine  M. Onion flakes   D. Vinegar  E. Prepared horseradish N. Salsa   E. Sage   J. Worcestershire sauce  O. Chutney    Steps to Quit Smoking Smoking tobacco is the leading cause of preventable death. It can affect almost every organ in the body. Smoking puts you and those around you at risk for developing many serious chronic diseases. Quitting smoking can be difficult, but it is one of the best things that you can do for your health. It is never too late to quit. How do I get ready to  quit? When you decide to quit smoking, create a plan to help you succeed. Before you quit:  Pick a date to quit. Set a date within the next 2 weeks to give you time to prepare.  Write down the reasons why you are quitting. Keep this list in places where you will see it often.  Tell your family, friends, and co-workers that you are quitting. Support from your loved ones can make quitting easier.  Talk with your health care provider about your options for quitting smoking.  Find out what treatment options are covered by your health insurance.  Identify people, places, things, and activities that make you want to smoke (triggers). Avoid them. What first steps can I take to quit smoking?  Throw away all cigarettes at home, at work, and in your  car.  Throw away smoking accessories, such as Scientist, research (medical).  Clean your car. Make sure to empty the ashtray.  Clean your home, including curtains and carpets. What strategies can I use to quit smoking? Talk with your health care provider about combining strategies, such as taking medicines while you are also receiving in-person counseling. Using these two strategies together makes you more likely to succeed in quitting than if you used either strategy on its own.  If you are pregnant or breastfeeding, talk with your health care provider about finding counseling or other support strategies to quit smoking. Do not take medicine to help you quit smoking unless your health care provider tells you to do so. To quit smoking: Quit right away  Quit smoking completely, instead of gradually reducing how much you smoke over a period of time. Research shows that stopping smoking right away is more successful than gradually quitting.  Attend in-person counseling to help you build problem-solving skills. You are more likely to succeed in quitting if you attend counseling sessions regularly. Even short sessions of 10 minutes can be effective. Take medicine You may take medicines to help you quit smoking. Some medicines require a prescription and some you can purchase over-the-counter. Medicines may have nicotine in them to replace the nicotine in cigarettes. Medicines may:  Help to stop cravings.  Help to relieve withdrawal symptoms. Your health care provider may recommend:  Nicotine patches, gum, or lozenges.  Nicotine inhalers or sprays.  Non-nicotine medicine that is taken by mouth. Find resources Find resources and support systems that can help you to quit smoking and remain smoke-free after you quit. These resources are most helpful when you use them often. They include:  Online chats with a Social worker.  Telephone quitlines.  Printed Furniture conservator/restorer.  Support groups or group  counseling.  Text messaging programs.  Mobile phone apps or applications. Use apps that can help you stick to your quit plan by providing reminders, tips, and encouragement. There are many free apps for mobile devices as well as websites. Examples include Quit Guide from the [employer] and smokefree.gov   What things can I do to make it easier to quit?  Reach out to your family and friends for support and encouragement. Call telephone quitlines (1-800-QUIT-NOW), reach out to support groups, or work with a counselor for support.  Ask people who smoke to avoid smoking around you.  Avoid places that trigger you to smoke, such as bars, parties, or smoke-break areas at work.  Spend time with people who do not smoke.  Lessen the stress in your life. Stress can be a smoking trigger for some people. To lessen stress, try: ?  Exercising regularly. ? Doing deep-breathing exercises. ? Doing yoga. ? Meditating. ? Performing a body scan. This involves closing your eyes, scanning your body from head to toe, and noticing which parts of your body are particularly tense. Try to relax the muscles in those areas.   How will I feel when I quit smoking? Day 1 to 3 weeks Within the first 24 hours of quitting smoking, you may start to feel withdrawal symptoms. These symptoms are usually most noticeable 2-3 days after quitting, but they usually do not last for more than 2-3 weeks. You may experience these symptoms:  Mood swings.  Restlessness, anxiety, or irritability.  Trouble concentrating.  Dizziness.  Strong cravings for sugary foods and nicotine.  Mild weight gain.  Constipation.  Nausea.  Coughing or a sore throat.  Changes in how the medicines that you take for unrelated issues work in your body.  Depression.  Trouble sleeping (insomnia). Week 3 and afterward After the first 2-3 weeks of quitting, you may start to notice more positive results, such as:  Improved sense of smell and  taste.  Decreased coughing and sore throat.  Slower heart rate.  Lower blood pressure.  Clearer skin.  The ability to breathe more easily.  Fewer sick days. Quitting smoking can be very challenging. Do not get discouraged if you are not successful the first time. Some people need to make many attempts to quit before they achieve long-term success. Do your best to stick to your quit plan, and talk with your health care provider if you have any questions or concerns. Summary  Smoking tobacco is the leading cause of preventable death. Quitting smoking is one of the best things that you can do for your health.  When you decide to quit smoking, create a plan to help you succeed.  Quit smoking right away, not slowly over a period of time.  When you start quitting, seek help from your health care provider, family, or friends. This information is not intended to replace advice given to you by your health care provider. Make sure you discuss any questions you have with your health care provider. Document Revised: 10/01/2018 Document Reviewed: 03/27/2018 Elsevier Patient Education  [place].   Exercise Information for Aging Adults Staying physically active is important as you age. The four types of exercises that are best for older adults are endurance, strength, balance, and flexibility. Contact your health care provider before you start any exercise routine. Ask your health care provider what activities are safe for you. What are the risks? Risks associated with exercising include:  Overdoing it. This may lead to sore muscles or fatigue.  Falls.  Injuries.  Dehydration. How to do these exercises Endurance exercises Endurance (aerobic) exercises raise your breathing rate and heart rate. Increasing your endurance helps you to do everyday tasks and stay healthy. By improving the health of your body system that includes your heart, lungs, and blood vessels (circulatory  system), you may also delay or prevent diseases such as heart disease, diabetes, and bone loss (osteoporosis). Types of endurance exercises include:  Sports.  Indoor activities, such as using gym equipment, doing water aerobics, or dancing.  Outdoor activities, such as biking or jogging.  Tasks around the house, such as gardening, yard work, and heavy household chores like cleaning.  Walking, such as hiking or walking around your neighborhood. When doing endurance exercises, make sure you:  Are aware of your surroundings.  Use safety equipment as directed.  Dress in  layers when exercising outdoors.  Drink plenty of water to stay well hydrated. Build up endurance slowly. Start with 10 minutes at a time, and gradually build up to doing 30 minutes at a time. Unless your health care provider gave you different instructions, aim to exercise for a total of 150 minutes a week. Spread out that time so you are working on endurance on 3 or more days a week.   Strength exercises Lifting, pulling, or pushing weights helps to strengthen muscles. Having stronger muscles makes it easier to do everyday activities, such as getting up from a chair, climbing stairs, carrying groceries, and playing with grandchildren. Strength exercises include arm and leg exercises that may be done:  With weights.  Without weights (using your own body weight).  With a resistance band. When doing strength exercises:  Move smoothly and steadily. Do not suddenly thrust or jerk the weights, the resistance band, or your body.  Start with no weights or with light weights, and gradually add more weight over time. Eventually, aim to use weights that are hard or very hard for you to lift. This means that you are able to do 8 repetitions with the weight, and the last few repetitions are very challenging.  Lift or push weights into position for 3 seconds, hold the position for 1 second, and then take 3 seconds to return to your  starting position.  Breathe out (exhale) during difficult movements, like lifting or pushing weights. Breathe in (inhale) to relax your muscles before the next repetition.  Consider alternating arms or legs, especially when you first start strength exercises.  Expect some slight muscle soreness after each session. Do strength exercises on 2 or more days a week, for 30 minutes at a time. Avoid exercising the same muscle groups two days in a row. For example, if you work on your leg muscles one day, work on your arm muscles the next day. When you can do two sets of 10-15 repetitions with a certain weight, increase the amount of weight.   Balance Balance exercises can help to prevent falls. Balance exercises include:  Standing on one foot.  Heel-to-toe walk.  Balance walk.  Tai chi. Make sure you have something sturdy to hold onto while doing balance exercises, such as a sturdy chair. As your balance improves, challenge yourself by holding onto the chair with one hand instead of two, and then with no hands. Trying exercises with your eyes closed also challenges your balance, but be sure to have a sturdy surface (like a countertop) close by in case you need it. Do balance exercises as often as you want, or as often as directed by your health care provider. Strength exercises for the lower body also help to improve balance. Flexibility Flexibility exercises improve how far you can bend, straighten, move, or rotate parts of your body (range of motion). These exercises also help you to do everyday activities such as getting dressed or reaching for objects. Flexibility exercises include stretching different parts of the body, and they may be done in a standing or seated position or on the floor. When stretching, make sure you:  Keep a slight bend in your arms and legs. Avoid completely straightening ("locking") your joints.  Do not stretch so far that you feel pain. You should feel a mild  stretching feeling. You may try stretching farther as you become more flexible over time.  Relax and breathe between stretches.  Hold onto something sturdy for balance as needed.  Hold each stretch for 10-30 seconds. Repeat each stretch 3-5 times.   General safety tips  Exercise in well-lit areas.  Do not hold your breath during exercises or stretches.  Warm up before exercising, and cool down after exercising. This can help prevent injury.  Drink plenty of water during exercise or any activity that makes you sweat.  Use smooth, steady movements. Do not use sudden, jerking movements, especially when lifting weights or doing flexibility exercises.  If you are not sure if an exercise is safe for you, or you are not sure how to do an exercise, talk with your health care provider. This is especially important if you have had surgery on muscles, bones, or joints (orthopedic surgery). Where to find more information You can find more information about exercise for older adults from:  Your local health department, fitness center, or community center. These facilities may have programs for aging adults.  [employer] on Aging: http://kim-miller.com/  National Council on Aging: www.ncoa.org Summary  Staying physically active is important as you age.  Make sure to contact your health care provider before you start any exercise routine. Ask your health care provider what activities are safe for you.  Doing endurance, strength, balance, and flexibility exercises can help to delay or prevent certain diseases, such as heart disease, diabetes, and bone loss (osteoporosis). This information is not intended to replace advice given to you by your health care provider. Make sure you discuss any questions you have with your health care provider. Document Revised: 04/27/2019 Document Reviewed: 04/27/2019 Elsevier Patient Education  2021 [employer].

## 2020-05-15 DIAGNOSIS — Z23 Encounter for immunization: Secondary | ICD-10-CM | POA: Diagnosis not present

## 2020-05-22 DIAGNOSIS — I2510 Atherosclerotic heart disease of native coronary artery without angina pectoris: Secondary | ICD-10-CM | POA: Diagnosis not present

## 2020-05-22 DIAGNOSIS — G894 Chronic pain syndrome: Secondary | ICD-10-CM | POA: Diagnosis not present

## 2020-05-22 DIAGNOSIS — E2740 Unspecified adrenocortical insufficiency: Secondary | ICD-10-CM | POA: Diagnosis not present

## 2020-05-22 DIAGNOSIS — E271 Primary adrenocortical insufficiency: Secondary | ICD-10-CM | POA: Diagnosis not present

## 2020-05-22 DIAGNOSIS — M159 Polyosteoarthritis, unspecified: Secondary | ICD-10-CM | POA: Diagnosis not present

## 2020-05-22 DIAGNOSIS — K219 Gastro-esophageal reflux disease without esophagitis: Secondary | ICD-10-CM | POA: Diagnosis not present

## 2020-05-22 DIAGNOSIS — Z0000 Encounter for general adult medical examination without abnormal findings: Secondary | ICD-10-CM | POA: Diagnosis not present

## 2020-05-22 DIAGNOSIS — I10 Essential (primary) hypertension: Secondary | ICD-10-CM | POA: Diagnosis not present

## 2020-05-23 ENCOUNTER — Other Ambulatory Visit: Payer: Self-pay | Admitting: Cardiology

## 2020-05-25 ENCOUNTER — Ambulatory Visit: Payer: Medicare Other | Admitting: Gastroenterology

## 2020-05-29 ENCOUNTER — Ambulatory Visit (INDEPENDENT_AMBULATORY_CARE_PROVIDER_SITE_OTHER): Payer: Medicare Other | Admitting: Gastroenterology

## 2020-05-29 ENCOUNTER — Encounter: Payer: Self-pay | Admitting: Gastroenterology

## 2020-05-29 ENCOUNTER — Other Ambulatory Visit: Payer: Self-pay

## 2020-05-29 VITALS — BP 133/76 | HR 82 | Temp 96.9°F | Ht 66.0 in | Wt 223.8 lb

## 2020-05-29 DIAGNOSIS — K219 Gastro-esophageal reflux disease without esophagitis: Secondary | ICD-10-CM

## 2020-05-29 MED ORDER — ESOMEPRAZOLE MAGNESIUM 20 MG PO CPDR: 20.0000 mg | DELAYED_RELEASE_CAPSULE | Freq: Two times a day (BID) | ORAL | 3 refills | Status: DC

## 2020-05-29 NOTE — Progress Notes (Signed)
Primary Care Physician: Redmond School, MD  Primary Gastroenterologist:  Garfield Cornea, MD   Chief Complaint  Patient presents with  . Gastroesophageal Reflux    Ran out of Rx for nexium few days ago so she purchased OTC nexium but does not feel it works as well    HPI: Debra Graves is a 65 y.o. female here for follow-up of reflux and constipation.  Last seen in 2019.  Screening colonoscopy due in 2025.  Overall doing ok. She is having some breakthrough heartburn in the evenings. Especially if she eats too late. Recently ran out of nexium and had to get over the counter nexium. No dysphagia, abdominal pain. BM regular. No melena, brbpr. Frustrated with weight gain. Has gained about 10 pounds per year for the past several years. Felt her best when she was between 175-185 pounds.   Current Outpatient Medications  Medication Sig Dispense Refill  . aspirin 81 MG tablet Take 81 mg by mouth daily.    . clonazePAM (KLONOPIN) 0.5 MG tablet Take 0.5 mg by mouth daily.    [name] Calcium (STOOL SOFTENER PO) Take 2 tablets by mouth daily.    Marland Kitchen escitalopram (LEXAPRO) 20 MG tablet Take 20 mg by mouth daily.    Marland Kitchen esomeprazole (NEXIUM) 40 MG capsule TAKE 1 CAPSULE BY MOUTH DAILY BEFORE BREAKFAST. 30 capsule 5  . fish oil-omega-3 fatty acids 1000 MG capsule Take 2 g by mouth daily.    . furosemide (LASIX) 20 MG tablet Take 20 mg by mouth 2 (two) times daily.    . hydrocortisone (CORTEF) 10 MG tablet Take 10 mg by mouth 2 (two) times daily.    Marland Kitchen LORazepam (ATIVAN) 1 MG tablet Take 1 mg by mouth every 6 (six) hours as needed for anxiety.    . meloxicam (MOBIC) 7.5 MG tablet Take 7.5 mg by mouth 2 (two) times daily.    . Multiple Vitamin (MULTIVITAMIN) capsule Take 1 capsule by mouth daily. Reported on 03/29/2015    . rosuvastatin (CRESTOR) 20 MG tablet TAKE (1) TABLET BY MOUTH AT BEDTIME. 90 tablet 3  . Vitamin D, Ergocalciferol, 2000 units CAPS Take by mouth daily.      No current  facility-administered medications for this visit.    Allergies as of 05/29/2020 - Review Complete 05/29/2020  Allergen Reaction Noted  . Albuterol  04/14/2012  . Bactrim [sulfamethoxazole-trimethoprim]  04/14/2012  . Vytorin [ezetimibe-simvastatin]  03/25/2013    ROS:  General: Negative for anorexia, weight loss, fever, chills, fatigue, weakness. ENT: Negative for hoarseness, difficulty swallowing , nasal congestion. CV: Negative for chest pain, angina, palpitations, dyspnea on exertion, peripheral edema.  Respiratory: Negative for dyspnea at rest, dyspnea on exertion, cough, sputum, wheezing.  GI: See history of present illness. GU:  Negative for dysuria, hematuria, urinary incontinence, urinary frequency, nocturnal urination.  Endo: Negative for unusual weight change.    Physical Examination:   BP 133/76   Pulse 82   Temp (!) 96.9 F (36.1 C)   Ht 5\' 6"  (1.676 m)   Wt 223 lb 12.8 oz (101.5 kg)   BMI 36.12 kg/m   General: Well-nourished, well-developed in no acute distress.  Eyes: No icterus. Mouth: masked  Abdomen: Bowel sounds are normal, nontender, nondistended, no hepatosplenomegaly or masses, no abdominal bruits or hernia , no rebound or guarding.   Extremities: No lower extremity edema. No clubbing or deformities. Neuro: Alert and oriented x 4   Skin: Warm and dry, no jaundice.  Psych: Alert and cooperative, normal mood and affect.    Imaging Studies: No results found.   Assessment: 65 year old female with history of chronic GERD.  Has some breakthrough nighttime symptoms if she is not careful with her diet.  Denies any alarm symptoms.  She would like to try taking twice daily dosing.  We discussed trying 20 mg twice daily at first to try to minimize medication.  Plan: Nexium 20 mg twice daily before meals.  She will call if symptoms are not adequately controlled. Reinforced antireflux measures, dietary and lifestyle modifications. Encourage slow gradual  weight loss, goal of 10 pounds over the next 3 months, ideally get back below 200 pounds. Return to the office in 2 years or call sooner if needed.

## 2020-05-29 NOTE — Patient Instructions (Signed)
1. New RX for Nexium 20mg  to take one before breakfast and one before evening meal. Let me know if your symptoms are not well controlled. 2. Try to be more active and drop 10 pounds over the next 3 months. This will help with overall health but also with better management of your acid reflux. Ideally would like to see you get below 200 pounds.  3. Return to the office in two years or call sooner if needed.    [employer]'s Current Therapy 2021 (pp. 213-216). [place], [place]: Elsevier.">  Gastroesophageal Reflux Disease, Adult Gastroesophageal reflux (GER) happens when acid from the stomach flows up into the tube that connects the mouth and the stomach (esophagus). Normally, food travels down the esophagus and stays in the stomach to be digested. However, when a person has GER, food and stomach acid sometimes move back up into the esophagus. If this becomes a more serious problem, the person may be diagnosed with a disease called gastroesophageal reflux disease (GERD). GERD occurs when the reflux:  Happens often.  Causes frequent or severe symptoms.  Causes problems such as damage to the esophagus. When stomach acid comes in contact with the esophagus, the acid may cause inflammation in the esophagus. Over time, GERD may create small holes (ulcers) in the lining of the esophagus. What are the causes? This condition is caused by a problem with the muscle between the esophagus and the stomach (lower esophageal sphincter, or LES). Normally, the LES muscle closes after food passes through the esophagus to the stomach. When the LES is weakened or abnormal, it does not close properly, and that allows food and stomach acid to go back up into the esophagus. The LES can be weakened by certain dietary substances, medicines, and medical conditions, including:  Tobacco use.  Pregnancy.  Having a hiatal hernia.  Alcohol use.  Certain foods and beverages, such as coffee, chocolate, onions, and  peppermint. What increases the risk? You are more likely to develop this condition if you:  Have an increased body weight.  Have a connective tissue disorder.  Take NSAIDs, such as ibuprofen. What are the signs or symptoms? Symptoms of this condition include:  Heartburn.  Difficult or painful swallowing and the feeling of having a lump in the throat.  A bitter taste in the mouth.  Bad breath and having a large amount of saliva.  Having an upset or bloated stomach and belching.  Chest pain. Different conditions can cause chest pain. Make sure you see your health care provider if you experience chest pain.  Shortness of breath or wheezing.  Ongoing (chronic) cough or a nighttime cough.  Wearing away of tooth enamel.  Weight loss. How is this diagnosed? This condition may be diagnosed based on a medical history and a physical exam. To determine if you have mild or severe GERD, your health care provider may also monitor how you respond to treatment. You may also have tests, including:  A test to examine your stomach and esophagus with a small camera (endoscopy).  A test that measures the acidity level in your esophagus.  A test that measures how much pressure is on your esophagus.  A barium swallow or modified barium swallow test to show the shape, size, and functioning of your esophagus. How is this treated? Treatment for this condition may vary depending on how severe your symptoms are. Your health care provider may recommend:  Changes to your diet.  Medicine.  Surgery. The goal of treatment is to help  relieve your symptoms and to prevent complications. Follow these instructions at home: Eating and drinking  Follow a diet as recommended by your health care provider. This may involve avoiding foods and drinks such as: ? Coffee and tea, with or without caffeine. ? Drinks that contain alcohol. ? Energy drinks and sports drinks. ? Carbonated drinks or  sodas. ? Chocolate and cocoa. ? Peppermint and mint flavorings. ? Garlic and onions. ? Horseradish. ? Spicy and acidic foods, including peppers, chili powder, curry powder, vinegar, hot sauces, and barbecue sauce. ? Citrus fruit juices and citrus fruits, such as oranges, lemons, and limes. ? Tomato-based foods, such as red sauce, chili, salsa, and pizza with red sauce. ? Fried and fatty foods, such as donuts, french fries, potato chips, and high-fat dressings. ? High-fat meats, such as hot dogs and fatty cuts of red and white meats, such as rib eye steak, sausage, ham, and bacon. ? High-fat dairy items, such as whole milk, butter, and cream cheese.  Eat small, frequent meals instead of large meals.  Avoid drinking large amounts of liquid with your meals.  Avoid eating meals during the 2-3 hours before bedtime.  Avoid lying down right after you eat.  Do not exercise right after you eat.   Lifestyle  Do not use any products that contain nicotine or tobacco. These products include cigarettes, chewing tobacco, and vaping devices, such as e-cigarettes. If you need help quitting, ask your health care provider.  Try to reduce your stress by using methods such as yoga or meditation. If you need help reducing stress, ask your health care provider.  If you are overweight, reduce your weight to an amount that is healthy for you. Ask your health care provider for guidance about a safe weight loss goal.   General instructions  Pay attention to any changes in your symptoms.  Take over-the-counter and prescription medicines only as told by your health care provider. Do not take aspirin, ibuprofen, or other NSAIDs unless your health care provider told you to take these medicines.  Wear loose-fitting clothing. Do not wear anything tight around your waist that causes pressure on your abdomen.  Raise (elevate) the head of your bed about 6 inches (15 cm). You can use a wedge to do this.  Avoid  bending over if this makes your symptoms worse.  Keep all follow-up visits. This is important. Contact a health care provider if:  You have: ? New symptoms. ? Unexplained weight loss. ? Difficulty swallowing or it hurts to swallow. ? Wheezing or a persistent cough. ? A hoarse voice.  Your symptoms do not improve with treatment. Get help right away if:  You have sudden pain in your arms, neck, jaw, teeth, or back.  You suddenly feel sweaty, dizzy, or light-headed.  You have chest pain or shortness of breath.  You vomit and the vomit is green, yellow, or black, or it looks like blood or coffee grounds.  You faint.  You have stool that is red, bloody, or black.  You cannot swallow, drink, or eat. These symptoms may represent a serious problem that is an emergency. Do not wait to see if the symptoms will go away. Get medical help right away. Call your local emergency services (911 in the [place] Do not drive yourself to the hospital. Summary  Gastroesophageal reflux happens when acid from the stomach flows up into the esophagus. GERD is a disease in which the reflux happens often, causes frequent or severe symptoms, or causes  problems such as damage to the esophagus.  Treatment for this condition may vary depending on how severe your symptoms are. Your health care provider may recommend diet and lifestyle changes, medicine, or surgery.  Contact a health care provider if you have new or worsening symptoms.  Take over-the-counter and prescription medicines only as told by your health care provider. Do not take aspirin, ibuprofen, or other NSAIDs unless your health care provider told you to do so.  Keep all follow-up visits as told by your health care provider. This is important. This information is not intended to replace advice given to you by your health care provider. Make sure you discuss any questions you have with your health care provider. Document Revised: 07/18/2019  Document Reviewed: 07/18/2019 Elsevier Patient Education  2021 [place] for Gastroesophageal Reflux Disease, Adult When you have gastroesophageal reflux disease (GERD), the foods you eat and your eating habits are very important. Choosing the right foods can help ease the discomfort of GERD. Consider working with a dietitian to help you make healthy food choices. What are tips for following this plan? Reading food labels  Look for foods that are low in saturated fat. Foods that have less than 5% of daily value (DV) of fat and 0 g of trans fats may help with your symptoms. Cooking  Cook foods using methods other than frying. This may include baking, steaming, grilling, or broiling. These are all methods that do not need a lot of fat for cooking.  To add flavor, try to use herbs that are low in spice and acidity. Meal planning  Choose healthy foods that are low in fat, such as fruits, vegetables, whole grains, low-fat dairy products, lean meats, fish, and poultry.  Eat frequent, small meals instead of three large meals each day. Eat your meals slowly, in a relaxed setting. Avoid bending over or lying down until 2-3 hours after eating.  Limit high-fat foods such as fatty meats or fried foods.  Limit your intake of fatty foods, such as oils, butter, and shortening.  Avoid the following as told by your health care provider: ? Foods that cause symptoms. These may be different for different people. Keep a food diary to keep track of foods that cause symptoms. ? Alcohol. ? Drinking large amounts of liquid with meals. ? Eating meals during the 2-3 hours before bed.   Lifestyle  Maintain a healthy weight. Ask your health care provider what weight is healthy for you. If you need to lose weight, work with your health care provider to do so safely.  Exercise for at least 30 minutes on 5 or more days each week, or as told by your health care provider.  Avoid wearing  clothes that fit tightly around your waist and chest.  Do not use any products that contain nicotine or tobacco. These products include cigarettes, chewing tobacco, and vaping devices, such as e-cigarettes. If you need help quitting, ask your health care provider.  Sleep with the head of your bed raised. Use a wedge under the mattress or blocks under the bed frame to raise the head of the bed.  Chew sugar-free gum after mealtimes. What foods should I eat? Eat a healthy, well-balanced diet of fruits, vegetables, whole grains, low-fat dairy products, lean meats, fish, and poultry. Each person is different. Foods that may trigger symptoms in one person may not trigger any symptoms in another person. Work with your health care provider to identify foods  that are safe for you. The items listed above may not be a complete list of recommended foods and beverages. Contact a dietitian for more information.   What foods should I avoid? Limiting some of these foods may help manage the symptoms of GERD. Everyone is different. Consult a dietitian or your health care provider to help you identify the exact foods to avoid, if any. Fruits Any fruits prepared with added fat. Any fruits that cause symptoms. For some people this may include citrus fruits, such as oranges, grapefruit, pineapple, and lemons. Vegetables Deep-fried vegetables. [place] fries. Any vegetables prepared with added fat. Any vegetables that cause symptoms. For some people, this may include tomatoes and tomato products, chili peppers, onions and garlic, and horseradish. Grains Pastries or quick breads with added fat. Meats and other proteins High-fat meats, such as fatty beef or pork, hot dogs, ribs, ham, sausage, salami, and bacon. Fried meat or protein, including fried fish and fried chicken. Nuts and nut butters, in large amounts. Dairy Whole milk and chocolate milk. Sour cream. Cream. Ice cream. Cream cheese. Milkshakes. Fats and  oils Butter. Margarine. Shortening. Ghee. Beverages Coffee and tea, with or without caffeine. Carbonated beverages. Sodas. Energy drinks. Fruit juice made with acidic fruits, such as orange or grapefruit. Tomato juice. Alcoholic drinks. Sweets and desserts Chocolate and cocoa. Donuts. Seasonings and condiments Pepper. Peppermint and spearmint. Added salt. Any condiments, herbs, or seasonings that cause symptoms. For some people, this may include curry, hot sauce, or vinegar-based salad dressings. The items listed above may not be a complete list of foods and beverages to avoid. Contact a dietitian for more information. Questions to ask your health care provider Diet and lifestyle changes are usually the first steps that are taken to manage symptoms of GERD. If diet and lifestyle changes do not improve your symptoms, talk with your health care provider about taking medicines. Where to find more information  International Foundation for Gastrointestinal Disorders: aboutgerd.org Summary  When you have gastroesophageal reflux disease (GERD), food and lifestyle choices may be very helpful in easing the discomfort of GERD.  Eat frequent, small meals instead of three large meals each day. Eat your meals slowly, in a relaxed setting. Avoid bending over or lying down until 2-3 hours after eating.  Limit high-fat foods such as fatty meats or fried foods. This information is not intended to replace advice given to you by your health care provider. Make sure you discuss any questions you have with your health care provider. Document Revised: 07/18/2019 Document Reviewed: 07/18/2019 Elsevier Patient Education  [employer].

## 2020-05-30 DIAGNOSIS — Z0000 Encounter for general adult medical examination without abnormal findings: Secondary | ICD-10-CM | POA: Diagnosis not present

## 2020-05-30 DIAGNOSIS — E538 Deficiency of other specified B group vitamins: Secondary | ICD-10-CM | POA: Diagnosis not present

## 2020-05-30 DIAGNOSIS — E039 Hypothyroidism, unspecified: Secondary | ICD-10-CM | POA: Diagnosis not present

## 2020-05-30 DIAGNOSIS — E559 Vitamin D deficiency, unspecified: Secondary | ICD-10-CM | POA: Diagnosis not present

## 2020-05-30 DIAGNOSIS — Z1389 Encounter for screening for other disorder: Secondary | ICD-10-CM | POA: Diagnosis not present

## 2020-05-30 NOTE — Progress Notes (Signed)
CC'ED TO PCP 

## 2020-06-08 ENCOUNTER — Other Ambulatory Visit: Payer: Self-pay

## 2020-06-08 ENCOUNTER — Ambulatory Visit (HOSPITAL_COMMUNITY)
Admission: RE | Admit: 2020-06-08 | Discharge: 2020-06-08 | Disposition: A | Discharge status: Home / Self Care | Payer: Medicare Other | Source: Ambulatory Visit | Attending: Physician Assistant | Admitting: Physician Assistant

## 2020-06-08 DIAGNOSIS — I351 Nonrheumatic aortic (valve) insufficiency: Secondary | ICD-10-CM | POA: Insufficient documentation

## 2020-06-08 LAB — ECHOCARDIOGRAM COMPLETE
AR max vel: 2.25 cm2
AV Area VTI: 2.61 cm2
AV Area mean vel: 2.38 cm2
AV Mean grad: 4.4 mmHg
AV Peak grad: 9.4 mmHg
Ao pk vel: 1.53 m/s
Area-P 1/2: 3.63 cm2
P 1/2 time: 406 msec
S' Lateral: 2.02 cm

## 2020-06-08 NOTE — Progress Notes (Signed)
*  PRELIMINARY RESULTS* Echocardiogram 2D Echocardiogram has been performed.  Leavy Cella 06/08/2020, 1:54 PM

## 2020-08-29 DIAGNOSIS — I10 Essential (primary) hypertension: Secondary | ICD-10-CM | POA: Diagnosis not present

## 2020-08-29 DIAGNOSIS — M1991 Primary osteoarthritis, unspecified site: Secondary | ICD-10-CM | POA: Diagnosis not present

## 2020-11-26 DIAGNOSIS — Z23 Encounter for immunization: Secondary | ICD-10-CM | POA: Diagnosis not present

## 2020-11-26 DIAGNOSIS — I10 Essential (primary) hypertension: Secondary | ICD-10-CM | POA: Diagnosis not present

## 2020-11-26 DIAGNOSIS — M159 Polyosteoarthritis, unspecified: Secondary | ICD-10-CM | POA: Diagnosis not present

## 2020-12-24 ENCOUNTER — Other Ambulatory Visit (HOSPITAL_COMMUNITY): Payer: Self-pay | Admitting: Internal Medicine

## 2020-12-24 DIAGNOSIS — Z1231 Encounter for screening mammogram for malignant neoplasm of breast: Secondary | ICD-10-CM

## 2021-01-07 ENCOUNTER — Other Ambulatory Visit: Payer: Self-pay

## 2021-01-07 ENCOUNTER — Ambulatory Visit (HOSPITAL_COMMUNITY)
Admission: RE | Admit: 2021-01-07 | Discharge: 2021-01-07 | Disposition: A | Discharge status: Home / Self Care | Payer: Medicare Other | Source: Ambulatory Visit | Attending: Internal Medicine | Admitting: Internal Medicine

## 2021-01-07 DIAGNOSIS — Z1231 Encounter for screening mammogram for malignant neoplasm of breast: Secondary | ICD-10-CM | POA: Diagnosis not present

## 2021-02-22 DIAGNOSIS — I10 Essential (primary) hypertension: Secondary | ICD-10-CM | POA: Diagnosis not present

## 2021-02-22 DIAGNOSIS — E271 Primary adrenocortical insufficiency: Secondary | ICD-10-CM | POA: Diagnosis not present

## 2021-02-22 DIAGNOSIS — M159 Polyosteoarthritis, unspecified: Secondary | ICD-10-CM | POA: Diagnosis not present

## 2021-05-06 ENCOUNTER — Other Ambulatory Visit: Payer: Self-pay | Admitting: Cardiology

## 2021-05-20 ENCOUNTER — Other Ambulatory Visit: Payer: Self-pay | Admitting: Gastroenterology

## 2021-05-20 NOTE — Telephone Encounter (Signed)
Last ov 05/29/2020 ?

## 2021-06-05 ENCOUNTER — Ambulatory Visit
Admission: EM | Admit: 2021-06-05 | Discharge: 2021-06-05 | Disposition: A | Discharge status: Home / Self Care | Payer: Medicare Other | Attending: Nurse Practitioner | Admitting: Nurse Practitioner

## 2021-06-05 ENCOUNTER — Ambulatory Visit (INDEPENDENT_AMBULATORY_CARE_PROVIDER_SITE_OTHER): Payer: Medicare Other

## 2021-06-05 ENCOUNTER — Ambulatory Visit: Payer: Medicare Other

## 2021-06-05 DIAGNOSIS — M25512 Pain in left shoulder: Secondary | ICD-10-CM | POA: Diagnosis not present

## 2021-06-05 DIAGNOSIS — M7918 Myalgia, other site: Secondary | ICD-10-CM | POA: Diagnosis not present

## 2021-06-05 DIAGNOSIS — Z043 Encounter for examination and observation following other accident: Secondary | ICD-10-CM | POA: Diagnosis not present

## 2021-06-05 NOTE — ED Provider Notes (Signed)
?[place] ? ? ? ?CSN: [id number] ?Arrival date & time: 06/05/21  1622 ? ? ?  ? ?History   ?Chief Complaint ?Chief Complaint  ?Patient presents with  ? Shoulder Pain  ? ? ?HPI ?Debra Graves is a 66 y.o. female.  ? ?The patient is a 66 year old female who presents with left-sided middle back pain and flank pain after she fell off of a lawn more 1 day ago.  Patient states that she was trying to pick up a stick and somehow disengaged along more into reverse.  States that she had to almost fall off of the lawn more to keep it from running over her.  Since that time she has had pain in the left middle back.  She states pain is worsened with certain movement and with lifting.  States that she has taken Tylenol for her symptoms.  She denies chest pain, shortness of breath, difficulty breathing.  She currently does not take any blood thinners. ? ?The history is provided by the patient.  ? ?Past Medical History:  ?Diagnosis Date  ? Adrenal insufficiency ([place])   ? Anxiety   ? Aortic insufficiency   ? Moderate 2015  ? Arthritis   ? Back pain   ? CAD (coronary artery disease)   ? Moderate ostial RCA disease at cardiac catheterization 2006  ? Constipation   ? Depression   ? Fibroids   ? Hypercholesteremia   ? Rectocele   ? ? ?Patient Active Problem List  ? Diagnosis Date Noted  ? Tobacco abuse 06/09/2016  ? Adrenal insufficiency ([place]) 03/16/2015  ? Encounter for screening colonoscopy 05/25/2013  ? GERD (gastroesophageal reflux disease) 05/25/2013  ? Aortic insufficiency 02/22/2013  ? CAD (coronary artery disease) 02/22/2013  ? Rectocele 04/14/2012  ? Constipation 04/14/2012  ? Heart disease 04/13/2012  ? Hypercholesteremia 04/13/2012  ? Fibroids 04/13/2012  ? Arthritis 04/13/2012  ? Anxiety 04/13/2012  ? Depression 04/13/2012  ? ? ?Past Surgical History:  ?Procedure Laterality Date  ? CARDIAC CATHETERIZATION  03/08/2004  ? mild progressive CAD  ? carotid duplex  03/09/2007  ? rt subclavian 0 to 49%,right bulb 0 to  49 %,lf ICA normal  ? COLONOSCOPY N/A 06/16/2013  ? Dr. Gala Romney: hyperplastic polyps, melanosis coli   ? DOPPLER ECHOCARDIOGRAPHY  03/20/2010  ? EF >55 %,trace mitral regurg,mild tricuspid regurg,aortic valve mildly sclerotic'mild to moderate aortic regurg,trace pulmonic valver regurg  ? NM MYOCAR PERF WALL MOTION  03/19/06  ? EF 61%,low risk study  ? ? ?OB History   ? ? Gravida  ?1  ? Para  ?1  ? Term  ?   ? Preterm  ?1  ? AB  ?   ? Living  ?   ?  ? ? SAB  ?   ? IAB  ?   ? Ectopic  ?   ? Multiple  ?   ? Live Births  ?   ?   ?  ?  ? ? ? ?Home Medications   ? ?Prior to Admission medications   ?Medication Sig Start Date End Date Taking? Authorizing Provider  ?aspirin 81 MG tablet Take 81 mg by mouth daily.    [provider]  ?clonazePAM (KLONOPIN) 0.5 MG tablet Take 0.5 mg by mouth daily.    [provider]  ?Docusate Calcium (STOOL SOFTENER PO) Take 2 tablets by mouth daily.    [provider]  ?escitalopram (LEXAPRO) 20 MG tablet Take 20 mg by mouth daily.  [provider]  ?esomeprazole (NEXIUM) 20 MG capsule TAKE (1) CAPSULE BY MOUTH TWICE DAILY BEFORE MEALS. 05/24/21   Mahala Menghini, PA-C  ?fish oil-omega-3 fatty acids 1000 MG capsule Take 2 g by mouth daily.    [provider]  ?furosemide (LASIX) 20 MG tablet Take 20 mg by mouth 2 (two) times daily. 04/23/20   [provider]  ?hydrocortisone (CORTEF) 10 MG tablet Take 10 mg by mouth 2 (two) times daily.    [provider]  ?LORazepam (ATIVAN) 1 MG tablet Take 1 mg by mouth every 6 (six) hours as needed for anxiety.    [provider]  ?meloxicam (MOBIC) 7.5 MG tablet Take 7.5 mg by mouth 2 (two) times daily. 11/23/18   [provider]  ?Multiple Vitamin (MULTIVITAMIN) capsule Take 1 capsule by mouth daily. Reported on 03/29/2015    [provider]  ?rosuvastatin (CRESTOR) 20 MG tablet TAKE (1) TABLET BY MOUTH AT BEDTIME. 05/06/21   Satira Sark, MD  ?Vitamin D,  Ergocalciferol, 2000 units CAPS Take by mouth daily.     [provider]  ? ? ?Family History ?Family History  ?Problem Relation Age of Onset  ? Cancer Mother 42  ?     Breast then bones and all over  ? Coronary artery disease Father   ? Heart disease Father   ? Stroke Father   ? Hypertension Sister   ? Breast cancer Sister 68  ? Heart disease Sister   ? Stroke Brother   ? Coronary artery disease Brother   ? Coronary artery disease Brother   ? Coronary artery disease Brother   ? Colon cancer Neg Hx   ? ? ?Social History ?Social History  ? ?Tobacco Use  ? Smoking status: Former  ?  Packs/day: 0.50  ?  Years: 41.00  ?  Pack years: 20.50  ?  Types: Cigarettes  ? Smokeless tobacco: Never  ?Vaping Use  ? Vaping Use: Never used  ?Substance Use Topics  ? Alcohol use: No  ? Drug use: No  ? ? ? ?Allergies   ?Albuterol, Bactrim [sulfamethoxazole-trimethoprim], and Vytorin [ezetimibe-simvastatin] ? ? ?Review of Systems ?Review of Systems  ?Constitutional: Negative.   ?Respiratory: Negative.    ?Musculoskeletal:  Positive for back pain.  ?Skin: Negative.   ?Psychiatric/Behavioral: Negative.    ? ? ?Physical Exam ?Triage Vital Signs ?ED Triage Vitals  ?Enc Vitals Group  ?   BP 06/05/21 1708 130/67  ?   Pulse Rate 06/05/21 1708 93  ?   Resp 06/05/21 1708 20  ?   Temp 06/05/21 1708 98.5 ?F (36.9 ?C)  ?   Temp Source 06/05/21 1708 Oral  ?   SpO2 06/05/21 1708 96 %  ?   Weight --   ?   Height --   ?   Head Circumference --   ?   Peak Flow --   ?   Pain Score 06/05/21 1709 9  ?   Pain Loc --   ?   Pain Edu? --   ?   Excl. in [place]? --   ? ?No data found. ? ?Updated Vital Signs ?BP 130/67 (BP Location: Right Arm)   Pulse 93   Temp 98.5 ?F (36.9 ?C) (Oral)   Resp 20   SpO2 96%  ? ?Visual Acuity ?Right Eye Distance:   ?Left Eye Distance:   ?Bilateral Distance:   ? ?Right Eye Near:   ?Left Eye Near:    ?Bilateral  Near:    ? ?Physical Exam ?Vitals and nursing note reviewed.  ?Constitutional:   ?   Appearance: Normal appearance.   ?Cardiovascular:  ?   Rate and Rhythm: Normal rate and regular rhythm.  ?   Pulses: Normal pulses.  ?   Heart sounds: Normal heart sounds.  ?Pulmonary:  ?   Effort: Pulmonary effort is normal. No respiratory distress.  ?   Breath sounds: Normal breath sounds. No stridor. No wheezing, rhonchi or rales.  ?Abdominal:  ?   General: Bowel sounds are normal.  ?   Palpations: Abdomen is soft.  ?Musculoskeletal:  ?   Thoracic back: Signs of trauma, spasms and tenderness present. Decreased range of motion.  ?     Back: ? ?   Comments: No bruising or deformity observed.  ?Neurological:  ?   Mental Status: She is alert.  ? ? ? ?UC Treatments / Results  ?Labs ?(all labs ordered are listed, but only abnormal results are displayed) ?Labs Reviewed - No data to display ? ?EKG ? ? ?Radiology ?DG Thoracic Spine 2 View ? ?Result Date: 06/05/2021 ?CLINICAL DATA:  Fall. EXAM: THORACIC SPINE 2 VIEWS COMPARISON:  None Available. FINDINGS: There is no evidence of thoracic spine fracture. Alignment is normal. No other significant bone abnormalities are identified. IMPRESSION: Negative. Electronically Signed   By: Ronney Asters M.D.   On: 06/05/2021 18:11   ? ?Procedures ?Procedures (including critical care time) ? ?Medications Ordered in UC ?Medications - No data to display ? ?Initial Impression / Assessment and Plan / UC Course  ?I have reviewed the triage vital signs and the nursing notes. ? ?Pertinent labs & imaging results that were available during my care of the patient were reviewed by me and considered in my medical decision making (see chart for details). ? ?The patient is a 66 year old female who presents with left-sided mid back pain after a fall off of a lawn more.  She states the area is tender to palpation.  On exam, there is no bruising present.  Her exam is reassuring for myofascial pain on the mechanism of injury.  Patient advised to apply ice to the affected area to help with pain and swelling.  Advised her to switch to  heat as needed for stiffness or spasm.  Patient advised to continue Tylenol.  Strict return precautions were provided to the patient.  Follow-up as needed. ?Final Clinical Impressions(s) / UC Diagnoses  ? ?Final diagnos

## 2021-06-05 NOTE — ED Triage Notes (Signed)
Pt reports pain in left shoulder and left flank pain x 1 day after she fell when from a lawn mowing the yard.  ?

## 2021-06-05 NOTE — Discharge Instructions (Addendum)
Your x-rays are negative. ?Symptoms are consistent with myofascial pain.  You may have a contusion to the area in which she fell. ?Apply ice to the affected area, apply for 20 minutes, remove for 1 hour, then repeat. ?Continue the Tylenol that you are currently taking for your symptoms. ?It may take 2 to 4 weeks for your symptoms to completely resolve.  If they take longer, follow-up with your primary care physician. ?

## 2021-07-15 ENCOUNTER — Other Ambulatory Visit: Payer: Self-pay | Admitting: Cardiology

## 2021-08-14 DIAGNOSIS — I2510 Atherosclerotic heart disease of native coronary artery without angina pectoris: Secondary | ICD-10-CM | POA: Diagnosis not present

## 2021-08-14 DIAGNOSIS — R7309 Other abnormal glucose: Secondary | ICD-10-CM | POA: Diagnosis not present

## 2021-08-14 DIAGNOSIS — I4891 Unspecified atrial fibrillation: Secondary | ICD-10-CM | POA: Diagnosis not present

## 2021-08-14 DIAGNOSIS — I351 Nonrheumatic aortic (valve) insufficiency: Secondary | ICD-10-CM | POA: Diagnosis not present

## 2021-08-14 DIAGNOSIS — E559 Vitamin D deficiency, unspecified: Secondary | ICD-10-CM | POA: Diagnosis not present

## 2021-08-14 DIAGNOSIS — Z0001 Encounter for general adult medical examination with abnormal findings: Secondary | ICD-10-CM | POA: Diagnosis not present

## 2021-08-14 DIAGNOSIS — E782 Mixed hyperlipidemia: Secondary | ICD-10-CM | POA: Diagnosis not present

## 2021-08-14 DIAGNOSIS — K219 Gastro-esophageal reflux disease without esophagitis: Secondary | ICD-10-CM | POA: Diagnosis not present

## 2021-08-14 DIAGNOSIS — E2740 Unspecified adrenocortical insufficiency: Secondary | ICD-10-CM | POA: Diagnosis not present

## 2021-08-14 DIAGNOSIS — E271 Primary adrenocortical insufficiency: Secondary | ICD-10-CM | POA: Diagnosis not present

## 2021-08-14 DIAGNOSIS — I10 Essential (primary) hypertension: Secondary | ICD-10-CM | POA: Diagnosis not present

## 2021-08-14 LAB — TSH: TSH: 2.96 (ref 0.41–5.90)

## 2021-08-19 ENCOUNTER — Other Ambulatory Visit: Payer: Self-pay | Admitting: Cardiology

## 2021-08-22 NOTE — Progress Notes (Signed)
Cardiology Office Note    Date:  08/23/2021   ID:  Debra Graves, DOB [birth year], MRN [id number]  PCP:  Redmond School, MD  Cardiologist:  Rozann Lesches, MD  Electrophysiologist:  None   Chief Complaint: diagnosed with afib  History of Present Illness:   Debra Graves is a 66 y.o. female with history of mild-moderate CAD by cath 2006, moderate AI by echo, HLD, anxiety, adrenal insufficiency, depression, fibroids, mild carotid/subclavian disease 2009, gtobacco abuse who is seen for follow-up. She had remote cath in 2006 with 30-40% mLAD, 60-70% oRCA (51% by IVUS) managed medically. Nuclear stress test 2008 showed no ischemia. Carotid duplex 2009 showed 0-49% R subclavian, 0-49% R bulb, normal L ICA. Lipids are followed by primary care. She was put on PRN Lasix in Jan 2022 for leg swelling. Last echo 05/2020 EF 60-65%, g1DD, mild calcification/thickening of AV, moderate AI, normal RA pressure.  She returns for follow-up overall doing well. She does report that an office visit last week for a work in visit, she was diagnosed with afib and started on Eliquis '5mg'$  BID. At the time of the visit we did not have these records or EKG. She is in NSR today. She had been seen for focal right sided jaw pain which she states she was told was potentially arthritis. She has not had any chest pain whatsoever, dyspnea, palpitations, breakthrough edema, dizziness, syncope or bleeding. 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 Initial BP 140/80 R arm, 138/80 L arm, recheck by me 120/62. She is still smoking but says she knows she needs to quit.  After the visit, we received the faxed records and I reviewed EKG with Dr. Lovena Le who felt this did represent atrial fibrillation.  Labwork independently reviewed: 08/15/21 faxed labs Hgb 14.3, plt 284, BUN 17, Cr 1.11, Na 144, K 4.0, LFTs ok, Tchol 177, trig 183, HDL 64, LDL 82, TSH wnl, cortisol 4.8 (L - obtained by PCP), A1c 5.4 07/2019 Hgb 14.1, plt 294, Cr  1.02, K 4.5, LFTs ok, A1C 5.7, LDL 76, TSH wnl   Cardiology Studies:   Studies reviewed are outlined and summarized above. Reports included below if pertinent.   2D Echo 05/2020   1. Left ventricular ejection fraction, by estimation, is 60 to 65%. The  left ventricle has normal function. The left ventricle has no regional  wall motion abnormalities. Left ventricular diastolic parameters are  consistent with Grade I diastolic  dysfunction (impaired relaxation).   2. Right ventricular systolic function is normal. The right ventricular  size is normal.   3. The mitral valve is normal in structure. No evidence of mitral valve  regurgitation. No evidence of mitral stenosis.   4. The aortic valve is tricuspid. There is mild calcification of the  aortic valve. There is mild thickening of the aortic valve. Aortic valve  regurgitation is moderate.   5. The inferior vena cava is normal in size with greater than 50%  respiratory variability, suggesting right atrial pressure of 3 mmHg.   Carotid duplex 2009 - 0-49% R subclavian, 0-49% R bulb, normal L ICA.  Nuc 2008-  low risk/no ischemia  Cath 2006 - scanned - 30-40% mLAD, 60-70% oRCA (51% by IVUS)    Past Medical History:  Diagnosis Date   Adrenal insufficiency ([facility])    Anxiety    Aortic insufficiency    Arthritis    Back pain    CAD (coronary artery disease)    Moderate ostial RCA disease at  cardiac catheterization 2006   Constipation    Depression    Fibroids    Hypercholesteremia    Mild carotid artery disease ([facility])    Rectocele     Past Surgical History:  Procedure Laterality Date   CARDIAC CATHETERIZATION  03/08/2004   mild progressive CAD   carotid duplex  03/09/2007   rt subclavian 0 to 49%,right bulb 0 to 49 %,lf ICA normal   COLONOSCOPY N/A 06/16/2013   Dr. Gala Romney: hyperplastic polyps, melanosis coli    DOPPLER ECHOCARDIOGRAPHY  03/20/2010   EF >55 %,trace mitral regurg,mild tricuspid regurg,aortic valve mildly  sclerotic'mild to moderate aortic regurg,trace pulmonic valver regurg   NM MYOCAR PERF WALL MOTION  03/19/06   EF 61%,low risk study    Current Medications: Current Meds  Medication Sig   aspirin 81 MG tablet Take 81 mg by mouth daily.   clonazePAM (KLONOPIN) 0.5 MG tablet Take 0.5 mg by mouth daily.   Docusate Calcium (STOOL SOFTENER PO) Take 2 tablets by mouth daily.   ELIQUIS 5 MG TABS tablet Take 5 mg by mouth 2 (two) times daily.   escitalopram (LEXAPRO) 20 MG tablet Take 20 mg by mouth daily.   esomeprazole (NEXIUM) 20 MG capsule TAKE (1) CAPSULE BY MOUTH TWICE DAILY BEFORE MEALS.   fish oil-omega-3 fatty acids 1000 MG capsule Take 2 g by mouth daily.   furosemide (LASIX) 20 MG tablet Take 20 mg by mouth 2 (two) times daily.   hydrocortisone (CORTEF) 10 MG tablet Take 10 mg by mouth 2 (two) times daily.   LORazepam (ATIVAN) 1 MG tablet Take 1 mg by mouth every 6 (six) hours as needed for anxiety.   meloxicam (MOBIC) 7.5 MG tablet Take 7.5 mg by mouth 2 (two) times daily.   Multiple Vitamin (MULTIVITAMIN) capsule Take 1 capsule by mouth daily. Reported on 03/29/2015   rosuvastatin (CRESTOR) 20 MG tablet TAKE (1) TABLET BY MOUTH AT BEDTIME.   Vitamin D, Ergocalciferol, 2000 units CAPS Take 1 capsule by mouth daily.     Allergies:   Albuterol, Bactrim [sulfamethoxazole-trimethoprim], and Vytorin [ezetimibe-simvastatin]   Social History   Socioeconomic History   Marital status: Divorced    Spouse name: Not on file   Number of children: Not on file   Years of education: Not on file   Highest education level: Not on file  Occupational History   Occupation: disability    Employer: UNEMPLOYED  Tobacco Use   Smoking status: Former    Packs/day: 0.50    Years: 41.00    Total pack years: 20.50    Types: Cigarettes   Smokeless tobacco: Never  Vaping Use   Vaping Use: Never used  Substance and Sexual Activity   Alcohol use: No   Drug use: No   Sexual activity: Not Currently     Birth control/protection: None, Post-menopausal    Comment: once in 9 months  Other Topics Concern   Not on file  Social History Narrative   Drinks 3 cups of coffee daily.   Social Determinants of Health   Financial Resource Strain: Not on file  Food Insecurity: Not on file  Transportation Needs: Not on file  Physical Activity: Not on file  Stress: Not on file  Social Connections: Not on file     Family History:  The patient's family history includes Breast cancer (age of onset: 33) in her sister; Cancer (age of onset: 4) in her mother; Coronary artery disease in her brother, brother, brother, and father;  Heart disease in her father and sister; Hypertension in her sister; Stroke in her brother and father. There is no history of Colon cancer.  ROS:   Please see the history of present illness.  All other systems are reviewed and otherwise negative.    EKG(s)/Additional Labs   EKG:  EKG is ordered today, personally reviewed, demonstrating NSR 90bpm, left axis deviation, poor R wave progression. No acute changes.  Recent Labs: No results found for requested labs within last 365 days.  Recent Lipid Panel    Component Value Date/Time   CHOL 166 04/09/2015 0957   TRIG 79 04/09/2015 0957   HDL 76 04/09/2015 0957   CHOLHDL 2.6 03/20/2014 0851   VLDL 23 03/20/2014 0851   LDLCALC 74 04/09/2015 0957    PHYSICAL EXAM:    VS:  BP 138/60 (BP Location: Left Arm, Patient Position: Sitting)   Pulse 90   Ht '5\' 6"'$  (1.676 m)   Wt 227 lb (103 kg)   SpO2 97%   BMI 36.64 kg/m   BMI: Body mass index is 36.64 kg/m.  GEN: Well nourished, well developed female in no acute distress HEENT: normocephalic, atraumatic Neck: no JVD, carotid bruits, or masses Cardiac: RRR; no murmurs, rubs, or gallops, no edema  Respiratory:  clear to auscultation bilaterally, normal work of breathing GI: soft, nontender, nondistended, + BS MS: no deformity or atrophy Skin: warm and dry, no rash Neuro:   Alert and Oriented x 3, Strength and sensation are intact, follows commands Psych: euthymic mood, full affect  Wt Readings from Last 3 Encounters:  08/23/21 227 lb (103 kg)  05/29/20 223 lb 12.8 oz (101.5 kg)  05/14/20 221 lb 9.6 oz (100.5 kg)     ASSESSMENT & PLAN:   1. CAD, HLD goal LDL <70 - doing well without recent angina. Had day of focal right sided jaw discomfort but reports h/o TMJ and states she was told this may be arthritis. 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 This A/P was completed after we received the PCP faxed records which confirmed afib. I will reach out to Dr. Domenic Polite to find out if she can stop her ASA. Recent LDL by PCP labs was 82, above goal. Will recommend in phone note to increase rosuvastatin to '40mg'$  daily with recheck LFTs/lipids in 6 weeks fasting.   2. Paroxysmal atrial fibrillation - EKG from PCP's office was reviewed post-visit showed atrial fib 114bpm with nonspecific STTW changes (from 08/14/21 visit). Reviewed with Dr. Lovena Le who feels this does indeed reflect atrial fib. She was I NSR today. Phone note placed to patient to relay this update to her. Plan 2 week non-live Zio to assess Afib burden and start Toprol XL '25mg'$  daily. Continue Eliquis as prescribed by PCP. Already had updated labs otherwise above from primary care. Will also update echo - had already planned to get this for her AI anyway.  3. Moderate AI - due for repeat echo, will arrange.  5. Mild carotid/subclavian artery disease - BP near equal in both arms. Initial BP mildly elevated but patient was anxious about finding the office. Recheck by me was normal. Given ongoing tobacco use and duration since last study, will repeat duplex. If unchanged, anticipate following only PRN going forward. If there has been any progression, can decide how often to follow in follow-up.    Disposition: F/u with Dr. Domenic Polite as  previously planned in 10/2021.   Medication Adjustments/Labs and Tests Ordered: Current medicines are reviewed at length with the patient today.  Concerns regarding medicines are outlined above. Medication changes, Labs and Tests ordered today are summarized above and listed in the Patient Instructions accessible in Encounters.   Signed, Charlie Pitter, PA-C  08/23/2021 3:09 PM    [place] Phone: [phone]; Fax: [phone]

## 2021-08-23 ENCOUNTER — Encounter: Payer: Self-pay | Admitting: Physician Assistant

## 2021-08-23 ENCOUNTER — Ambulatory Visit (INDEPENDENT_AMBULATORY_CARE_PROVIDER_SITE_OTHER): Payer: Medicare Other | Admitting: Physician Assistant

## 2021-08-23 ENCOUNTER — Telehealth: Payer: Self-pay | Admitting: Physician Assistant

## 2021-08-23 VITALS — BP 120/62 | HR 90 | Ht 66.0 in | Wt 227.0 lb

## 2021-08-23 DIAGNOSIS — E785 Hyperlipidemia, unspecified: Secondary | ICD-10-CM | POA: Diagnosis not present

## 2021-08-23 DIAGNOSIS — I4891 Unspecified atrial fibrillation: Secondary | ICD-10-CM

## 2021-08-23 DIAGNOSIS — I6529 Occlusion and stenosis of unspecified carotid artery: Secondary | ICD-10-CM | POA: Diagnosis not present

## 2021-08-23 DIAGNOSIS — I351 Nonrheumatic aortic (valve) insufficiency: Secondary | ICD-10-CM | POA: Diagnosis not present

## 2021-08-23 DIAGNOSIS — I2510 Atherosclerotic heart disease of native coronary artery without angina pectoris: Secondary | ICD-10-CM

## 2021-08-23 NOTE — Telephone Encounter (Addendum)
Tee: Please let pt know that we received the fax from her PCP's office. I reviewed EKG from PCP with Dr. Lovena Le who agrees this is likely atrial fibrillation and agrees with anticoagulation. I tried to call patient but each number rang and rang. Her home number went to a VM that was full. The mobile did not go to a VM. Please try to call patient again on Monday and let her know that we did confirm with Dr. Lovena Le that they saw atrial fib on her EKG. I would like to set her up with a 2 week non live Zio to assess her afib burden. Continue Eliquis as prescribed by PCP. This is to help with prevention of stroke from atrial fibrillation. Remind her that if she notices any bleeding such as blood in stool, black tarry stools, blood in urine, nosebleeds or any other unusual bleeding, to call her doctor immediately. It is not normal to have this kind of bleeding while on a blood thinner and usually indicates there is an underlying problem with one of the body systems that needs to be checked out. Would also add Toprol XL '25mg'$  daily and increase rosuvastatin to '40mg'$  daily with recheck LFTs/lipids in 6 weeks.  I will route to Dr. Domenic Polite to make him aware patient has been diagnosed with afib to find out if he would suggest continuing or discontinuing patient's baby ASA. Has hx of remote cath in 2006 with 30-40% mLAD, 60-70% oRCA (51% by IVUS)  and mild nonobstructive carotid disease in 2009. No recent angina. Thank you!  Charlie Pitter, PA-C

## 2021-08-23 NOTE — Patient Instructions (Signed)
Medication Instructions:  Your physician recommends that you continue on your current medications as directed. Please refer to the Current Medication list given to you today. *If you need a refill on your cardiac medications before your next appointment, please call your pharmacy*   Lab Work: None Ordered   Testing/Procedures: Your physician has requested that you have an echocardiogram. Echocardiography is a painless test that uses sound waves to create images of your heart. It provides your doctor with information about the size and shape of your heart and how well your heart's chambers and valves are working. This procedure takes approximately one hour. There are no restrictions for this procedure.  Your physician has requested that you have a carotid duplex. This test is an ultrasound of the carotid arteries in your neck. It looks at blood flow through these arteries that supply the brain with blood. Allow one hour for this exam. There are no restrictions or special instructions.   Follow-Up: At [facility], you and your health needs are our priority.  As part of our continuing mission to provide you with exceptional heart care, we have created designated Provider Care Teams.  These Care Teams include your primary Cardiologist (physician) and Advanced Practice Providers (APPs -  Physician Assistants and Nurse Practitioners) who all work together to provide you with the care you need, when you need it.  We recommend signing up for the patient portal called "MyChart".  Sign up information is provided on this After Visit Summary.  MyChart is used to connect with patients for Virtual Visits (Telemedicine).  Patients are able to view lab/test results, encounter notes, upcoming appointments, etc.  Non-urgent messages can be sent to your provider as well.   To learn more about what you can do with MyChart, go to [email or web address].    Your next appointment:   Follow as scheduled  The  format for your next appointment:   In Person  Provider:   Rozann Lesches, MD    Other Instructions   Important Information About Sugar

## 2021-08-25 NOTE — Telephone Encounter (Signed)
Tee - please also make sure pt is made aware to stop aspirin since she is now on Eliquis, per review with Dr. Domenic Polite. Thank you.

## 2021-08-26 ENCOUNTER — Ambulatory Visit (INDEPENDENT_AMBULATORY_CARE_PROVIDER_SITE_OTHER): Payer: Medicare Other

## 2021-08-26 DIAGNOSIS — E785 Hyperlipidemia, unspecified: Secondary | ICD-10-CM

## 2021-08-26 DIAGNOSIS — I4891 Unspecified atrial fibrillation: Secondary | ICD-10-CM

## 2021-08-26 MED ORDER — ROSUVASTATIN CALCIUM 40 MG PO TABS: 40.0000 mg | ORAL_TABLET | Freq: Every day | ORAL | 1 refills | Status: DC

## 2021-08-26 MED ORDER — METOPROLOL SUCCINATE ER 25 MG PO TB24: 25.0000 mg | ORAL_TABLET | Freq: Every day | ORAL | 3 refills | Status: DC

## 2021-08-26 NOTE — Telephone Encounter (Signed)
I spoke with patient and she had already taken her ASA today but will stop.  I have d/c'd ASA from her medication list.

## 2021-08-26 NOTE — Addendum Note (Signed)
Addended by: Barbarann Ehlers A on: 08/26/2021 10:23 AM   Modules accepted: Orders

## 2021-08-26 NOTE — Progress Notes (Unsigned)
Enrolled for [employer] to mail a ZIO XT long term holter monitor to the patients address on file.   Dr. Sam McDowell to read. 

## 2021-08-26 NOTE — Telephone Encounter (Signed)
Spoke with the patient and gave her Debra Graves's recommendations. She voiced understanding.   Offer to schedule the pateint a lab appointment but she declined and stated she will complete her labs at [facility]. I advised the patient to have her labs completed around September 30, 2021.   Rx(s) sent to pharmacy electronically.  Order placed for zio monitor. I informed the patient that our monitor lady would mail her a monitor and if she needed assistance with the monitor she could call the number on the box or the [place] office to help her put on the monitor.   She voiced understanding.

## 2021-08-27 ENCOUNTER — Ambulatory Visit (HOSPITAL_COMMUNITY)
Admission: RE | Admit: 2021-08-27 | Discharge: 2021-08-27 | Disposition: A | Discharge status: Home / Self Care | Payer: Medicare Other | Source: Ambulatory Visit | Attending: Physician Assistant | Admitting: Physician Assistant

## 2021-08-27 DIAGNOSIS — I4891 Unspecified atrial fibrillation: Secondary | ICD-10-CM | POA: Insufficient documentation

## 2021-08-27 DIAGNOSIS — I351 Nonrheumatic aortic (valve) insufficiency: Secondary | ICD-10-CM | POA: Diagnosis not present

## 2021-08-27 DIAGNOSIS — I6529 Occlusion and stenosis of unspecified carotid artery: Secondary | ICD-10-CM | POA: Diagnosis not present

## 2021-08-27 DIAGNOSIS — I2510 Atherosclerotic heart disease of native coronary artery without angina pectoris: Secondary | ICD-10-CM | POA: Diagnosis not present

## 2021-08-27 DIAGNOSIS — E785 Hyperlipidemia, unspecified: Secondary | ICD-10-CM | POA: Insufficient documentation

## 2021-08-27 LAB — ECHOCARDIOGRAM COMPLETE
AR max vel: 2.05 cm2
AV Area VTI: 1.91 cm2
AV Area mean vel: 2.07 cm2
AV Mean grad: 6 mmHg
AV Peak grad: 11.8 mmHg
Ao pk vel: 1.72 m/s
Area-P 1/2: 3.37 cm2
P 1/2 time: 516 msec
S' Lateral: 3.4 cm

## 2021-08-27 NOTE — Progress Notes (Signed)
*  PRELIMINARY RESULTS* Echocardiogram 2D Echocardiogram has been performed.  Debra Graves 08/27/2021, 11:11 AM

## 2021-08-29 DIAGNOSIS — I4891 Unspecified atrial fibrillation: Secondary | ICD-10-CM | POA: Diagnosis not present

## 2021-09-02 ENCOUNTER — Ambulatory Visit (HOSPITAL_COMMUNITY)
Admission: RE | Admit: 2021-09-02 | Discharge: 2021-09-02 | Disposition: A | Discharge status: Home / Self Care | Payer: Medicare Other | Source: Ambulatory Visit | Attending: Physician Assistant | Admitting: Physician Assistant

## 2021-09-02 DIAGNOSIS — I2510 Atherosclerotic heart disease of native coronary artery without angina pectoris: Secondary | ICD-10-CM | POA: Diagnosis not present

## 2021-09-02 DIAGNOSIS — I4891 Unspecified atrial fibrillation: Secondary | ICD-10-CM | POA: Insufficient documentation

## 2021-09-02 DIAGNOSIS — I6523 Occlusion and stenosis of bilateral carotid arteries: Secondary | ICD-10-CM | POA: Diagnosis not present

## 2021-09-02 DIAGNOSIS — E785 Hyperlipidemia, unspecified: Secondary | ICD-10-CM | POA: Insufficient documentation

## 2021-09-02 DIAGNOSIS — I6529 Occlusion and stenosis of unspecified carotid artery: Secondary | ICD-10-CM | POA: Diagnosis not present

## 2021-09-02 DIAGNOSIS — I351 Nonrheumatic aortic (valve) insufficiency: Secondary | ICD-10-CM | POA: Diagnosis not present

## 2021-09-17 ENCOUNTER — Telehealth: Payer: Self-pay

## 2021-09-17 NOTE — Telephone Encounter (Signed)
Patient notified and verbalized understanding. Patient had no questions or concerns at this time. PCP copied 

## 2021-09-17 NOTE — Telephone Encounter (Signed)
-----   Message from Charlie Pitter, [place] sent at 09/17/2021  7:43 AM EDT ----- Please let patient know that monitor showed predominantly NSR but some breakthrough atrial fib - though low burden overall 4% of the time which is good. This still affirms the plan for metoprolol and Eliquis. If she becomes symptomatic with her afib let us know but otherwise continue plan as discussed at recent [place].

## 2021-11-07 ENCOUNTER — Ambulatory Visit: Payer: Medicare Other | Attending: Cardiology | Admitting: Cardiology

## 2021-11-07 ENCOUNTER — Encounter: Payer: Self-pay | Admitting: Cardiology

## 2021-11-07 VITALS — BP 140/72 | HR 93 | Ht 66.0 in | Wt 225.6 lb

## 2021-11-07 DIAGNOSIS — I6523 Occlusion and stenosis of bilateral carotid arteries: Secondary | ICD-10-CM | POA: Diagnosis not present

## 2021-11-07 DIAGNOSIS — I480 Paroxysmal atrial fibrillation: Secondary | ICD-10-CM | POA: Diagnosis not present

## 2021-11-07 DIAGNOSIS — E782 Mixed hyperlipidemia: Secondary | ICD-10-CM

## 2021-11-07 DIAGNOSIS — I25119 Atherosclerotic heart disease of native coronary artery with unspecified angina pectoris: Secondary | ICD-10-CM | POA: Diagnosis not present

## 2021-11-07 NOTE — Patient Instructions (Signed)
Medication Instructions:  Your physician recommends that you continue on your current medications as directed. Please refer to the Current Medication list given to you today.   Labwork: None today  Testing/Procedures: None today  Follow-Up: 6 months  Any Other Special Instructions Will Be Listed Below (If Applicable).  If you need a refill on your cardiac medications before your next appointment, please call your pharmacy.  

## 2021-11-07 NOTE — Progress Notes (Signed)
Cardiology Office Note  Date: 11/07/2021   ID: Debra Graves, DOB 10/19/1955, MRN [id number]  PCP:  Redmond School, MD  Cardiologist:  Rozann Lesches, MD Electrophysiologist:  None   Chief Complaint  Patient presents with   Cardiac follow-up    History of Present Illness: Debra Graves is a 66 y.o. female last seen in August by Ms. Dunn PA-C, I reviewed the note (I have not seen her since 2020).  She is here for a routine visit.  Reports no chest pain or sense of palpitations.  NYHA class II dyspnea.  We went over her medications which are outlined below, discussed dosing and compliance.  She does not report any spontaneous bleeding problems on Eliquis.  I did review her lab work done in the interim.  She is tolerating increased dose Crestor and I recommended that she follow-up with Dr. Gerarda Fraction for a repeat lipid panel.  I reviewed her echocardiogram, cardiac monitor and carotid Doppler results noted below.  Past Medical History:  Diagnosis Date   Adrenal insufficiency (HCC)    Anxiety    Aortic insufficiency    Arthritis    Back pain    CAD (coronary artery disease)    Moderate ostial RCA disease at cardiac catheterization 2006   Constipation    Depression    Fibroids    Hypercholesteremia    Mild carotid artery disease (HCC)    Rectocele     Past Surgical History:  Procedure Laterality Date   CARDIAC CATHETERIZATION  03/08/2004   mild progressive CAD   carotid duplex  03/09/2007   rt subclavian 0 to 49%,right bulb 0 to 49 %,lf ICA normal   COLONOSCOPY N/A 06/16/2013   Dr. Gala Romney: hyperplastic polyps, melanosis coli    DOPPLER ECHOCARDIOGRAPHY  03/20/2010   EF >55 %,trace mitral regurg,mild tricuspid regurg,aortic valve mildly sclerotic'mild to moderate aortic regurg,trace pulmonic valver regurg   NM MYOCAR PERF WALL MOTION  03/19/06   EF 61%,low risk study    Current Outpatient Medications  Medication Sig Dispense Refill   clonazePAM (KLONOPIN) 0.5 MG tablet  Take 0.5 mg by mouth daily.     Docusate Calcium (STOOL SOFTENER PO) Take 2 tablets by mouth daily.     ELIQUIS 5 MG TABS tablet Take 5 mg by mouth 2 (two) times daily.     escitalopram (LEXAPRO) 20 MG tablet Take 20 mg by mouth daily.     esomeprazole (NEXIUM) 20 MG capsule TAKE (1) CAPSULE BY MOUTH TWICE DAILY BEFORE MEALS. 180 capsule 3   fish oil-omega-3 fatty acids 1000 MG capsule Take 2 g by mouth daily.     furosemide (LASIX) 20 MG tablet Take 20 mg by mouth 2 (two) times daily.     hydrocortisone (CORTEF) 10 MG tablet Take 10 mg by mouth 2 (two) times daily.     LORazepam (ATIVAN) 1 MG tablet Take 1 mg by mouth every 6 (six) hours as needed for anxiety.     meloxicam (MOBIC) 7.5 MG tablet Take 7.5 mg by mouth 2 (two) times daily.     metoprolol succinate (TOPROL XL) 25 MG 24 hr tablet Take 1 tablet (25 mg total) by mouth daily. 30 tablet 3   Multiple Vitamin (MULTIVITAMIN) capsule Take 1 capsule by mouth daily. Reported on 03/29/2015     rosuvastatin (CRESTOR) 40 MG tablet Take 1 tablet (40 mg total) by mouth daily. 90 tablet 1   Vitamin D, Ergocalciferol, 2000 units CAPS Take 1 capsule by mouth  daily.     No current facility-administered medications for this visit.   Allergies:  Albuterol, Bactrim [sulfamethoxazole-trimethoprim], and Vytorin [ezetimibe-simvastatin]   ROS: Chronic back pain.  No falls or syncope.  Physical Exam: VS:  BP (!) 140/72   Pulse 93   Ht '5\' 6"'$  (1.676 m)   Wt 225 lb 9.6 oz (102.3 kg)   SpO2 96%   BMI 36.41 kg/m , BMI Body mass index is 36.41 kg/m.  Wt Readings from Last 3 Encounters:  11/07/21 225 lb 9.6 oz (102.3 kg)  08/23/21 227 lb (103 kg)  05/29/20 223 lb 12.8 oz (101.5 kg)    General: Patient appears comfortable at rest. HEENT: Conjunctiva and lids normal. Neck: Supple, no elevated JVP or carotid bruits. Lungs: Clear to auscultation, nonlabored breathing at rest. Cardiac: Regular rate and rhythm, no S3 or significant systolic  murmur. Extremities: No pitting edema.  ECG:  An ECG dated 08/23/2021 was personally reviewed today and demonstrated:  Sinus rhythm with leftward axis.  Recent Labwork:  July 2023: Hemoglobin 14.3, platelets 284, BUN 17, creatinine 1.11, potassium 4.0, AST 17, ALT 15, cholesterol 177, triglycerides 183, HDL 64, LDL 82, TSH 2.96, hemoglobin A1c 5.4%  Other Studies Reviewed Today:  Echocardiogram 08/27/2021:  1. Left ventricular ejection fraction, by estimation, is 60 to 65%. The  left ventricle has normal function. The left ventricle has no regional  wall motion abnormalities. Left ventricular diastolic parameters are  consistent with Grade I diastolic  dysfunction (impaired relaxation).   2. Right ventricular systolic function is normal. The right ventricular  size is normal. Tricuspid regurgitation signal is inadequate for assessing  PA pressure.   3. The mitral valve is grossly normal. No evidence of mitral valve  regurgitation.   4. Aortic valve regurgitation is moderate. Aortic valve  sclerosis/calcification is present, without any evidence of aortic  stenosis.   5. The inferior vena cava is normal in size with greater than 50%  respiratory variability, suggesting right atrial pressure of 3 mmHg.   Carotid Dopplers 09/02/2021: MPRESSION: Color duplex indicates minimal heterogeneous and calcified plaque, with no hemodynamically significant stenosis by duplex criteria in the extracranial cerebrovascular circulation.  Cardiac monitor August 2023: ZIO XT reviewed.  5 days, 21 hours analyzed.   Predominant rhythm is sinus with heart rate ranging from 56 bpm up to 109 bpm and average heart rate 69 bpm. Paroxysmal atrial fibrillation was documented, overall rhythm burden of only 4% however.  Average heart rate during events was 98 bpm with limited rapid ventricular response. There were rare PACs including atrial couplets and triplets representing less than 1% total beats. There were  rare PVCs representing less than 1% total beats. No pauses.  Assessment and Plan:  1.  Paroxysmal atrial fibrillation with CHA2DS2-VASc score of 3-4.  Plan to continue Toprol-XL and Eliquis.  She reports no significant palpitations at this time.  2.  Moderate CAD being managed medically in the absence of angina symptoms.  No longer on aspirin given use of Eliquis.  Continue Crestor.  3.  Mixed hyperlipidemia, Crestor increased to 40 mg daily back in August.  Recommend that she follow-up with PCP for repeat lipid panel.  4.  Moderate aortic regurgitation, stable by follow-up echocardiogram in August with no LV dilatation and LVEF 60 to 65%.  5.  Mild carotid artery atherosclerosis.  Asymptomatic.  Continue statin therapy.  Medication Adjustments/Labs and Tests Ordered: Current medicines are reviewed at length with the patient today.  Concerns regarding medicines are  outlined above.   Tests Ordered: No orders of the defined types were placed in this encounter.   Medication Changes: No orders of the defined types were placed in this encounter.   Disposition:  Follow up  6 months.  Signed, Satira Sark, MD, [facility] 11/07/2021 2:26 PM    [place] [facility] [address] [place], [place] [address] Phone: [phone]; Fax: [phone]

## 2021-11-21 DIAGNOSIS — E7849 Other hyperlipidemia: Secondary | ICD-10-CM | POA: Diagnosis not present

## 2021-11-21 DIAGNOSIS — M159 Polyosteoarthritis, unspecified: Secondary | ICD-10-CM | POA: Diagnosis not present

## 2021-11-21 DIAGNOSIS — Z23 Encounter for immunization: Secondary | ICD-10-CM | POA: Diagnosis not present

## 2021-11-21 DIAGNOSIS — E2740 Unspecified adrenocortical insufficiency: Secondary | ICD-10-CM | POA: Diagnosis not present

## 2021-11-21 DIAGNOSIS — I4891 Unspecified atrial fibrillation: Secondary | ICD-10-CM | POA: Diagnosis not present

## 2021-11-21 DIAGNOSIS — Z0001 Encounter for general adult medical examination with abnormal findings: Secondary | ICD-10-CM | POA: Diagnosis not present

## 2021-11-21 DIAGNOSIS — I10 Essential (primary) hypertension: Secondary | ICD-10-CM | POA: Diagnosis not present

## 2021-12-03 DIAGNOSIS — S92504A Nondisplaced unspecified fracture of right lesser toe(s), initial encounter for closed fracture: Secondary | ICD-10-CM | POA: Diagnosis not present

## 2021-12-10 ENCOUNTER — Other Ambulatory Visit: Payer: Self-pay | Admitting: Physician Assistant

## 2021-12-25 ENCOUNTER — Other Ambulatory Visit (HOSPITAL_COMMUNITY): Payer: Self-pay | Admitting: Internal Medicine

## 2021-12-25 DIAGNOSIS — Z1231 Encounter for screening mammogram for malignant neoplasm of breast: Secondary | ICD-10-CM

## 2022-01-03 ENCOUNTER — Other Ambulatory Visit: Payer: Self-pay | Admitting: Physician Assistant

## 2022-01-09 ENCOUNTER — Ambulatory Visit (HOSPITAL_COMMUNITY)
Admission: RE | Admit: 2022-01-09 | Discharge: 2022-01-09 | Disposition: A | Discharge status: Home / Self Care | Payer: Medicare Other | Source: Ambulatory Visit | Attending: Internal Medicine | Admitting: Internal Medicine

## 2022-01-09 DIAGNOSIS — Z1231 Encounter for screening mammogram for malignant neoplasm of breast: Secondary | ICD-10-CM | POA: Insufficient documentation

## 2022-03-13 DIAGNOSIS — E271 Primary adrenocortical insufficiency: Secondary | ICD-10-CM | POA: Diagnosis not present

## 2022-03-13 DIAGNOSIS — I10 Essential (primary) hypertension: Secondary | ICD-10-CM | POA: Diagnosis not present

## 2022-03-13 DIAGNOSIS — E2740 Unspecified adrenocortical insufficiency: Secondary | ICD-10-CM | POA: Diagnosis not present

## 2022-04-03 ENCOUNTER — Encounter: Payer: Self-pay | Admitting: Gastroenterology

## 2022-05-07 IMAGING — MG MM DIGITAL SCREENING BILAT W/ TOMO AND CAD
6 of 10 series · 6 of 30 positions shown · non-contrast
Comparison: Previous exam(s).

CLINICAL DATA: Screening.

EXAM:
DIGITAL SCREENING BILATERAL MAMMOGRAM WITH TOMOSYNTHESIS AND CAD
TECHNIQUE: Bilateral screening digital craniocaudal and mediolateral oblique
mammograms were obtained. Bilateral screening digital breast
tomosynthesis was performed. The images were evaluated with
computer-aided detection.

[L CC synth-2D (1 of 2)]
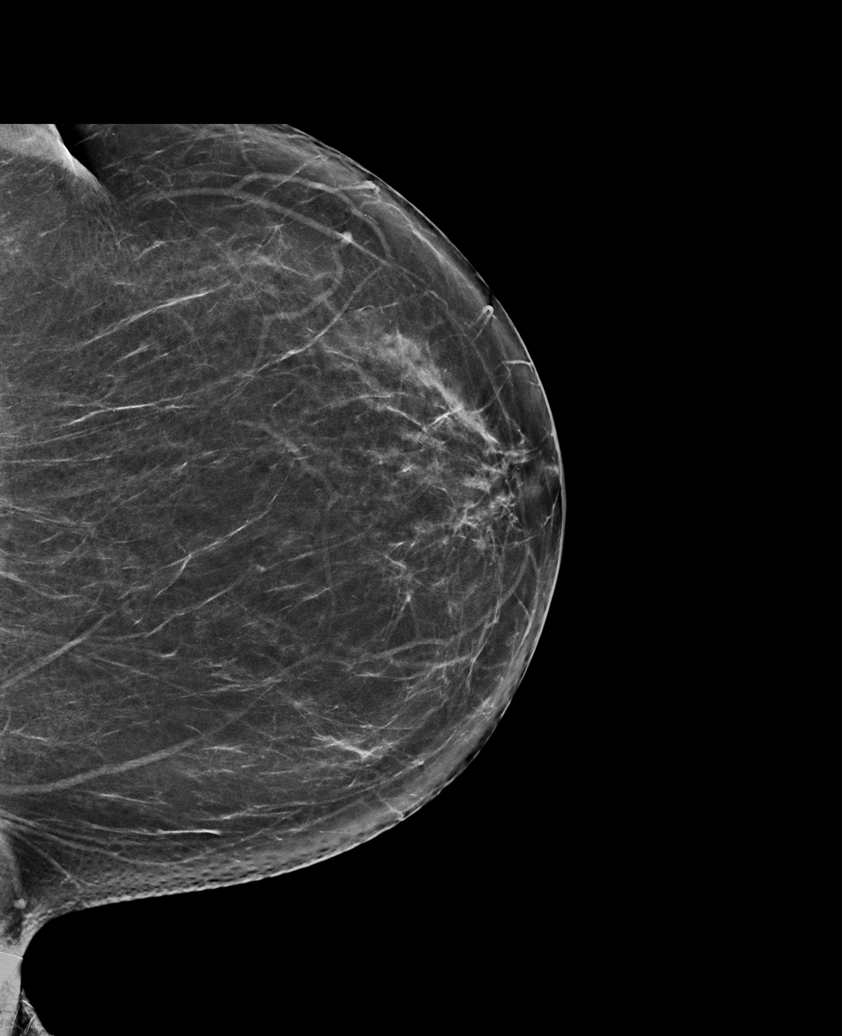

[L CC synth-2D (2 of 2)]
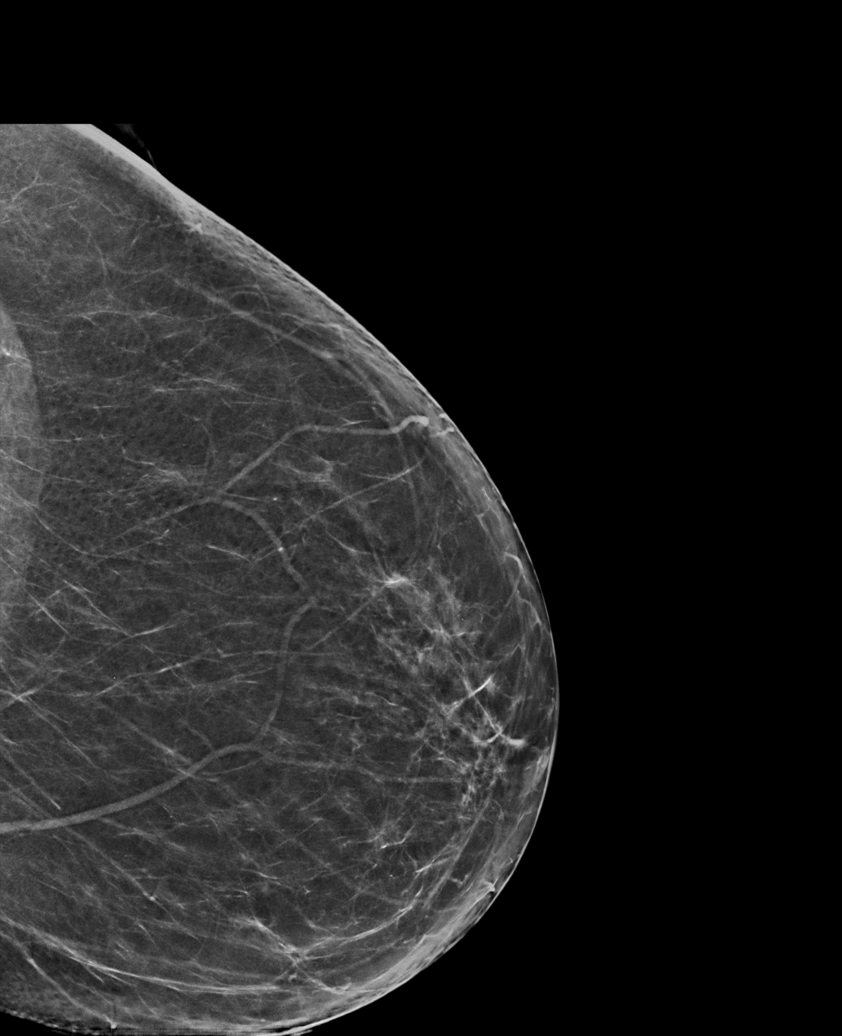

[L MLO synth-2D]
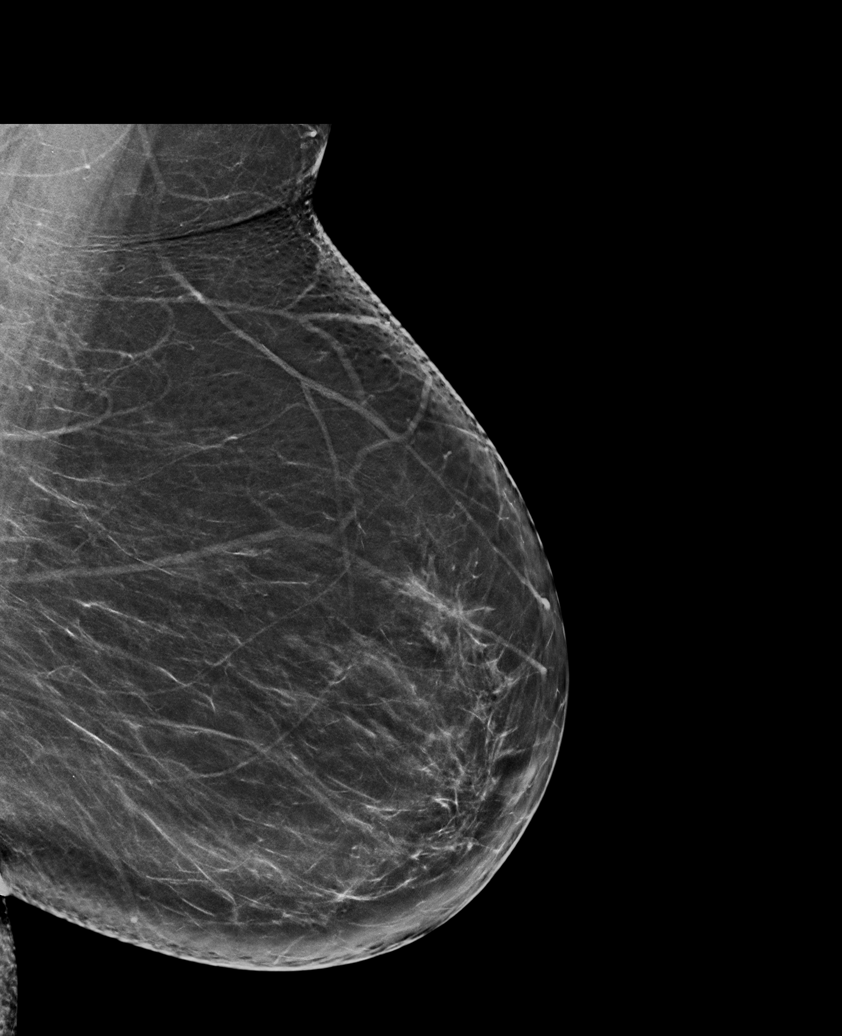

[R MLO synth-2D]
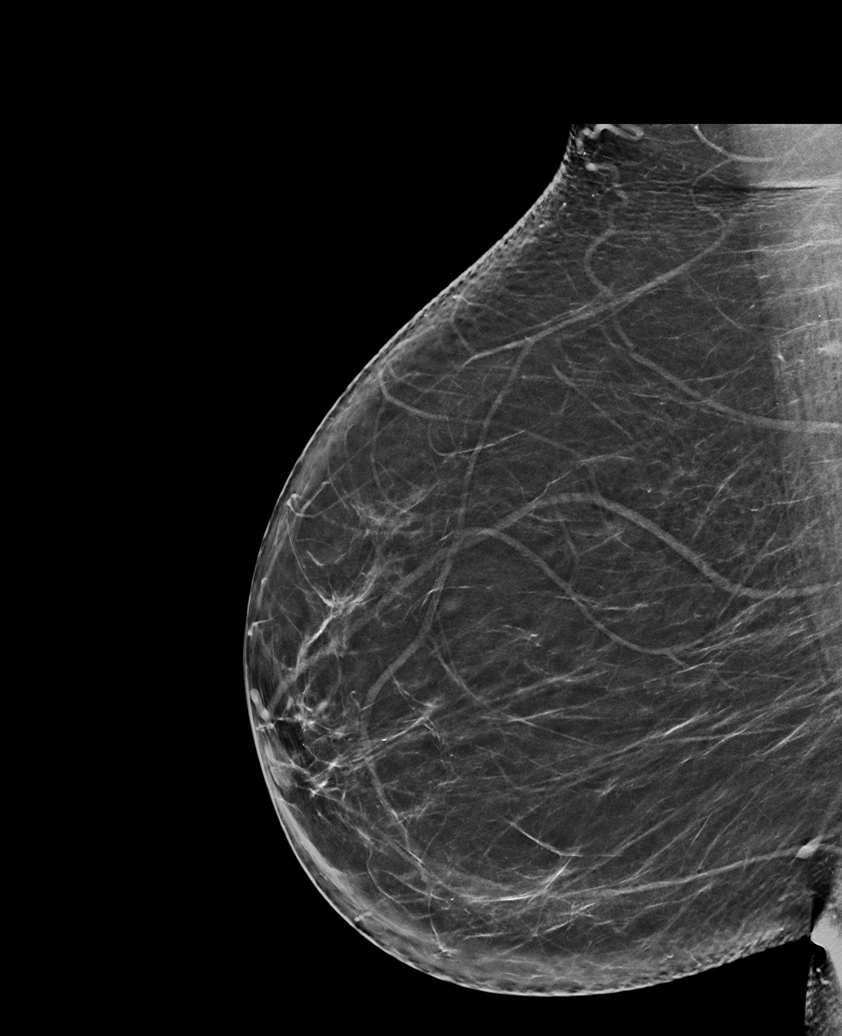

[R CC synth-2D]
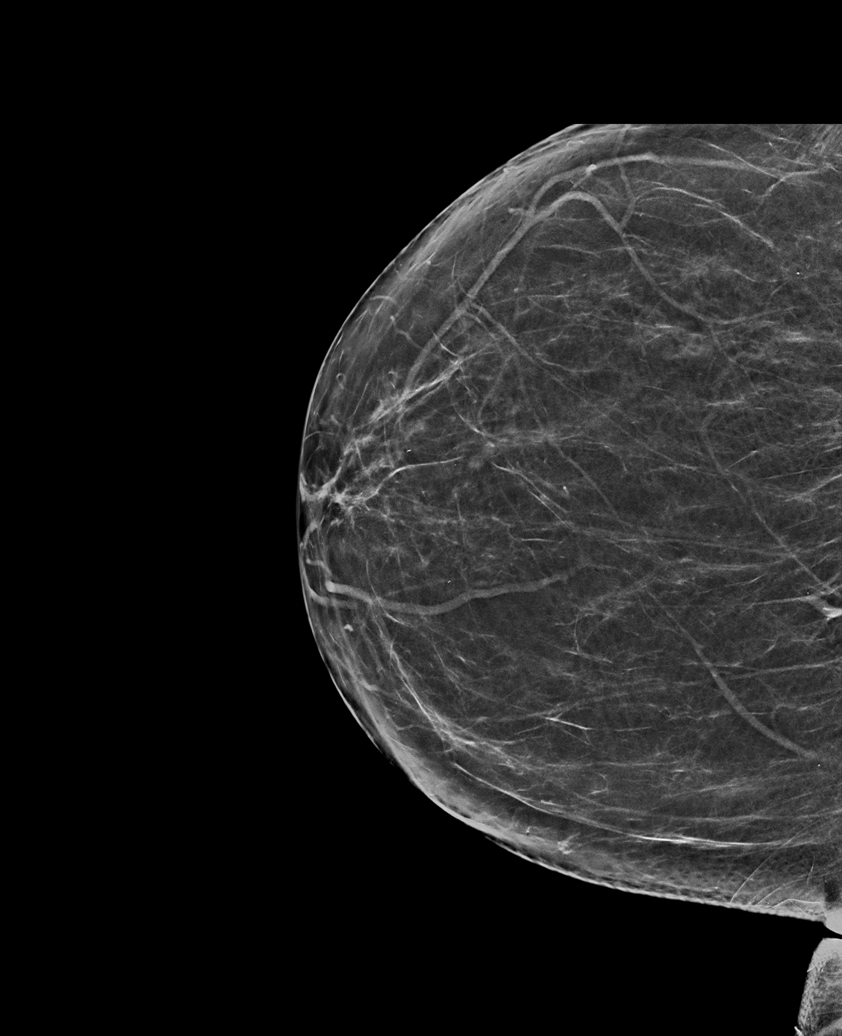

[L CC tomo · tomo slice 37/74.0]
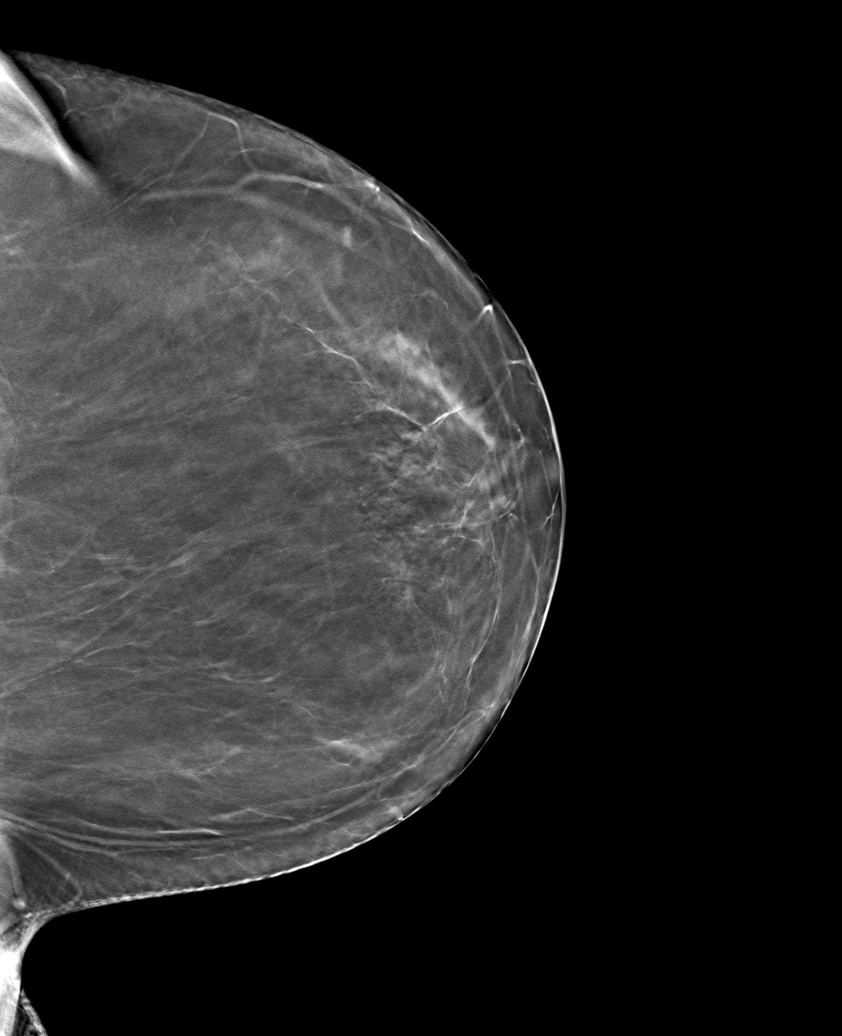

[6 of 30 positions shown; findings below may reference images not displayed]

ACR Breast Density Category b: There are scattered areas of
fibroglandular density.
FINDINGS: There are no findings suspicious for malignancy.
IMPRESSION: No mammographic evidence of malignancy. A result letter of this
screening mammogram will be mailed directly to the patient.

RECOMMENDATION:
Screening mammogram in one year. (Code:51-O-LD2)

BI-RADS CATEGORY  1: Negative.

## 2022-05-09 ENCOUNTER — Ambulatory Visit: Payer: 59 | Admitting: Student

## 2022-05-09 NOTE — Progress Notes (Deleted)
   Cardiology Office Note    Date:  05/09/2022  ID:  Debra Graves, DOB January 24, 1955, MRN [id number] Cardiologist: Nona Dell, MD    History of Present Illness:    Debra Graves is a 67 y.o. female with past medical history of CAD (moderate RCA disease by catheterization 2006), paroxysmal atrial fibrillation, HLD, carotid artery stenosis and aortic insufficiency who presents to the office today for 1-month follow-up.  She was examined Dr. Diona Browner in 10/2021 and had recently been diagnosed with atrial fibrillation and recent monitor had shown rate controlled atrial fibrillation with 4% burden.  She was continued on her current medical therapy including Toprol-XL for rate control and Eliquis for anticoagulation.  - FLP  Studies Reviewed:   EKG: EKG is*** ordered today and demonstrates ***  Echocardiogram: 08/2021 IMPRESSIONS     1. Left ventricular ejection fraction, by estimation, is 60 to 65%. The  left ventricle has normal function. The left ventricle has no regional  wall motion abnormalities. Left ventricular diastolic parameters are  consistent with Grade I diastolic  dysfunction (impaired relaxation).   2. Right ventricular systolic function is normal. The right ventricular  size is normal. Tricuspid regurgitation signal is inadequate for assessing  PA pressure.   3. The mitral valve is grossly normal. No evidence of mitral valve  regurgitation.   4. Aortic valve regurgitation is moderate. Aortic valve  sclerosis/calcification is present, without any evidence of aortic  stenosis.   5. The inferior vena cava is normal in size with greater than 50%  respiratory variability, suggesting right atrial pressure of 3 mmHg.   Comparison(s): No significant change from prior study.    Event Monitor: 08/2021  Predominant rhythm is sinus with heart rate ranging from 56 bpm up to 109 bpm and average heart rate 69 bpm. Paroxysmal atrial fibrillation was documented, overall  rhythm burden of only 4% however.  Average heart rate during events was 98 bpm with limited rapid ventricular response. There were rare PACs including atrial couplets and triplets representing less than 1% total beats. There were rare PVCs representing less than 1% total beats. No pauses.  Risk Assessment/Calculations:   {Does this patient have ATRIAL FIBRILLATION?[phone]} No BP recorded.  {Refresh Note OR Click here to enter BP  :1}***         Physical Exam:   VS:  There were no vitals taken for this visit.   Wt Readings from Last 3 Encounters:  11/07/21 225 lb 9.6 oz (102.3 kg)  08/23/21 227 lb (103 kg)  05/29/20 223 lb 12.8 oz (101.5 kg)     GEN: Well nourished, well developed in no acute distress NECK: No JVD; No carotid bruits CARDIAC: ***RRR, no murmurs, rubs, gallops RESPIRATORY:  Clear to auscultation without rales, wheezing or rhonchi  ABDOMEN: Appears non-distended. No obvious abdominal masses. EXTREMITIES: No clubbing or cyanosis. No edema.  Distal pedal pulses are 2+ bilaterally.   Assessment and Plan:   1. Paroxysmal Atrial Fibrillation - ***  2. CAD - ***  3. Aortic Regurgitation - This was moderate by echocardiogram in 08/2021.  4. Carotid Artery Disease - Dopplers in 08/2021 showed minimal heterogeneous and calcified plaque with no hemodynamically significant stenosis.  Continue Crestor 40 mg daily.  She is not on ASA given the need for anticoagulation.  5. HLD - LDL was at 82 on most recent check and Crestor was titrated to  daily in 08/2021. ***     Signed, Ellsworth Lennox, PA-C

## 2022-05-14 ENCOUNTER — Encounter: Payer: Self-pay | Admitting: Student

## 2022-05-14 ENCOUNTER — Ambulatory Visit: Payer: 59 | Attending: Student | Admitting: Student

## 2022-05-14 ENCOUNTER — Encounter: Payer: Self-pay | Admitting: *Deleted

## 2022-05-14 VITALS — BP 130/70 | HR 82 | Ht 66.0 in | Wt 234.6 lb

## 2022-05-14 DIAGNOSIS — I351 Nonrheumatic aortic (valve) insufficiency: Secondary | ICD-10-CM | POA: Diagnosis not present

## 2022-05-14 DIAGNOSIS — I2510 Atherosclerotic heart disease of native coronary artery without angina pectoris: Secondary | ICD-10-CM

## 2022-05-14 DIAGNOSIS — E785 Hyperlipidemia, unspecified: Secondary | ICD-10-CM

## 2022-05-14 DIAGNOSIS — I480 Paroxysmal atrial fibrillation: Secondary | ICD-10-CM

## 2022-05-14 DIAGNOSIS — I6523 Occlusion and stenosis of bilateral carotid arteries: Secondary | ICD-10-CM | POA: Diagnosis not present

## 2022-05-14 NOTE — Patient Instructions (Signed)
Medication Instructions:  Your physician recommends that you continue on your current medications as directed. Please refer to the Current Medication list given to you today.  *If you need a refill on your cardiac medications before your next appointment, please call your pharmacy*   Lab Work: NONE   If you have labs (blood work) drawn today and your tests are completely normal, you will receive your results only by: MyChart Message (if you have MyChart) OR A paper copy in the mail If you have any lab test that is abnormal or we need to change your treatment, we will call you to review the results.   Testing/Procedures: Your physician has requested that you have an echocardiogram. Echocardiography is a painless test that uses sound waves to create images of your heart. It provides your doctor with information about the size and shape of your heart and how well your heart's chambers and valves are working. This procedure takes approximately one hour. There are no restrictions for this procedure. Please do NOT wear cologne, perfume, aftershave, or lotions (deodorant is allowed). Please arrive 15 minutes prior to your appointment time.     Follow-Up: At [facility], you and your health needs are our priority.  As part of our continuing mission to provide you with exceptional heart care, we have created designated Provider Care Teams.  These Care Teams include your primary Cardiologist (physician) and Advanced Practice Providers (APPs -  Physician Assistants and Nurse Practitioners) who all work together to provide you with the care you need, when you need it.  We recommend signing up for the patient portal called "MyChart".  Sign up information is provided on this After Visit Summary.  MyChart is used to connect with patients for Virtual Visits (Telemedicine).  Patients are able to view lab/test results, encounter notes, upcoming appointments, etc.  Non-urgent messages can be sent to  your provider as well.   To learn more about what you can do with MyChart, go to [email or web address].    Your next appointment:   6 month(s)  Provider:   You may see Nona Dell, MD or one of the following Advanced Practice Providers on your designated Care Team:   Randall An, PA-C  Jacolyn Reedy, PA-C     Other Instructions Thank you for choosing [facility]!

## 2022-05-14 NOTE — Progress Notes (Signed)
Cardiology Office Note    Date:  05/14/2022  ID:  TESSY PAWELSKI, DOB 1955/02/06, MRN [id number] Cardiologist: Nona Dell, MD    History of Present Illness:    Debra Graves is a 67 y.o. female with past medical history of CAD (moderate RCA disease by catheterization 2006), paroxysmal atrial fibrillation, HLD, carotid artery stenosis and aortic insufficiency who presents to the office today for 56-month follow-up.   She was examined by Dr. Diona Browner in 10/2021 and had recently been diagnosed with atrial fibrillation and recent monitor had shown rate-controlled atrial fibrillation with a 4% burden. She was continued on her current medical therapy including Toprol-XL for rate-control and Eliquis for anticoagulation.   In talking with the patient today, she reports her biggest issue at this time is back pain which has been persistent for several years and this does limit her activity. She denies any recent chest pain or dyspnea on exertion when performing routine chores around the house. No specific orthopnea, PND or pitting edema. Does report issues with acid reflux as she develops a burning sensation in her sternal region after consuming certain foods and improves with antacids. She is planning to follow-up with GI in the next few months. She remains on Eliquis for anticoagulation with no reports of melena, hematochezia or hematuria. Has been under increased stress given family issues as her sister has end-stage lung disease.    Studies Reviewed:   EKG: EKG is not ordered today. EKG from 08/23/2021 is reviewed and demonstrates NSR, HR 90 with LAD and no acute ST changes.   Echocardiogram: 08/2021 IMPRESSIONS     1. Left ventricular ejection fraction, by estimation, is 60 to 65%. The  left ventricle has normal function. The left ventricle has no regional  wall motion abnormalities. Left ventricular diastolic parameters are  consistent with Grade I diastolic  dysfunction (impaired  relaxation).   2. Right ventricular systolic function is normal. The right ventricular  size is normal. Tricuspid regurgitation signal is inadequate for assessing  PA pressure.   3. The mitral valve is grossly normal. No evidence of mitral valve  regurgitation.   4. Aortic valve regurgitation is moderate. Aortic valve  sclerosis/calcification is present, without any evidence of aortic  stenosis.   5. The inferior vena cava is normal in size with greater than 50%  respiratory variability, suggesting right atrial pressure of 3 mmHg.   Comparison(s): No significant change from prior study.   Event Monitor: 08/2021 ZIO XT reviewed.  5 days, 21 hours analyzed.   Predominant rhythm is sinus with heart rate ranging from 56 bpm up to 109 bpm and average heart rate 69 bpm. Paroxysmal atrial fibrillation was documented, overall rhythm burden of only 4% however.  Average heart rate during events was 98 bpm with limited rapid ventricular response. There were rare PACs including atrial couplets and triplets representing less than 1% total beats. There were rare PVCs representing less than 1% total beats. No pauses.    Physical Exam:   VS:  BP 130/70   Pulse 82   Ht  (1.676 m)   Wt 234 lb 9.6 oz (106.4 kg)   SpO2 96%   BMI 37.87 kg/m    Wt Readings from Last 3 Encounters:  05/14/22 234 lb 9.6 oz (106.4 kg)  11/07/21 225 lb 9.6 oz (102.3 kg)  08/23/21 227 lb (103 kg)     GEN: Pleasant female appearing in no acute distress NECK: No JVD; No carotid bruits CARDIAC:  RRR, 2/6 diastolic murmur along RUSB.  RESPIRATORY:  Clear to auscultation without rales, wheezing or rhonchi  ABDOMEN: Appears non-distended. No obvious abdominal masses. EXTREMITIES: No clubbing or cyanosis. No pitting edema.  Distal pedal pulses are 2+ bilaterally.   Assessment and Plan:   1. Paroxysmal Atrial Fibrillation - She denies any recent palpitations and was overall asymptomatic with her episodes of atrial  fibrillation in the past. Continue Toprol-XL 25 mg daily for rate control. - She remains on Eliquis 5 mg twice daily for anticoagulation which is the appropriate dose at this time given her age, weight and renal function.  Labs in 07/2021 showed her hemoglobin was stable at 14.3 with platelets at 284 K. She did have labs with her PCP in the interim and we will request a copy of these.   2. CAD - She had moderate disease by prior catheterization in 2006. She denies any recent anginal symptoms and echocardiogram last year showed a preserved EF with no wall motion abnormalities. Reviewed warning signs to monitor for and encouraged to make [place] aware of any new shortness of breath or chest discomfort with activity. Could consider a follow-up Lexiscan Myoview or Coronary CT in the future for reassessment given the time frame since her last evaluation.  - Continue Toprol-XL 25 mg daily and Crestor 40 mg daily. She is not on ASA given the need for anticoagulation.   3. Aortic Regurgitation - This was moderate by echocardiogram in 08/2021. Will plan for a follow-up echocardiogram prior to her next office visit in 6 months.    4. Carotid Artery Disease - Dopplers in 08/2021 showed minimal heterogeneous and calcified plaque with no hemodynamically significant stenosis. Continue Crestor 40 mg daily. She is not on ASA given the need for anticoagulation.   5. HLD - LDL was at 82 on most recent check and Crestor was titrated to  daily in 08/2021. She reports having labs with her PCP in the interim and we will request a copy of these.    Signed, Ellsworth Lennox, PA-C

## 2022-05-16 ENCOUNTER — Encounter: Payer: Self-pay | Admitting: Internal Medicine

## 2022-06-02 DIAGNOSIS — Z0001 Encounter for general adult medical examination with abnormal findings: Secondary | ICD-10-CM | POA: Diagnosis not present

## 2022-06-02 DIAGNOSIS — K219 Gastro-esophageal reflux disease without esophagitis: Secondary | ICD-10-CM | POA: Diagnosis not present

## 2022-06-02 DIAGNOSIS — M159 Polyosteoarthritis, unspecified: Secondary | ICD-10-CM | POA: Diagnosis not present

## 2022-06-02 DIAGNOSIS — G894 Chronic pain syndrome: Secondary | ICD-10-CM | POA: Diagnosis not present

## 2022-06-02 DIAGNOSIS — I10 Essential (primary) hypertension: Secondary | ICD-10-CM | POA: Diagnosis not present

## 2022-06-02 DIAGNOSIS — I2510 Atherosclerotic heart disease of native coronary artery without angina pectoris: Secondary | ICD-10-CM | POA: Diagnosis not present

## 2022-06-02 DIAGNOSIS — I4891 Unspecified atrial fibrillation: Secondary | ICD-10-CM | POA: Diagnosis not present

## 2022-06-13 ENCOUNTER — Ambulatory Visit (HOSPITAL_COMMUNITY)
Admission: RE | Admit: 2022-06-13 | Discharge: 2022-06-13 | Disposition: A | Discharge status: Home / Self Care | Payer: 59 | Source: Ambulatory Visit | Attending: Student | Admitting: Student

## 2022-06-13 DIAGNOSIS — D518 Other vitamin B12 deficiency anemias: Secondary | ICD-10-CM | POA: Diagnosis not present

## 2022-06-13 DIAGNOSIS — I351 Nonrheumatic aortic (valve) insufficiency: Secondary | ICD-10-CM | POA: Insufficient documentation

## 2022-06-13 DIAGNOSIS — E559 Vitamin D deficiency, unspecified: Secondary | ICD-10-CM | POA: Diagnosis not present

## 2022-06-13 DIAGNOSIS — Z0001 Encounter for general adult medical examination with abnormal findings: Secondary | ICD-10-CM | POA: Diagnosis not present

## 2022-06-13 DIAGNOSIS — G9332 Myalgic encephalomyelitis/chronic fatigue syndrome: Secondary | ICD-10-CM | POA: Diagnosis not present

## 2022-06-13 LAB — ECHOCARDIOGRAM COMPLETE
Area-P 1/2: 3.77 cm2
P 1/2 time: 504 msec
S' Lateral: 2.8 cm

## 2022-06-13 LAB — TSH: TSH: 3.39 (ref 0.41–5.90)

## 2022-06-13 NOTE — Progress Notes (Signed)
*  PRELIMINARY RESULTS* Echocardiogram 2D Echocardiogram has been performed.  Stacey Drain 06/13/2022, 1:44 PM

## 2022-07-09 ENCOUNTER — Other Ambulatory Visit: Payer: Self-pay | Admitting: Gastroenterology

## 2022-07-10 NOTE — Telephone Encounter (Signed)
Needs ov. Provided 30 day supply.

## 2022-07-16 ENCOUNTER — Encounter: Payer: Self-pay | Admitting: Gastroenterology

## 2022-07-30 ENCOUNTER — Ambulatory Visit: Payer: 59 | Admitting: Gastroenterology

## 2022-07-30 ENCOUNTER — Other Ambulatory Visit: Payer: Self-pay | Admitting: Physician Assistant

## 2022-08-11 ENCOUNTER — Ambulatory Visit: Payer: 59 | Admitting: Gastroenterology

## 2022-08-11 NOTE — Progress Notes (Deleted)
GI Office Note    Referring Provider: Elfredia Nevins, MD Primary Care Physician:  Elfredia Nevins, MD  Primary Gastroenterologist: Roetta Sessions, MD   Chief Complaint   No chief complaint on file.   History of Present Illness   Debra Graves is a 67 y.o. female presenting today for follow up for further refills. Last seen in 05/2020. She has h/o GERD, constipation. Screening colonoscopy due in 05/2023.   Labs from [facility] 05/2022      Medications   Current Outpatient Medications  Medication Sig Dispense Refill   clonazePAM (KLONOPIN) 0.5 MG tablet Take 0.5 mg by mouth daily.     Docusate Calcium (STOOL SOFTENER PO) Take 2 tablets by mouth daily.     ELIQUIS 5 MG TABS tablet Take 5 mg by mouth 2 (two) times daily.     escitalopram (LEXAPRO) 20 MG tablet Take 20 mg by mouth daily.     esomeprazole (NEXIUM) 20 MG capsule Take 1 capsule (20 mg total) by mouth 2 (two) times daily before a meal. Office visit needed for additional refills. 60 capsule 0   fish oil-omega-3 fatty acids 1000 MG capsule Take 2 g by mouth daily.     furosemide (LASIX) 20 MG tablet Take 20 mg by mouth 2 (two) times daily.     hydrocortisone (CORTEF) 10 MG tablet Take 10 mg by mouth 2 (two) times daily.     LORazepam (ATIVAN) 1 MG tablet Take 1 mg by mouth every 6 (six) hours as needed for anxiety.     meloxicam (MOBIC) 7.5 MG tablet Take 7.5 mg by mouth 2 (two) times daily.     metoprolol succinate (TOPROL-XL) 25 MG 24 hr tablet TAKE 1 TABLET BY MOUTH ONCE DAILY. 30 tablet 3   Multiple Vitamin (MULTIVITAMIN) capsule Take 1 capsule by mouth daily. Reported on 03/29/2015 (Patient not taking: Reported on 05/14/2022)     rosuvastatin (CRESTOR) 40 MG tablet TAKE 1 TABLET BY MOUTH ONCE DAILY. 90 tablet 3   Vitamin D, Ergocalciferol, 2000 units CAPS Take 1 capsule by mouth daily.     No current facility-administered medications for this visit.    Allergies   Allergies as of 08/11/2022 - Review Complete  05/14/2022  Allergen Reaction Noted   Albuterol  04/14/2012   Bactrim [sulfamethoxazole-trimethoprim]  04/14/2012   Vytorin [ezetimibe-simvastatin]  03/25/2013     Past Medical History   Past Medical History:  Diagnosis Date   Adrenal insufficiency (HCC)    Anxiety    Aortic insufficiency    Arthritis    Back pain    CAD (coronary artery disease)    Moderate ostial RCA disease at cardiac catheterization 2006   Constipation    Depression    Fibroids    Hypercholesteremia    Mild carotid artery disease (HCC)    Rectocele     Past Surgical History   Past Surgical History:  Procedure Laterality Date   CARDIAC CATHETERIZATION  03/08/2004   mild progressive CAD   carotid duplex  03/09/2007   rt subclavian 0 to 49%,right bulb 0 to 49 %,lf ICA normal   COLONOSCOPY N/A 06/16/2013   Dr. Jena Gauss: hyperplastic polyps, melanosis coli    DOPPLER ECHOCARDIOGRAPHY  03/20/2010   EF >55 %,trace mitral regurg,mild tricuspid regurg,aortic valve mildly sclerotic'mild to moderate aortic regurg,trace pulmonic valver regurg   NM MYOCAR PERF WALL MOTION  03/19/06   EF 61%,low risk study    Past Family History   Family History  Problem Relation Age of Onset   Cancer Mother 33       Breast then bones and all over   Coronary artery disease Father    Heart disease Father    Stroke Father    Hypertension Sister    Breast cancer Sister 54   Heart disease Sister    Stroke Brother    Coronary artery disease Brother    Coronary artery disease Brother    Coronary artery disease Brother    Colon cancer Neg Hx     Past Social History   Social History   Socioeconomic History   Marital status: Divorced    Spouse name: Not on file   Number of children: Not on file   Years of education: Not on file   Highest education level: Not on file  Occupational History   Occupation: disability    Employer: UNEMPLOYED  Tobacco Use   Smoking status: Former    Current packs/day: 0.50    Average  packs/day: 0.5 packs/day for 41.0 years (20.5 ttl pk-yrs)    Types: Cigarettes   Smokeless tobacco: Never  Vaping Use   Vaping status: Never Used  Substance and Sexual Activity   Alcohol use: No   Drug use: No   Sexual activity: Not Currently    Birth control/protection: None, Post-menopausal    Comment: once in 9 months  Other Topics Concern   Not on file  Social History Narrative   Drinks 3 cups of coffee daily.   Social Determinants of Health   Financial Resource Strain: Not on file  Food Insecurity: Not on file  Transportation Needs: Not on file  Physical Activity: Not on file  Stress: Not on file  Social Connections: Not on file  Intimate Partner Violence: Not on file    Review of Systems   General: Negative for anorexia, weight loss, fever, chills, fatigue, weakness. ENT: Negative for hoarseness, difficulty swallowing , nasal congestion. CV: Negative for chest pain, angina, palpitations, dyspnea on exertion, peripheral edema.  Respiratory: Negative for dyspnea at rest, dyspnea on exertion, cough, sputum, wheezing.  GI: See history of present illness. GU:  Negative for dysuria, hematuria, urinary incontinence, urinary frequency, nocturnal urination.  Endo: Negative for unusual weight change.     Physical Exam   There were no vitals taken for this visit.   General: Well-nourished, well-developed in no acute distress.  Eyes: No icterus. Mouth: Oropharyngeal mucosa moist and pink , no lesions erythema or exudate. Lungs: Clear to auscultation bilaterally.  Heart: Regular rate and rhythm, no murmurs rubs or gallops.  Abdomen: Bowel sounds are normal, nontender, nondistended, no hepatosplenomegaly or masses,  no abdominal bruits or hernia , no rebound or guarding.  Rectal: ***  Extremities: No lower extremity edema. No clubbing or deformities. Neuro: Alert and oriented x 4   Skin: Warm and dry, no jaundice.   Psych: Alert and cooperative, normal mood and  affect.  Labs   *** Imaging Studies   No results found.  Assessment       PLAN   ***   Leanna Battles. Melvyn Neth, MHS, PA-C [facility]

## 2022-08-20 DIAGNOSIS — Z0001 Encounter for general adult medical examination with abnormal findings: Secondary | ICD-10-CM | POA: Diagnosis not present

## 2022-08-20 DIAGNOSIS — K219 Gastro-esophageal reflux disease without esophagitis: Secondary | ICD-10-CM | POA: Diagnosis not present

## 2022-08-20 DIAGNOSIS — M1991 Primary osteoarthritis, unspecified site: Secondary | ICD-10-CM | POA: Diagnosis not present

## 2022-08-20 DIAGNOSIS — I10 Essential (primary) hypertension: Secondary | ICD-10-CM | POA: Diagnosis not present

## 2022-08-20 DIAGNOSIS — E271 Primary adrenocortical insufficiency: Secondary | ICD-10-CM | POA: Diagnosis not present

## 2022-08-20 DIAGNOSIS — I351 Nonrheumatic aortic (valve) insufficiency: Secondary | ICD-10-CM | POA: Diagnosis not present

## 2022-08-26 ENCOUNTER — Ambulatory Visit: Payer: 59 | Admitting: Gastroenterology

## 2022-09-03 ENCOUNTER — Encounter: Payer: Self-pay | Admitting: Gastroenterology

## 2022-09-03 ENCOUNTER — Ambulatory Visit (INDEPENDENT_AMBULATORY_CARE_PROVIDER_SITE_OTHER): Payer: 59 | Admitting: Gastroenterology

## 2022-09-03 VITALS — BP 115/74 | HR 85 | Temp 97.9°F | Ht 66.0 in | Wt 234.0 lb

## 2022-09-03 DIAGNOSIS — K219 Gastro-esophageal reflux disease without esophagitis: Secondary | ICD-10-CM | POA: Diagnosis not present

## 2022-09-03 MED ORDER — ESOMEPRAZOLE MAGNESIUM 20 MG PO CPDR: 20.0000 mg | DELAYED_RELEASE_CAPSULE | Freq: Two times a day (BID) | ORAL | 11 refills | Status: DC

## 2022-09-03 NOTE — Patient Instructions (Signed)
Continue esomeprazole (Nexium) 20mg  twice daily before breakfast and evening meal. RX sent to your pharmacy. We will see you back next year in 05/2023 and schedule your colonoscopy at that time.  Please call if you have any questions or concerns.

## 2022-09-03 NOTE — Progress Notes (Signed)
GI Office Note    Referring Provider: Elfredia Nevins, MD Primary Care Physician:  Elfredia Nevins, MD  Primary Gastroenterologist: Roetta Sessions, MD   Chief Complaint   Chief Complaint  Patient presents with   Follow-up    Here for refills on esomeprazole    History of Present Illness   Debra Graves is a 67 y.o. female presenting today for follow up. Last seen 05/2020. She has history of GERD, constipation. She is due colonoscopy in 2025.   Overall she is doing well from a GI standpoint.  Constipation controlled with stool softeners.  She ran out of Nexium and bought over-the-counter omeprazole which did not control her reflux quite as well.  She had a couple episodes of vomiting which she felt was reflux related.  Denies any dysphagia.  No abdominal pain.  No melena or rectal bleeding.  We last saw her she was diagnosed with A-fib and started on Eliquis.  Colonoscopy 05/2013: rectal and colonic polyps (hyperplastic). hemorrhoids. Melanosis coli.  Medications   Current Outpatient Medications  Medication Sig Dispense Refill   clonazePAM (KLONOPIN) 0.5 MG tablet Take 0.5 mg by mouth daily.     Docusate Calcium (STOOL SOFTENER PO) Take 2 tablets by mouth daily.     DULoxetine (CYMBALTA) 60 MG capsule Take 60 mg by mouth daily.     ELIQUIS 5 MG TABS tablet Take 5 mg by mouth 2 (two) times daily.     esomeprazole (NEXIUM) 20 MG capsule Take 1 capsule (20 mg total) by mouth 2 (two) times daily before a meal. Office visit needed for additional refills. 60 capsule 0   fish oil-omega-3 fatty acids 1000 MG capsule Take 2 g by mouth daily.     furosemide (LASIX) 20 MG tablet Take 20 mg by mouth 2 (two) times daily.     hydrocortisone (CORTEF) 10 MG tablet Take 10 mg by mouth 2 (two) times daily.     LORazepam (ATIVAN) 1 MG tablet Take 1 mg by mouth every 6 (six) hours as needed for anxiety.     meloxicam (MOBIC) 7.5 MG tablet Take 7.5 mg by mouth 2 (two) times daily.      metoprolol succinate (TOPROL-XL) 25 MG 24 hr tablet TAKE 1 TABLET BY MOUTH ONCE DAILY. 30 tablet 3   rosuvastatin (CRESTOR) 40 MG tablet TAKE 1 TABLET BY MOUTH ONCE DAILY. 90 tablet 3   tiZANidine (ZANAFLEX) 4 MG tablet Take 4 mg by mouth every 6 (six) hours as needed.     Vitamin D, Ergocalciferol, 2000 units CAPS Take 1 capsule by mouth daily.     No current facility-administered medications for this visit.    Allergies   Allergies as of 09/03/2022 - Review Complete 09/03/2022  Allergen Reaction Noted   Albuterol  04/14/2012   Bactrim [sulfamethoxazole-trimethoprim]  04/14/2012   Vytorin [ezetimibe-simvastatin]  03/25/2013            Review of Systems   General: Negative for anorexia, weight loss, fever, chills, fatigue, weakness. ENT: Negative for hoarseness, difficulty swallowing , nasal congestion. CV: Negative for chest pain, angina, palpitations, dyspnea on exertion, peripheral edema.  Respiratory: Negative for dyspnea at rest, dyspnea on exertion, cough, sputum, wheezing.  GI: See history of present illness. GU:  Negative for dysuria, hematuria, urinary incontinence, urinary frequency, nocturnal urination.  Endo: Negative for unusual weight change.     Physical Exam   BP 115/74 (BP Location: Right Arm, Patient Position: Sitting, Cuff Size: Large)  Pulse 85   Temp 97.9 F (36.6 C) (Oral)   Ht 5\' 6"  (1.676 m)   Wt 234 lb (106.1 kg)   SpO2 96%   BMI 37.77 kg/m    General: Well-nourished, well-developed in no acute distress.  Eyes: No icterus. Mouth: Oropharyngeal mucosa moist and pink  Lungs: Clear to auscultation bilaterally.  Heart: Regular rate and rhythm, no murmurs rubs or gallops.  Abdomen: Bowel sounds are normal, nontender, nondistended, no hepatosplenomegaly or masses,  no abdominal bruits or hernia , no rebound or guarding.  Rectal: Not performed Extremities: No lower extremity edema. No clubbing or deformities. Neuro: Alert and oriented x 4   Skin:  Warm and dry, no jaundice.   Psych: Alert and cooperative, normal mood and affect.  Labs   Labs from May 2024 reviewed.  CBC, LFTs, B12 and folate all normal Imaging Studies   No results found.  Assessment/Plan:   *GERD *Constipation  Doing reasonably well except for recent flare of reflux off of esomeprazole.  Will restart esomeprazole 20 mg twice daily.  Reinforced antireflux measures.  She will call if symptoms do not settle down.  Constipation adequately controlled at this time.  Next year she is due for screening colonoscopy, will have her come back in May 2025 and schedule colonoscopy at that time.    Leanna Battles. Melvyn Neth, MHS, PA-C [facility]

## 2022-10-20 DIAGNOSIS — I4891 Unspecified atrial fibrillation: Secondary | ICD-10-CM | POA: Diagnosis not present

## 2022-10-20 DIAGNOSIS — D6869 Other thrombophilia: Secondary | ICD-10-CM | POA: Diagnosis not present

## 2022-10-20 DIAGNOSIS — K219 Gastro-esophageal reflux disease without esophagitis: Secondary | ICD-10-CM | POA: Diagnosis not present

## 2022-10-20 DIAGNOSIS — I2510 Atherosclerotic heart disease of native coronary artery without angina pectoris: Secondary | ICD-10-CM | POA: Diagnosis not present

## 2022-10-20 DIAGNOSIS — E271 Primary adrenocortical insufficiency: Secondary | ICD-10-CM | POA: Diagnosis not present

## 2022-10-24 ENCOUNTER — Telehealth: Payer: Self-pay | Admitting: Cardiology

## 2022-10-24 NOTE — Telephone Encounter (Signed)
Patient called to inform us that she is no longer using [facility]. Patient is now using [facility] for her pharmacy.

## 2022-10-24 NOTE — Telephone Encounter (Signed)
Changed pharmacy per pt request to [facility]

## 2022-11-20 ENCOUNTER — Ambulatory Visit: Payer: 59 | Admitting: Cardiology

## 2022-11-20 DIAGNOSIS — G894 Chronic pain syndrome: Secondary | ICD-10-CM | POA: Diagnosis not present

## 2022-11-20 DIAGNOSIS — I4891 Unspecified atrial fibrillation: Secondary | ICD-10-CM | POA: Diagnosis not present

## 2022-11-20 DIAGNOSIS — E271 Primary adrenocortical insufficiency: Secondary | ICD-10-CM | POA: Diagnosis not present

## 2022-11-20 DIAGNOSIS — D6869 Other thrombophilia: Secondary | ICD-10-CM | POA: Diagnosis not present

## 2022-12-15 ENCOUNTER — Other Ambulatory Visit: Payer: Self-pay | Admitting: Cardiology

## 2022-12-16 ENCOUNTER — Ambulatory Visit: Payer: 59

## 2022-12-16 ENCOUNTER — Other Ambulatory Visit: Payer: Self-pay

## 2022-12-16 ENCOUNTER — Ambulatory Visit
Admission: EM | Admit: 2022-12-16 | Discharge: 2022-12-16 | Disposition: A | Discharge status: Home / Self Care | Payer: 59 | Attending: Family Medicine | Admitting: Family Medicine

## 2022-12-16 ENCOUNTER — Other Ambulatory Visit: Payer: Self-pay | Admitting: Cardiology

## 2022-12-16 DIAGNOSIS — R000 Tachycardia, unspecified: Secondary | ICD-10-CM | POA: Diagnosis not present

## 2022-12-16 DIAGNOSIS — M549 Dorsalgia, unspecified: Secondary | ICD-10-CM | POA: Diagnosis not present

## 2022-12-16 DIAGNOSIS — R051 Acute cough: Secondary | ICD-10-CM | POA: Diagnosis not present

## 2022-12-16 DIAGNOSIS — K449 Diaphragmatic hernia without obstruction or gangrene: Secondary | ICD-10-CM | POA: Diagnosis not present

## 2022-12-16 DIAGNOSIS — R059 Cough, unspecified: Secondary | ICD-10-CM | POA: Diagnosis not present

## 2022-12-16 LAB — POCT URINALYSIS DIP (MANUAL ENTRY)
Blood, UA: NEGATIVE
Glucose, UA: NEGATIVE mg/dL
Ketones, POC UA: NEGATIVE mg/dL
Leukocytes, UA: NEGATIVE
Nitrite, UA: NEGATIVE
Protein Ur, POC: 30 mg/dL — AB
Spec Grav, UA: >=1.03 — AB (ref 1.010–1.025)
Urobilinogen, UA: 0.2 U/dL
pH, UA: 5.5 (ref 5.0–8.0)

## 2022-12-16 MED ORDER — CYCLOBENZAPRINE HCL 5 MG PO TABS: 5.0000 mg | ORAL_TABLET | Freq: Two times a day (BID) | ORAL | 0 refills | Status: DC | PRN

## 2022-12-16 NOTE — ED Triage Notes (Signed)
Pt reports pain in between shoulder blades, bilateral flank pain,and lower abdominal pain x 2 weeks. Pt has had a cough and congestion.

## 2022-12-16 NOTE — Discharge Instructions (Signed)
We will call when your chest x-ray report comes back if we need to discuss any new findings or changes.  It appears that your back and side pain is muscular, possibly from coughing or movement.  I have sent over a muscle relaxer and you may use heat, massage, stretches, Tylenol.  Follow-up as soon as possible with your primary care provider for a recheck of your symptoms, go to the emergency department for significantly worsening symptoms at any time.

## 2022-12-16 NOTE — ED Provider Notes (Signed)
[facility]    CSN: [id number] Arrival date & time: 12/16/22  1355      History   Chief Complaint No chief complaint on file.   HPI Debra Graves is a 67 y.o. female.   Presenting today with 2-week history of chest wall pain, bilateral rib pain, back pain, cough, congestion.  States the cough and congestion seem to start first and have been significantly improving.  Pain is worse with movement and pressing the areas.  Denies fever, chills, wheezing, shortness of breath, urinary symptoms, bowel changes.  So far trying over-the-counter pain relievers with mild temporary benefit of pain.  Past medical history significant for CAD, GERD, atrial fibrillation, chronic back issues, arthritis.    Past Medical History:  Diagnosis Date   Adrenal insufficiency (HCC)    Anxiety    Aortic insufficiency    Arthritis    Back pain    CAD (coronary artery disease)    Moderate ostial RCA disease at cardiac catheterization 2006   Constipation    Depression    Fibroids    Hypercholesteremia    Mild carotid artery disease (HCC)    Rectocele     Patient Active Problem List   Diagnosis Date Noted   Tobacco abuse 06/09/2016   Adrenal insufficiency (HCC) 03/16/2015   Encounter for screening colonoscopy 05/25/2013   GERD (gastroesophageal reflux disease) 05/25/2013   Aortic insufficiency 02/22/2013   CAD (coronary artery disease) 02/22/2013   Rectocele 04/14/2012   Constipation 04/14/2012   Heart disease 04/13/2012   Hypercholesteremia 04/13/2012   Fibroids 04/13/2012   Arthritis 04/13/2012   Anxiety 04/13/2012   Depression 04/13/2012    Past Surgical History:  Procedure Laterality Date   CARDIAC CATHETERIZATION  03/08/2004   mild progressive CAD   carotid duplex  03/09/2007   rt subclavian 0 to 49%,right bulb 0 to 49 %,lf ICA normal   COLONOSCOPY N/A 06/16/2013   Dr. Jena Gauss: hyperplastic polyps, melanosis coli    DOPPLER ECHOCARDIOGRAPHY  03/20/2010   EF >55 %,trace  mitral regurg,mild tricuspid regurg,aortic valve mildly sclerotic'mild to moderate aortic regurg,trace pulmonic valver regurg   NM MYOCAR PERF WALL MOTION  03/19/06   EF 61%,low risk study    OB History     Gravida  1   Para  1   Term      Preterm  1   AB      Living         SAB      IAB      Ectopic      Multiple      Live Births               Home Medications    Prior to Admission medications   Medication Sig Start Date End Date Taking? Authorizing Provider  cyclobenzaprine (FLEXERIL) 5 MG tablet Take 1 tablet (5 mg total) by mouth 2 (two) times daily as needed for muscle spasms. Do not drink alcohol or drive while taking this medication.  May cause drowsiness. 12/16/22  Yes Particia Nearing, PA-C  clonazePAM (KLONOPIN) 0.5 MG tablet Take 0.5 mg by mouth daily.    [provider]  Docusate Calcium (STOOL SOFTENER PO) Take 2 tablets by mouth daily.    [provider]  DULoxetine (CYMBALTA) 60 MG capsule Take 60 mg by mouth daily. 07/09/22   [provider]  ELIQUIS 5 MG TABS tablet Take 5 mg by mouth 2 (two) times daily. 08/14/21  [provider]  esomeprazole (NEXIUM) 20 MG capsule Take 1 capsule (20 mg total) by mouth 2 (two) times daily before a meal. Office visit needed for additional refills. 09/03/22   Tiffany Kocher, PA-C  fish oil-omega-3 fatty acids 1000 MG capsule Take 2 g by mouth daily.    [provider]  furosemide (LASIX) 20 MG tablet Take 20 mg by mouth 2 (two) times daily. 04/23/20   [provider]  hydrocortisone (CORTEF) 10 MG tablet Take 10 mg by mouth 2 (two) times daily.    [provider]  LORazepam (ATIVAN) 1 MG tablet Take 1 mg by mouth every 6 (six) hours as needed for anxiety.    [provider]  meloxicam (MOBIC) 7.5 MG tablet Take 7.5 mg by mouth 2 (two) times daily. 11/23/18   [provider]  metoprolol succinate (TOPROL-XL) 25 MG 24 hr tablet TAKE 1  TABLET BY MOUTH ONCE DAILY. 12/16/22   Jonelle Sidle, MD  rosuvastatin (CRESTOR) 40 MG tablet TAKE 1 TABLET BY MOUTH ONCE DAILY. 12/10/21   Jonelle Sidle, MD  tiZANidine (ZANAFLEX) 4 MG tablet Take 4 mg by mouth every 6 (six) hours as needed. 06/02/22   [provider]  Vitamin D, Ergocalciferol, 2000 units CAPS Take 1 capsule by mouth daily.    [provider]    Family History Family History  Problem Relation Age of Onset   Cancer Mother 38       Breast then bones and all over   Coronary artery disease Father    Heart disease Father    Stroke Father    Hypertension Sister    Breast cancer Sister 1   Heart disease Sister    Stroke Brother    Coronary artery disease Brother    Coronary artery disease Brother    Coronary artery disease Brother    Colon cancer Neg Hx     Social History Social History   Tobacco Use   Smoking status: Former    Current packs/day: 0.50    Average packs/day: 0.5 packs/day for 41.0 years (20.5 ttl pk-yrs)    Types: Cigarettes   Smokeless tobacco: Never  Vaping Use   Vaping status: Never Used  Substance Use Topics   Alcohol use: No   Drug use: No     Allergies   Albuterol, Bactrim [sulfamethoxazole-trimethoprim], and Vytorin [ezetimibe-simvastatin]   Review of Systems Review of Systems Per HPI  Physical Exam Triage Vital Signs ED Triage Vitals  Encounter Vitals Group     BP 12/16/22 1555 (!) 141/68     Systolic BP Percentile --      Diastolic BP Percentile --      Pulse Rate 12/16/22 1555 (!) 120     Resp 12/16/22 1555 (!) 23     Temp 12/16/22 1555 98.3 F (36.8 C)     Temp src --      SpO2 12/16/22 1555 95 %     Weight --      Height --      Head Circumference --      Peak Flow --      Pain Score 12/16/22 1557 5     Pain Loc --      Pain Education --      Exclude from Growth Chart --    No data found.  Updated Vital Signs BP (!) 141/68 (BP Location: Right Arm)   Pulse (!) 120   Temp 98.3  F (36.8  C)   Resp (!) 23   SpO2 95%   Visual Acuity Right Eye Distance:   Left Eye Distance:   Bilateral Distance:    Right Eye Near:   Left Eye Near:    Bilateral Near:     Physical Exam Vitals and nursing note reviewed.  Constitutional:      Appearance: Normal appearance. She is not ill-appearing.  HENT:     Head: Atraumatic.     Mouth/Throat:     Mouth: Mucous membranes are moist.     Pharynx: Oropharynx is clear.  Eyes:     Extraocular Movements: Extraocular movements intact.     Conjunctiva/sclera: Conjunctivae normal.  Cardiovascular:     Rate and Rhythm: Normal rate.  Pulmonary:     Effort: Pulmonary effort is normal.     Breath sounds: Normal breath sounds. No wheezing or rales.     Comments: Chest rise symmetric bilaterally.  Chest wall, left and right rib region laterally, lateral back musculature tender to palpation reproducing her pain.  Range of motion intact diffusely Abdominal:     General: Bowel sounds are normal. There is no distension.     Palpations: Abdomen is soft.     Tenderness: There is no abdominal tenderness. There is no right CVA tenderness, left CVA tenderness or guarding.  Musculoskeletal:        General: Normal range of motion.     Cervical back: Normal range of motion and neck supple.  Skin:    General: Skin is warm and dry.     Findings: No bruising or erythema.  Neurological:     Mental Status: She is alert and oriented to person, place, and time.     Motor: No weakness.     Gait: Gait normal.  Psychiatric:        Mood and Affect: Mood normal.        Thought Content: Thought content normal.        Judgment: Judgment normal.      UC Treatments / Results  Labs (all labs ordered are listed, but only abnormal results are displayed) Labs Reviewed  POCT URINALYSIS DIP (MANUAL ENTRY) - Abnormal; Notable for the following components:      Result Value   Bilirubin, UA small (*)    Spec Grav, UA >=1.030 (*)    Protein Ur, POC =30  (*)    All other components within normal limits    EKG   Radiology DG Chest 2 View  Result Date: 12/16/2022 CLINICAL DATA:  Cough. EXAM: CHEST - 2 VIEW COMPARISON:  October 06, 2016. FINDINGS: The heart size and mediastinal contours are within normal limits. Small hiatal hernia. Both lungs are clear. The visualized skeletal structures are unremarkable. IMPRESSION: No active cardiopulmonary disease.  Probable small hiatal hernia. Electronically Signed   By: Lupita Raider M.D.   On: 12/16/2022 17:55    Procedures Procedures (including critical care time)  Medications Ordered in UC Medications - No data to display  Initial Impression / Assessment and Plan / UC Course  I have reviewed the triage vital signs and the nursing notes.  Pertinent labs & imaging results that were available during my care of the patient were reviewed by me and considered in my medical decision making (see chart for details).     Tachycardic and mildly tachypneic in triage, this was improved with rest.  She states this is her baseline and not acutely new with symptoms.  Urinalysis without evidence of  a urinary tract infection, chest x-ray today negative for pneumonia or other pulmonary cause of symptoms.  EKG today showing sinus tachycardia with PACs at 109 bpm without acute ST or T wave changes.  Heart rate improved to 99 bpm prior to discharge today.  Suspect muscular pain possibly from prolonged coughing.  Discussed muscle relaxers, heat, Voltaren gel, over-the-counter cough suppressants and PCP follow-up for worsening symptoms.  ED for significantly worsening symptoms.  Final Clinical Impressions(s) / UC Diagnoses   Final diagnoses:  Acute bilateral back pain, unspecified back location  Tachycardia  Acute cough     Discharge Instructions      We will call when your chest x-ray report comes back if we need to discuss any new findings or changes.  It appears that your back and side pain is muscular,  possibly from coughing or movement.  I have sent over a muscle relaxer and you may use heat, massage, stretches, Tylenol.  Follow-up as soon as possible with your primary care provider for a recheck of your symptoms, go to the emergency department for significantly worsening symptoms at any time.    ED Prescriptions     Medication Sig Dispense Auth. Provider   cyclobenzaprine (FLEXERIL) 5 MG tablet Take 1 tablet (5 mg total) by mouth 2 (two) times daily as needed for muscle spasms. Do not drink alcohol or drive while taking this medication.  May cause drowsiness. 10 tablet Particia Nearing, [place]      PDMP not reviewed this encounter.   Particia Nearing, [place] 12/16/22 [phone]

## 2023-01-06 DIAGNOSIS — R232 Flushing: Secondary | ICD-10-CM | POA: Diagnosis not present

## 2023-01-06 DIAGNOSIS — K219 Gastro-esophageal reflux disease without esophagitis: Secondary | ICD-10-CM | POA: Diagnosis not present

## 2023-01-06 DIAGNOSIS — I4891 Unspecified atrial fibrillation: Secondary | ICD-10-CM | POA: Diagnosis not present

## 2023-01-06 DIAGNOSIS — I351 Nonrheumatic aortic (valve) insufficiency: Secondary | ICD-10-CM | POA: Diagnosis not present

## 2023-01-06 DIAGNOSIS — E271 Primary adrenocortical insufficiency: Secondary | ICD-10-CM | POA: Diagnosis not present

## 2023-01-06 DIAGNOSIS — I10 Essential (primary) hypertension: Secondary | ICD-10-CM | POA: Diagnosis not present

## 2023-01-06 DIAGNOSIS — M1991 Primary osteoarthritis, unspecified site: Secondary | ICD-10-CM | POA: Diagnosis not present

## 2023-01-08 DIAGNOSIS — R232 Flushing: Secondary | ICD-10-CM | POA: Diagnosis not present

## 2023-01-08 DIAGNOSIS — R946 Abnormal results of thyroid function studies: Secondary | ICD-10-CM | POA: Diagnosis not present

## 2023-01-08 DIAGNOSIS — R1084 Generalized abdominal pain: Secondary | ICD-10-CM | POA: Diagnosis not present

## 2023-01-08 LAB — TSH: TSH: 0.01 — AB (ref 0.41–5.90)

## 2023-01-12 DIAGNOSIS — I10 Essential (primary) hypertension: Secondary | ICD-10-CM | POA: Diagnosis not present

## 2023-01-12 DIAGNOSIS — M1991 Primary osteoarthritis, unspecified site: Secondary | ICD-10-CM | POA: Diagnosis not present

## 2023-01-12 DIAGNOSIS — I2510 Atherosclerotic heart disease of native coronary artery without angina pectoris: Secondary | ICD-10-CM | POA: Diagnosis not present

## 2023-01-12 DIAGNOSIS — I351 Nonrheumatic aortic (valve) insufficiency: Secondary | ICD-10-CM | POA: Diagnosis not present

## 2023-01-12 DIAGNOSIS — I4891 Unspecified atrial fibrillation: Secondary | ICD-10-CM | POA: Diagnosis not present

## 2023-01-12 DIAGNOSIS — R232 Flushing: Secondary | ICD-10-CM | POA: Diagnosis not present

## 2023-01-12 DIAGNOSIS — E271 Primary adrenocortical insufficiency: Secondary | ICD-10-CM | POA: Diagnosis not present

## 2023-01-15 ENCOUNTER — Other Ambulatory Visit (HOSPITAL_COMMUNITY): Payer: Self-pay | Admitting: Internal Medicine

## 2023-01-15 DIAGNOSIS — R7989 Other specified abnormal findings of blood chemistry: Secondary | ICD-10-CM

## 2023-01-19 ENCOUNTER — Ambulatory Visit: Payer: 59 | Attending: Cardiology | Admitting: Cardiology

## 2023-01-19 ENCOUNTER — Other Ambulatory Visit: Payer: Self-pay | Admitting: Pharmacist

## 2023-01-19 ENCOUNTER — Encounter: Payer: Self-pay | Admitting: Cardiology

## 2023-01-19 VITALS — BP 102/55 | HR 106 | Ht 66.0 in | Wt 221.0 lb

## 2023-01-19 DIAGNOSIS — I2510 Atherosclerotic heart disease of native coronary artery without angina pectoris: Secondary | ICD-10-CM | POA: Diagnosis not present

## 2023-01-19 DIAGNOSIS — I6523 Occlusion and stenosis of bilateral carotid arteries: Secondary | ICD-10-CM | POA: Diagnosis not present

## 2023-01-19 DIAGNOSIS — E785 Hyperlipidemia, unspecified: Secondary | ICD-10-CM | POA: Diagnosis not present

## 2023-01-19 DIAGNOSIS — I480 Paroxysmal atrial fibrillation: Secondary | ICD-10-CM | POA: Diagnosis not present

## 2023-01-19 DIAGNOSIS — I351 Nonrheumatic aortic (valve) insufficiency: Secondary | ICD-10-CM

## 2023-01-19 MED ORDER — ELIQUIS 5 MG PO TABS: 5.0000 mg | ORAL_TABLET | Freq: Two times a day (BID) | ORAL | 1 refills | Status: DC

## 2023-01-19 MED ORDER — METOPROLOL TARTRATE 25 MG PO TABS: 75.0000 mg | ORAL_TABLET | Freq: Every day | ORAL | 6 refills | Status: DC

## 2023-01-19 NOTE — Patient Instructions (Signed)
Medication Instructions:  Your physician recommends that you continue on your current medications as directed. Please refer to the Current Medication list given to you today.  We refilled your Lopressor 75 mg daily   I have messaged our pharmacy team to refill your Eliquis.   Labwork: None today  Testing/Procedures: None today  Follow-Up: 3-4 weeks  Any Other Special Instructions Will Be Listed Below (If Applicable).  If you need a refill on your cardiac medications before your next appointment, please call your pharmacy.

## 2023-01-19 NOTE — Progress Notes (Signed)
Cardiology Office Note:  .   Date:  01/19/2023  ID:  Debra Graves, DOB 1955-12-26, MRN [id number] PCP: Elfredia Nevins, MD  [facility] Providers Cardiologist:  Nona Dell, MD    History of Present Illness: .   Debra Graves is a 67 y.o. female with a past medical history of CAD (moderate RCA disease by catheterization in 2006), paroxysmal atrial fibrillation, hyperlipidemia, carotid artery stenosis, and aortic insufficiency who presents today for follow-up.  Last seen in clinic 05/14/22 by Jaynie Bream, PA.  At that time the biggest complaint that she had reports of back pain that been persistent for several years but limited her activity.  She continued to have issues with acid reflux but it did improve with her antiacids.  She has upcoming follow-up with GI.  There were no episodes of bleeding noted with her anticoagulant therapy.  She was continued on her current medication regimen without changes.  She was also scheduled for an updated echocardiogram.  She was evaluated by urgent care on 12/16/2022 with a 2-week history of chest wall pain, bilateral rib pain, back pain, cough and congestion.  Pain was reproducible with mild palpation.  Suspect she was having muscular pain from prolonged coughing.  She was treated with muscle relaxers advised on ED precautions and discharged home.  She returns to clinic today stating that she has improved over the last several weeks.  She stated around Thanksgiving she had been extremely sick with cough and congestion.  She denies any chest pain today but she does have some dyspnea on exertion.  She stated a recent appointment with her PCP she was told she was back in atrial fibrillation and her Toprol-XL dosing was increased.  She states that she also needs refills of her apixaban today.  She states she has been compliant with her current medication regimen.  She denies any bleeding or blood noted in her stool or urine.  She stated that she was also  told that she had some elevated liver functions and has a recent upcoming abdominal ultrasound.  She said there was also some issues with her blood work with her thyroid but she has no plans for upcoming testing.  ROS: 10 point review of systems has been reviewed and considered negative except what is been listed in HPI  Studies Reviewed: Marland Kitchen   EKG Interpretation Date/Time:  Monday January 19 2023 13:20:40 EST Ventricular Rate:  106 PR Interval:    QRS Duration:  86 QT Interval:  358 QTC Calculation: 475 R Axis:   34  Text Interpretation: Atrial fibrillation Nonspecific T wave abnormality When compared with ECG of 16-Dec-2022 16:08, Confirmed by Charlsie Quest ([id number]) on 01/19/2023 1:33:37 PM    Echocardiogram 06/13/22 1. Left ventricular ejection fraction, by estimation, is 65 to 70%. Left  ventricular ejection fraction by PLAX is 68 %. The left ventricle has  normal function. The left ventricle has no regional wall motion  abnormalities. Left ventricular diastolic  parameters are consistent with Grade I diastolic dysfunction (impaired  relaxation).   2. Right ventricular systolic function is low normal. The right  ventricular size is normal.   3. The mitral valve is grossly normal. Trivial mitral valve  regurgitation.   4. The aortic valve is tricuspid. Aortic valve regurgitation is mild.  Aortic valve sclerosis is present, with no evidence of aortic valve  stenosis. Aortic regurgitation PHT measures 504 msec.   5. The inferior vena cava is normal in size with greater than  50%  respiratory variability, suggesting right atrial pressure of 3 mmHg.   Echocardiogram: 08/2021  1. Left ventricular ejection fraction, by estimation, is 60 to 65%. The  left ventricle has normal function. The left ventricle has no regional  wall motion abnormalities. Left ventricular diastolic parameters are  consistent with Grade I diastolic  dysfunction (impaired relaxation).   2. Right ventricular  systolic function is normal. The right ventricular  size is normal. Tricuspid regurgitation signal is inadequate for assessing  PA pressure.   3. The mitral valve is grossly normal. No evidence of mitral valve  regurgitation.   4. Aortic valve regurgitation is moderate. Aortic valve  sclerosis/calcification is present, without any evidence of aortic  stenosis.   5. The inferior vena cava is normal in size with greater than 50%  respiratory variability, suggesting right atrial pressure of 3 mmHg.   Comparison(s): No significant change from prior study.    Event Monitor: 08/2021 ZIO XT reviewed.  5 days, 21 hours analyzed.  Predominant rhythm is sinus with heart rate ranging from 56 bpm up to 109 bpm and average heart rate 69 bpm. Paroxysmal atrial fibrillation was documented, overall rhythm burden of only 4% however.  Average heart rate during events was 98 bpm with limited rapid ventricular response. There were rare PACs including atrial couplets and triplets representing less than 1% total beats. There were rare PVCs representing less than 1% total beats. No pauses. Risk Assessment/Calculations:    CHA2DS2-VASc Score = 3   This indicates a 3.2% annual risk of stroke. The patient's score is based upon: CHF History: 0 HTN History: 0 Diabetes History: 0 Stroke History: 0 Vascular Disease History: 1 Age Score: 1 Gender Score: 1            Physical Exam:   VS:  BP (!) 102/55 (BP Location: Left Arm, Patient Position: Sitting, Cuff Size: Large)   Pulse (!) 106   Ht 5\' 6"  (1.676 m)   Wt 221 lb (100.2 kg)   SpO2 95%   BMI 35.67 kg/m    Wt Readings from Last 3 Encounters:  01/19/23 221 lb (100.2 kg)  09/03/22 234 lb (106.1 kg)  05/14/22 234 lb 9.6 oz (106.4 kg)    GEN: Well nourished, well developed in no acute distress NECK: No JVD; No carotid bruits CARDIAC: IR IR, II/VI systolic murmur best heard at the RUSB without rubs or gallops RESPIRATORY:  Clear with diminished  bases to auscultation without rales, wheezing or rhonchi Respirations are unlabored at rest on room air. ABDOMEN: Soft, non-tender, non-distended EXTREMITIES:  1+ edema to BLE; No deformity   ASSESSMENT AND PLAN: .   Paroxysmal atrial fibrillation where she was found to be in atrial for but today as her EKG reveals rate controlled atrial fibrillation with a rate of 100-106.  She states that she does have occasional palpitations that she notices in the evening when she lies down.  She has recently followed up with her primary care provider who had just ordered repeat labs and increased her Toprol-XL dosing from 25 mg daily to 75 mg daily for rate control.  This was just completed last week.  No further dose adjustments were made today.  She is continued on apixaban 5 mg twice daily for CHA2DS2-VASc score of at least 3 for stroke prophylaxis.  She does not meet dose reduction.  Last hemoglobin was 10.9 off of recent labs with her PCP.  She is requesting a refill for Toprol and her apixaban  today.  A message will be sent to Coumadin clinic pharmacist for refill of her apixaban.  Upon further review of her labs it was noted that her TSH was extremely low.  We also did discuss if she continues to be symptomatic with dyspnea, palpitations, and peripheral edema even on her diuretic she may require cardioversion.  Coronary artery disease without angina.  She had moderate disease by prior catheterization in 2006.  Prior echocardiogram completed 08/2021 revealed no wall motion abnormalities.  She has been without any further chest discomfort.  She is continued on Toprol-XL and rosuvastatin.  She is also continued on apixaban and lieu of aspirin.  Aortic regurgitation which was moderate by echocardiogram.  Most recent echocardiogram was completed 06/13/2022 with an LVEF of 65-70%, and R WMA, G1 DD, trivial mitral valve regurgitation, mild aortic regurgitation.  Continue with surveillance studies.  Carotid artery  disease with Dopplers completed 08/2021 which revealed minimal heterogeneous and calcified plaque with no hemodynamically significant stenosis by duplex criteria.  She is continued on rosuvastatin 40 mg daily and apixaban and lieu of aspirin.  Mixed hyperlipidemia with an LDL of 95 on 11/2021.  She is continued on rosuvastatin 40 mg daily.  Requesting her most recent labs from her PCP.  She remains elevated above 70 may benefit from ezetimibe 10 mg daily.       Dispo: Patient return to clinic to see MD/APP in 3 to 4 weeks for reevaluation of symptoms or sooner if needed  Signed, Ziair Penson, NP

## 2023-01-22 ENCOUNTER — Ambulatory Visit (HOSPITAL_COMMUNITY)
Admission: RE | Admit: 2023-01-22 | Discharge: 2023-01-22 | Disposition: A | Discharge status: Home / Self Care | Payer: 59 | Source: Ambulatory Visit | Attending: Internal Medicine | Admitting: Internal Medicine

## 2023-01-22 DIAGNOSIS — R7989 Other specified abnormal findings of blood chemistry: Secondary | ICD-10-CM | POA: Insufficient documentation

## 2023-01-22 DIAGNOSIS — I700 Atherosclerosis of aorta: Secondary | ICD-10-CM | POA: Diagnosis not present

## 2023-02-20 ENCOUNTER — Encounter: Payer: Self-pay | Admitting: Student

## 2023-02-20 ENCOUNTER — Ambulatory Visit: Payer: 59 | Attending: Student | Admitting: Student

## 2023-02-20 ENCOUNTER — Other Ambulatory Visit (HOSPITAL_COMMUNITY)
Admission: RE | Admit: 2023-02-20 | Discharge: 2023-02-20 | Disposition: A | Discharge status: Home / Self Care | Payer: 59 | Source: Ambulatory Visit | Attending: Student | Admitting: Student

## 2023-02-20 VITALS — BP 136/65 | HR 100 | Ht 66.0 in | Wt 215.4 lb

## 2023-02-20 DIAGNOSIS — B372 Candidiasis of skin and nail: Secondary | ICD-10-CM | POA: Diagnosis not present

## 2023-02-20 DIAGNOSIS — I480 Paroxysmal atrial fibrillation: Secondary | ICD-10-CM

## 2023-02-20 DIAGNOSIS — R7989 Other specified abnormal findings of blood chemistry: Secondary | ICD-10-CM | POA: Insufficient documentation

## 2023-02-20 DIAGNOSIS — I351 Nonrheumatic aortic (valve) insufficiency: Secondary | ICD-10-CM | POA: Diagnosis not present

## 2023-02-20 DIAGNOSIS — I2510 Atherosclerotic heart disease of native coronary artery without angina pectoris: Secondary | ICD-10-CM

## 2023-02-20 DIAGNOSIS — I6523 Occlusion and stenosis of bilateral carotid arteries: Secondary | ICD-10-CM

## 2023-02-20 LAB — TSH: TSH: <0.01 u[IU]/mL — ABNORMAL LOW (ref 0.350–4.500)

## 2023-02-20 LAB — T4, FREE: Free T4: 3.95 ng/dL — ABNORMAL HIGH (ref 0.61–1.12)

## 2023-02-20 MED ORDER — NYSTATIN 100000 UNIT/GM EX OINT: 1.0000 | TOPICAL_OINTMENT | Freq: Two times a day (BID) | CUTANEOUS | 0 refills | Status: AC

## 2023-02-20 MED ORDER — METOPROLOL SUCCINATE ER 100 MG PO TB24: 100.0000 mg | ORAL_TABLET | Freq: Every day | ORAL | 3 refills | Status: DC

## 2023-02-20 NOTE — Progress Notes (Signed)
Cardiology Office Note    Date:  02/20/2023  ID:  SHAELA BOER, DOB Oct 02, 1955, MRN [id number] Cardiologist: Nona Dell, MD    History of Present Illness:    Debra Graves is a 68 y.o. female with past medical history of CAD (moderate RCA disease by prior cardiac catheterization in 2006), paroxysmal atrial fibrillation, carotid artery plaque, aortic valve insufficiency and HLD who presents to the office today for 1 month follow-up.  She was examined by Charlsie Quest, NP in 12/2022 and had recently been sick with coughing and congestion but denied any recent exertional chest pain. She had been evaluated by her PCP and told she was back in atrial fibrillation and EKG at the time of her office visit confirmed this. She did report occasional palpitations and Toprol-XL had just been increased from 25 mg daily to 75 mg daily (was actually Lopressor). Labs had also recently been checked by her PCP and TSH was significantly low at less than 0.005. She was continued on Eliquis for anticoagulation and follow-up was recommended in 3 to 4 weeks for reassessment as she may require DCCV.  In talking with the patient today, she reports feeling jittery at times and also has intermittent diaphoresis and has experienced a 20+ pound unintentional weight loss over the past few months. Her TSH was previously significantly low as discussed above and she reports this has not been rechecked by her PCP or any providers in the interim. No known thyroid issues prior to this.  She reports still having occasional palpitations and has not checked her heart rate at home. No specific exertional chest pain or dyspnea on exertion. She does experience intermittent lower extremity edema and takes Lasix for this. No specific orthopnea or PND.  Studies Reviewed:   EKG: EKG is ordered today and demonstrates:   EKG Interpretation Date/Time:  Friday February 20 2023 14:41:50 EST Ventricular Rate:  100 PR Interval:    QRS  Duration:  84 QT Interval:  374 QTC Calculation: 482 R Axis:   -3  Text Interpretation: Atrial fibrillation Nonspecific ST and T wave abnormality Confirmed by Randall An ([id number]) on 02/20/2023 2:45:44 PM       Event Monitor: 08/2021 ZIO XT reviewed.  5 days, 21 hours analyzed.   Predominant rhythm is sinus with heart rate ranging from 56 bpm up to 109 bpm and average heart rate 69 bpm. Paroxysmal atrial fibrillation was documented, overall rhythm burden of only 4% however.  Average heart rate during events was 98 bpm with limited rapid ventricular response. There were rare PACs including atrial couplets and triplets representing less than 1% total beats. There were rare PVCs representing less than 1% total beats. No pauses.  Echocardiogram: 05/2022 IMPRESSIONS     1. Left ventricular ejection fraction, by estimation, is 65 to 70%. Left  ventricular ejection fraction by PLAX is 68 %. The left ventricle has  normal function. The left ventricle has no regional wall motion  abnormalities. Left ventricular diastolic  parameters are consistent with Grade I diastolic dysfunction (impaired  relaxation).   2. Right ventricular systolic function is low normal. The right  ventricular size is normal.   3. The mitral valve is grossly normal. Trivial mitral valve  regurgitation.   4. The aortic valve is tricuspid. Aortic valve regurgitation is mild.  Aortic valve sclerosis is present, with no evidence of aortic valve  stenosis. Aortic regurgitation PHT measures 504 msec.   5. The inferior vena cava is normal in size  with greater than 50%  respiratory variability, suggesting right atrial pressure of 3 mmHg.   Comparison(s): Changes from prior study are noted. 08/27/2021: LVEF 60-65%,  moderate AI.    Risk Assessment/Calculations:    CHA2DS2-VASc Score = 3   This indicates a 3.2% annual risk of stroke. The patient's score is based upon: CHF History: 0 HTN History: 0 Diabetes  History: 0 Stroke History: 0 Vascular Disease History: 1 Age Score: 1 Gender Score: 1   Physical Exam:   VS:  BP 136/65   Pulse 100   Ht 5\' 6"  (1.676 m)   Wt 215 lb 6.4 oz (97.7 kg)   SpO2 91%   BMI 34.77 kg/m    Wt Readings from Last 3 Encounters:  02/20/23 215 lb 6.4 oz (97.7 kg)  01/19/23 221 lb (100.2 kg)  09/03/22 234 lb (106.1 kg)     GEN: Well nourished, well developed female appearing in no acute distress NECK: No JVD; No carotid bruits CARDIAC: Irregular irregular, no murmurs, rubs, gallops RESPIRATORY:  Clear to auscultation without rales, wheezing or rhonchi  ABDOMEN: Appears non-distended. No obvious abdominal masses. She does have an area that appears "beefy red" underneath her abdominal skin fold near her umbilicus. EXTREMITIES: No clubbing or cyanosis. No edema.  Distal pedal pulses are 2+ bilaterally.   Assessment and Plan:   1. Paroxysmal Atrial Fibrillation - She does report intermittent palpitations and she remains in atrial fibrillation today. I am concerned that hyperthyroidism could be the culprit for her persistent arrhythmia given her significantly abnormal TSH in 12/2022. This has not been followed up on in the interim, therefore will recheck TSH, Free T3 and T4 today. If still hyperthyroid, would refer to Endocrinology as discussed below. Reviewed with the patient that attempts to restore normal sinus rhythm would likely be of low yield given hyperthyroidism. Could readdress afterwards. Given her elevated heart rate and intermittent palpitations, will adjust Lopressor 75 mg once daily to Toprol-XL 100 mg daily for sustained release. May ultimately need to switch Toprol-XL to Propranolol if found to have hyperthyroidism. - Hemoglobin was at 10.9 when checked in 12/2022. Remains on Eliquis 5 mg twice daily for anticoagulation. Will recheck a CBC today with repeat labs.  2. CAD/HLD - She had moderate RCA disease by prior cardiac catheterization in 2006.  Denies any recent anginal symptoms and echocardiogram last year showed a preserved EF of 65 to 70% with no regional wall motion abnormalities. She is not on ASA given the need for anticoagulation. Remains on Crestor 40 mg daily. LDL was previously 84 when checked in 05/2022 and labs in 12/2022 showed AST and ALT were elevated at 42 and 34. LFT's need to be rechecked in the future. If LDL remains above goal, would add Zetia.  3. Aortic Regurgitation - This was mild by echocardiogram in 05/2022. Would plan for follow-up imaging in the next 2 to 3 years.  4. Low TSH - TSH was < 0.005 when checked by her PCP in 12/2022 and her clinical picture is concerning for hyperthyroidism. Will recheck labs today to include TSH, Free T3 and Free T4 with plans to refer to Endocrinology pending results.   5. Carotid Plaque - Prior dopplers in 08/2021 showed minimal plaque with no hemodynamically significant stenosis.  6. Skin Yeast Infection - Examined the patient's abdomen at her request and she does have an area of "beefy redness" near her umbilicus. Appears most consistent with a yeast infection of the skin. Will prescribe Nystatin ointment.  Encouraged her to follow-up with her PCP if symptoms do not improve.  Signed, Ellsworth Lennox, PA-C

## 2023-02-20 NOTE — Patient Instructions (Addendum)
Medication Instructions:   STOP Lopressor  START Toprol XL 100 mg daily  Labwork: TSH,Free T3,Free T4  Testing/Procedures: None today  Follow-Up: 6-8 weeks  Any Other Special Instructions Will Be Listed Below (If Applicable).  If you need a refill on your cardiac medications before your next appointment, please call your pharmacy.

## 2023-02-22 LAB — T3, FREE: T3, Free: 16.9 pg/mL — ABNORMAL HIGH (ref 2.0–4.4)

## 2023-02-23 ENCOUNTER — Telehealth: Payer: Self-pay

## 2023-02-23 DIAGNOSIS — E0590 Thyrotoxicosis, unspecified without thyrotoxic crisis or storm: Secondary | ICD-10-CM

## 2023-02-23 NOTE — Telephone Encounter (Signed)
-----   Message from Ellsworth Lennox sent at 02/22/2023  5:52 PM EST ----- Please let the patient know her labs are consistent with hyperthyroidism. Given her significant symptoms (weight loss, diaphoresis, fatigue, etc.) and persistent atrial fibrillation, would recommend urgent referral to Endocrinology. Her hyperthyroidism needs to be addressed prior to pursuing a cardioversion for atrial fibrillation as this is likely to recur given her hyperthyroidism. Can enter referral to [facility] (she preferred to see Ronny Bacon, NP if able). Thanks!

## 2023-02-23 NOTE — Telephone Encounter (Signed)
Attempt to reach patient,no answer,voicemail full Alternate contact,brothers number is not in service Urgent referral placed to endocrinology

## 2023-02-26 NOTE — Telephone Encounter (Signed)
 Hi I have called this patient a few times to get her in asap. She is not answering, NO VM set up. The emergency contact number is not working. Please Advise.

## 2023-02-26 NOTE — Telephone Encounter (Signed)
 Patient had given me brothers number and apparently not correct.  Letter mailed to patient, B.Strader,PA-C aware.

## 2023-03-04 ENCOUNTER — Ambulatory Visit: Payer: 59 | Admitting: Nurse Practitioner

## 2023-03-05 ENCOUNTER — Encounter: Payer: Self-pay | Admitting: Nurse Practitioner

## 2023-03-05 ENCOUNTER — Ambulatory Visit (INDEPENDENT_AMBULATORY_CARE_PROVIDER_SITE_OTHER): Payer: 59 | Admitting: Nurse Practitioner

## 2023-03-05 VITALS — BP 136/66 | HR 111 | Ht 66.0 in | Wt 215.0 lb

## 2023-03-05 DIAGNOSIS — E0590 Thyrotoxicosis, unspecified without thyrotoxic crisis or storm: Secondary | ICD-10-CM

## 2023-03-05 MED ORDER — METHIMAZOLE 10 MG PO TABS: 10.0000 mg | ORAL_TABLET | Freq: Two times a day (BID) | ORAL | 3 refills | Status: DC

## 2023-03-05 NOTE — Progress Notes (Signed)
03/05/2023     Endocrinology Consult Note    Subjective:    Patient ID: Debra Graves, female    DOB: 1955/12/26, PCP Elfredia Nevins, MD.   Past Medical History:  Diagnosis Date   Adrenal insufficiency [facility])    Anxiety    Aortic insufficiency    Arthritis    Back pain    CAD (coronary artery disease)    Moderate ostial RCA disease at cardiac catheterization 2006   Constipation    Depression    Fibroids    Hypercholesteremia    Mild carotid artery disease (HCC)    Rectocele     Past Surgical History:  Procedure Laterality Date   CARDIAC CATHETERIZATION  03/08/2004   mild progressive CAD   carotid duplex  03/09/2007   rt subclavian 0 to 49%,right bulb 0 to 49 %,lf ICA normal   COLONOSCOPY N/A 06/16/2013   Dr. Jena Gauss: hyperplastic polyps, melanosis coli    DOPPLER ECHOCARDIOGRAPHY  03/20/2010   EF >55 %,trace mitral regurg,mild tricuspid regurg,aortic valve mildly sclerotic'mild to moderate aortic regurg,trace pulmonic valver regurg   NM MYOCAR PERF WALL MOTION  03/19/06   EF 61%,low risk study    Social History   Socioeconomic History   Marital status: Divorced    Spouse name: Not on file   Number of children: Not on file   Years of education: Not on file   Highest education level: Not on file  Occupational History   Occupation: disability    Employer: UNEMPLOYED  Tobacco Use   Smoking status: Former    Current packs/day: 0.50    Average packs/day: 0.5 packs/day for 41.0 years (20.5 ttl pk-yrs)    Types: Cigarettes   Smokeless tobacco: Never  Vaping Use   Vaping status: Never Used  Substance and Sexual Activity   Alcohol use: No   Drug use: No   Sexual activity: Not Currently    Birth control/protection: None, Post-menopausal    Comment: once in 9 months  Other Topics Concern   Not on file  Social History Narrative   Drinks 3 cups of coffee daily.   Social Drivers of Corporate investment banker Strain: Not on file  Food Insecurity: Not  on file  Transportation Needs: Not on file  Physical Activity: Not on file  Stress: Not on file  Social Connections: Not on file    Family History  Problem Relation Age of Onset   Cancer Mother 50       Breast then bones and all over   Coronary artery disease Father    Heart disease Father    Stroke Father    Hypertension Sister    Breast cancer Sister 75   Heart disease Sister    Stroke Brother    Coronary artery disease Brother    Coronary artery disease Brother    Coronary artery disease Brother    Colon cancer Neg Hx     Outpatient Encounter Medications as of 03/05/2023  Medication Sig   clonazePAM (KLONOPIN) 0.5 MG tablet Take 0.5 mg by mouth daily.   Docusate Calcium (STOOL SOFTENER PO) Take 2 tablets by mouth daily.   DULoxetine (CYMBALTA) 60 MG capsule Take 60 mg by mouth daily.   ELIQUIS 5 MG TABS tablet Take 1 tablet (5 mg total) by mouth 2 (two) times daily.   esomeprazole (NEXIUM) 20 MG capsule Take 1 capsule (20 mg total) by mouth 2 (two) times daily before a meal. Office visit  needed for additional refills.   fish oil-omega-3 fatty acids 1000 MG capsule Take 2 g by mouth daily.   furosemide (LASIX) 20 MG tablet Take 20 mg by mouth 2 (two) times daily.   hydrocortisone (CORTEF) 10 MG tablet Take 10 mg by mouth 2 (two) times daily.   LORazepam (ATIVAN) 1 MG tablet Take 1 mg by mouth every 6 (six) hours as needed for anxiety.   meloxicam (MOBIC) 7.5 MG tablet Take 7.5 mg by mouth 2 (two) times daily.   methimazole (TAPAZOLE) 10 MG tablet Take 1 tablet (10 mg total) by mouth 2 (two) times daily.   metoprolol succinate (TOPROL-XL) 100 MG 24 hr tablet Take 1 tablet (100 mg total) by mouth daily. Take with or immediately following a meal.   rosuvastatin (CRESTOR) 40 MG tablet TAKE 1 TABLET BY MOUTH ONCE DAILY.   Vitamin D, Ergocalciferol, 2000 units CAPS Take 1 capsule by mouth daily.   cyclobenzaprine (FLEXERIL) 5 MG tablet Take 1 tablet (5 mg total) by mouth 2 (two)  times daily as needed for muscle spasms. Do not drink alcohol or drive while taking this medication.  May cause drowsiness. (Patient not taking: Reported on 03/05/2023)   tiZANidine (ZANAFLEX) 4 MG tablet Take 4 mg by mouth every 6 (six) hours as needed. (Patient not taking: Reported on 03/05/2023)   No facility-administered encounter medications on file as of 03/05/2023.    ALLERGIES: Allergies  Allergen Reactions   Albuterol    Bactrim [Sulfamethoxazole-Trimethoprim]    Vytorin [Ezetimibe-Simvastatin]     Myalgias     VACCINATION STATUS:  There is no immunization history on file for this patient.   HPI  Debra Graves is 68 y.o. female who presents today with a medical history as above. she is being seen in consultation for hyperthyroidism requested by her cardiologist Randall An, PA as she is being seen for persistent Afib.  she has been dealing with symptoms of unexplained weight loss, fatigue, heart palpitations, shortness of breath, tremors, diarrhea, muscle weakness, and anxiety for about 5-6 months. These symptoms are progressively worsening and troubling to her.  her most recent thyroid labs revealed suppressed TSH of < 0.01, elevated FT4 of 3.95 and elevated FT3 of 16.9 on 02/20/23.  she denies dysphagia, choking, shortness of breath, no recent voice change.    she does have family history of thyroid dysfunction in her sister and niece, but denies family hx of thyroid cancer. she denies personal history of goiter. she is not on any anti-thyroid medications nor on any thyroid hormone supplements. Denies use of Biotin containing supplements.  she is willing to proceed with appropriate work up and therapy for thyrotoxicosis.   Review of systems  Constitutional: + rapidly decreasing body weight, current Body mass index is 34.7 kg/m., + fatigue, no subjective hyperthermia, no subjective hypothermia Eyes: no blurry vision, no xerophthalmia ENT: no sore throat, no nodules  palpated in throat, no dysphagia/odynophagia, no hoarseness Cardiovascular: no chest pain, + shortness of breath, + palpitations, no leg swelling Respiratory: no cough, + shortness of breath Gastrointestinal: no nausea/vomiting, + diarrhea Musculoskeletal: + muscle weakness Skin: no rashes, no hyperemia Neurological: + tremors, no numbness, no tingling, no dizziness Psychiatric: no depression, + anxiety   Objective:    BP 136/66 (BP Location: Left Arm, Patient Position: Sitting, Cuff Size: Large)   Pulse (!) 111   Ht 5\' 6"  (1.676 m)   Wt 215 lb (97.5 kg) Comment: Patient in Wheelchair , did not want to  stand , reports that her last weight was 215.0lbs  BMI 34.70 kg/m   Wt Readings from Last 3 Encounters:  03/05/23 215 lb (97.5 kg)  02/20/23 215 lb 6.4 oz (97.7 kg)  01/19/23 221 lb (100.2 kg)     BP Readings from Last 3 Encounters:  03/05/23 136/66  02/20/23 136/65  01/19/23 (!) 102/55                          Physical Exam- Limited  Constitutional:  Body mass index is 34.7 kg/m. , not in acute distress, ANXIOUS state of mind Eyes:  EOMI, no exophthalmos Neck: Supple Thyroid: No gross goiter, no palpable nodularity Cardiovascular: irregular rhythm with elevated rate, no murmurs, rubs, or gallops, no edema Respiratory: SOB at rest, no crackles, rales, rhonchi, or wheezing Musculoskeletal: no gross deformities, strength intact in all four extremities, no gross restriction of joint movements, in clinic WC today for weakness Skin:  no rashes, no hyperemia Neurological: ++ tremor with outstretched hands, DTR normal in BLE   CMP     Component Value Date/Time   NA 135 01/15/2016 1404   NA 143 04/09/2015 0957   K 4.3 01/15/2016 1404   CL 101 01/15/2016 1404   CO2 27 01/15/2016 1404   GLUCOSE 114 (H) 01/15/2016 1404   BUN 17 01/15/2016 1404   BUN 12 04/09/2015 0957   CREATININE 0.70 05/06/2016 0916   CREATININE 0.93 03/20/2014 0851   CALCIUM 9.2 01/15/2016 1404    PROT 7.0 01/15/2016 1404   PROT 6.3 04/09/2015 0957   ALBUMIN 4.2 01/15/2016 1404   ALBUMIN 4.1 04/09/2015 0957   AST 19 01/15/2016 1404   ALT 15 01/15/2016 1404   ALKPHOS 45 01/15/2016 1404   BILITOT 0.5 01/15/2016 1404   BILITOT 0.4 04/09/2015 0957   GFRNONAA >60 01/15/2016 1404     CBC    Component Value Date/Time   WBC 7.5 01/15/2016 1404   RBC 4.42 01/15/2016 1404   HGB 14.0 01/15/2016 1404   HCT 41.4 01/15/2016 1404   PLT 259 01/15/2016 1404   MCV 93.7 01/15/2016 1404   MCH 31.7 01/15/2016 1404   MCHC 33.8 01/15/2016 1404   RDW 11.9 01/15/2016 1404     Diabetic Labs (most recent): No results found for: "HGBA1C", "MICROALBUR"  Lipid Panel     Component Value Date/Time   CHOL 166 04/09/2015 0957   TRIG 79 04/09/2015 0957   HDL 76 04/09/2015 0957   CHOLHDL 2.6 03/20/2014 0851   VLDL 23 03/20/2014 0851   LDLCALC 74 04/09/2015 0957   LABVLDL 16 04/09/2015 0957     Lab Results  Component Value Date   TSH <0.010 (L) 02/20/2023   TSH 0.01 (A) 01/08/2023   TSH 3.39 06/13/2022   TSH 2.96 08/14/2021   FREET4 3.95 (H) 02/20/2023        Assessment & Plan:   1. Hyperthyroidism (Primary)  she is being seen at a kind request of Elfredia Nevins, MD.  her history and most recent labs are reviewed, and she was examined clinically. Subjective and objective findings are consistent with thyrotoxicosis likely from primary hyperthyroidism. The potential risks of untreated thyrotoxicosis and the need for definitive therapy have been discussed in detail with her, and she agrees to proceed with diagnostic workup and treatment plan.   I will repeat full profile thyroid function tests in 6 weeks (including antibody testing to assess for autoimmune thyroid dysfunction).  Given her gross symptoms  of over-production, will initiate Methimazole 10 mg po twice daily for now.  Will hold off on uptake and scan until she is more stable state to come off medications to do so.   She  is already on a beta-blocker which may help with some hyperactive thyroid symptoms.    -Patient is advised to maintain close follow up with Elfredia Nevins, MD for primary care needs.   - Time spent with the patient: 60 minutes, of which >50% was spent in obtaining information about her symptoms, reviewing her previous labs, evaluations, and treatments, counseling her about her hyperthyroidism , and developing a plan to confirm the diagnosis and long term treatment as necessary. Please refer to "Patient Self Inventory" in the Media tab for reviewed elements of pertinent patient history.  Debra Graves participated in the discussions, expressed understanding, and voiced agreement with the above plans.  All questions were answered to her satisfaction. she is encouraged to contact clinic should she have any questions or concerns prior to her return visit.   Follow up plan: Return in about 6 weeks (around 04/16/2023) for Thyroid follow up, Previsit labs.   Thank you for involving me in the care of this pleasant patient, and I will continue to update you with her progress.    Ronny Bacon, [facility] [address] [place], [place] [address] Phone: [phone] Fax: [phone]  03/05/2023, 1:57 PM

## 2023-03-05 NOTE — Patient Instructions (Signed)
Hyperthyroidism Hyperthyroidism refers to a thyroid gland that is too active or overactive. The thyroid gland is a small gland located in the lower front part of the neck, just in front of the windpipe (trachea). This gland makes hormones that: Help control how the body uses food for energy (metabolism). Help the heart and brain work well. Keep your bones strong. When the thyroid is overactive, it produces too much of a hormone called thyroxine. What are the causes? This condition may be caused by: Graves' disease. This is a disorder in which the body's disease-fighting system (immune system) attacks the thyroid gland. This is the most common cause. Inflammation of the thyroid gland. A tumor in the thyroid gland. Use of certain medicines, including: Prescription thyroid hormone replacement. Herbal supplements that mimic thyroid hormones. Amiodarone therapy. Solid or fluid-filled lumps within your thyroid gland (thyroid nodules). Taking in a large amount of iodine from foods or medicines. What increases the risk? You are more likely to develop this condition if: You are female. You have a family history of thyroid conditions. You smoke tobacco. You use a medicine called lithium. You take medicines that affect the immune system (immunosuppressants). What are the signs or symptoms? Symptoms of this condition include: Nervousness. Inability to tolerate heat. Diarrhea. Rapid heart rate. Shaky hands. Restlessness. Sleep problems. Other symptoms may include: Heart skipping beats or making extra beats. Unexplained weight loss. Change in the texture of hair or skin. Loss of menstruation. Fatigue. Enlarged thyroid gland or a lump in the thyroid (nodule). You may also have symptoms of Graves' disease, which may include: Protruding eyes. Dry eyes. Red or swollen eyes. Problems with vision. How is this diagnosed? This condition may be diagnosed based on: Your symptoms and medical  history. A physical exam. Blood tests. Thyroid ultrasound. This test involves using sound waves to produce images of the thyroid gland. A thyroid scan. A radioactive substance is injected into a vein, and images show how much iodine is present in the thyroid. Radioactive iodine uptake test (RAIU). A small amount of radioactive iodine is given by mouth to see how much iodine the thyroid absorbs after a certain amount of time. How is this treated? Treatment depends on the cause and severity of the condition. Treatment may include: Medicines to reduce the amount of thyroid hormone your body makes. Medicines to help manage your symptoms. Radioactive iodine treatment (radioiodine therapy). This involves swallowing a small dose of radioactive iodine, in capsule or liquid form, to kill thyroid cells. Surgery to remove part or all of your thyroid gland. You may need to take thyroid hormone replacement medicine for the rest of your life after thyroid surgery. Follow these instructions at home:  Take over-the-counter and prescription medicines only as told by your health care provider. Do not use any products that contain nicotine or tobacco. These products include cigarettes, chewing tobacco, and vaping devices, such as e-cigarettes. If you need help quitting, ask your health care provider. Follow any instructions from your health care provider about diet. You may be instructed to limit foods that contain iodine. Keep all follow-up visits. You will need to have blood tests regularly so that your health care provider can monitor your condition. Where to find more information [employer] of Diabetes and Digestive and Kidney Diseases: [email or web address] Contact a health care provider if: Your symptoms do not get better with treatment. You have a fever. You have abdominal pain. You feel dizzy. You are taking thyroid hormone replacement medicine and: You have  symptoms of depression. You feel like you  are tired all the time. You gain weight. Get help right away if: You have sudden, unexplained confusion or other mental changes. You have chest pain. You have fast or irregular heartbeats (palpitations). You have difficulty breathing. These symptoms may be an emergency. Get help right away. Call 911. Do not wait to see if the symptoms will go away. Do not drive yourself to the hospital. Summary The thyroid gland is a small gland located in the lower front part of the neck, just in front of the windpipe. Hyperthyroidism is when the thyroid gland is too active and produces too much of a hormone called thyroxine. The most common cause is Graves' disease, a disorder in which your immune system attacks the thyroid gland. Hyperthyroidism can cause various symptoms, such as unexplained weight loss, nervousness, inability to tolerate heat, or changes in your heartbeat. Treatment may include medicine to reduce the amount of thyroid hormone your body makes, radioiodine therapy, surgery, or medicines to manage symptoms. This information is not intended to replace advice given to you by your health care provider. Make sure you discuss any questions you have with your health care provider. Document Revised: 03/01/2021 Document Reviewed: 03/01/2021 Elsevier Patient Education  2024 [employer].

## 2023-03-20 ENCOUNTER — Telehealth: Payer: Self-pay | Admitting: Student

## 2023-03-20 ENCOUNTER — Other Ambulatory Visit (HOSPITAL_COMMUNITY): Payer: 59

## 2023-03-20 ENCOUNTER — Observation Stay (HOSPITAL_COMMUNITY)
Admission: EM | Admit: 2023-03-20 | Discharge: 2023-03-20 | Disposition: A | Discharge status: Home / Self Care | Payer: 59 | Attending: Internal Medicine | Admitting: Internal Medicine

## 2023-03-20 ENCOUNTER — Other Ambulatory Visit: Payer: Self-pay

## 2023-03-20 ENCOUNTER — Emergency Department (HOSPITAL_COMMUNITY): Payer: 59

## 2023-03-20 ENCOUNTER — Encounter (HOSPITAL_COMMUNITY): Payer: Self-pay

## 2023-03-20 DIAGNOSIS — I10 Essential (primary) hypertension: Secondary | ICD-10-CM | POA: Diagnosis not present

## 2023-03-20 DIAGNOSIS — R000 Tachycardia, unspecified: Secondary | ICD-10-CM | POA: Diagnosis not present

## 2023-03-20 DIAGNOSIS — R7989 Other specified abnormal findings of blood chemistry: Secondary | ICD-10-CM | POA: Diagnosis not present

## 2023-03-20 DIAGNOSIS — I2510 Atherosclerotic heart disease of native coronary artery without angina pectoris: Secondary | ICD-10-CM | POA: Insufficient documentation

## 2023-03-20 DIAGNOSIS — R778 Other specified abnormalities of plasma proteins: Secondary | ICD-10-CM | POA: Diagnosis not present

## 2023-03-20 DIAGNOSIS — E876 Hypokalemia: Secondary | ICD-10-CM | POA: Diagnosis not present

## 2023-03-20 DIAGNOSIS — R030 Elevated blood-pressure reading, without diagnosis of hypertension: Secondary | ICD-10-CM | POA: Diagnosis not present

## 2023-03-20 DIAGNOSIS — Z87891 Personal history of nicotine dependence: Secondary | ICD-10-CM | POA: Insufficient documentation

## 2023-03-20 DIAGNOSIS — R531 Weakness: Secondary | ICD-10-CM | POA: Diagnosis not present

## 2023-03-20 DIAGNOSIS — R519 Headache, unspecified: Secondary | ICD-10-CM | POA: Diagnosis not present

## 2023-03-20 DIAGNOSIS — R600 Localized edema: Secondary | ICD-10-CM | POA: Insufficient documentation

## 2023-03-20 LAB — COMPREHENSIVE METABOLIC PANEL
ALT: 15 U/L (ref 0–44)
AST: 20 U/L (ref 15–41)
Albumin: 3 g/dL — ABNORMAL LOW (ref 3.5–5.0)
Alkaline Phosphatase: 50 U/L (ref 38–126)
Anion gap: 8 (ref 5–15)
BUN: 9 mg/dL (ref 8–23)
CO2: 22 mmol/L (ref 22–32)
Calcium: 8.6 mg/dL — ABNORMAL LOW (ref 8.9–10.3)
Chloride: 107 mmol/L (ref 98–111)
Creatinine, Ser: 0.73 mg/dL (ref 0.44–1.00)
GFR, Estimated: >60 mL/min (ref >60)
Glucose, Bld: 106 mg/dL — ABNORMAL HIGH (ref 70–99)
Potassium: 2.9 mmol/L — ABNORMAL LOW (ref 3.5–5.1)
Sodium: 137 mmol/L (ref 135–145)
Total Bilirubin: 0.5 mg/dL (ref 0.0–1.2)
Total Protein: 6 g/dL — ABNORMAL LOW (ref 6.5–8.1)

## 2023-03-20 LAB — CBC
HCT: 37.9 % (ref 36.0–46.0)
Hemoglobin: 12.5 g/dL (ref 12.0–15.0)
MCH: 28.7 pg (ref 26.0–34.0)
MCHC: 33 g/dL (ref 30.0–36.0)
MCV: 86.9 fL (ref 80.0–100.0)
Platelets: 251 10*3/uL (ref 150–400)
RBC: 4.36 MIL/uL (ref 3.87–5.11)
RDW: 13.2 % (ref 11.5–15.5)
WBC: 8.8 10*3/uL (ref 4.0–10.5)
nRBC: 0 % (ref 0.0–0.2)

## 2023-03-20 LAB — T4, FREE: Free T4: 0.61 ng/dL (ref 0.61–1.12)

## 2023-03-20 LAB — TSH: TSH: <0.01 u[IU]/mL — ABNORMAL LOW (ref 0.350–4.500)

## 2023-03-20 LAB — PROTIME-INR
INR: 1.1 (ref 0.8–1.2)
Prothrombin Time: 14.5 s (ref 11.4–15.2)

## 2023-03-20 LAB — TROPONIN I (HIGH SENSITIVITY)
Troponin I (High Sensitivity): 21 ng/L — ABNORMAL HIGH (ref <18)
Troponin I (High Sensitivity): 30 ng/L — ABNORMAL HIGH (ref <18)

## 2023-03-20 LAB — MAGNESIUM: Magnesium: 1.6 mg/dL — ABNORMAL LOW (ref 1.7–2.4)

## 2023-03-20 LAB — BRAIN NATRIURETIC PEPTIDE: B Natriuretic Peptide: 399 pg/mL — ABNORMAL HIGH (ref 0.0–100.0)

## 2023-03-20 MED ORDER — POTASSIUM CHLORIDE CRYS ER 20 MEQ PO TBCR
40.0000 meq | EXTENDED_RELEASE_TABLET | Freq: Once | ORAL | Status: AC
Start: 2023-03-20 — End: 2023-03-20
  Administered 2023-03-20: 40 meq via ORAL
  Filled 2023-03-20: qty 2

## 2023-03-20 MED ORDER — POTASSIUM CHLORIDE 10 MEQ/100ML IV SOLN
10.0000 meq | INTRAVENOUS | Status: AC
  Administered 2023-03-20: 10 meq via INTRAVENOUS
  Filled 2023-03-20 (×2): qty 100

## 2023-03-20 MED ORDER — MAGNESIUM SULFATE 2 GM/50ML IV SOLN
2.0000 g | Freq: Once | INTRAVENOUS | Status: DC
  Filled 2023-03-20: qty 50

## 2023-03-20 MED ORDER — LORAZEPAM 1 MG PO TABS
1.0000 mg | ORAL_TABLET | Freq: Once | ORAL | Status: AC
  Administered 2023-03-20: 1 mg via ORAL
  Filled 2023-03-20: qty 1

## 2023-03-20 MED ORDER — MAGNESIUM OXIDE -MG SUPPLEMENT 400 (240 MG) MG PO TABS
800.0000 mg | ORAL_TABLET | Freq: Once | ORAL | Status: AC
  Administered 2023-03-20: 800 mg via ORAL
  Filled 2023-03-20: qty 2

## 2023-03-20 MED ORDER — ACETAMINOPHEN 500 MG PO TABS
1000.0000 mg | ORAL_TABLET | Freq: Once | ORAL | Status: AC
  Administered 2023-03-20: 1000 mg via ORAL
  Filled 2023-03-20: qty 2

## 2023-03-20 NOTE — ED Notes (Signed)
XR and phleb at bedside  

## 2023-03-20 NOTE — Consult Note (Signed)
 Patient seen and evaluated in the emergency department.  EDP was discussing need for admission in regards to electrolyte abnormalities as well as worsening anxiety in the setting of hyperthyroidism.  Noted to have some BNP elevation and some lower extremity edema with noncompliance of her home Lasix.  She was given some potassium supplementation in the ED and plan was for admission to evaluate for heart failure and to achieve better control of her anxiety as well as correction of her electrolytes.   Unfortunately, patient has decided to leave AMA despite my encouraging her to remain in the hospital.  She is alert and oriented x 3 and understands the consequences of her decision.  I have encouraged her to follow-up with her PCP/cardiologist as soon as possible for further evaluation or come back to the ED should symptoms continue or worsen.  Total care time: 40 minutes.

## 2023-03-20 NOTE — Telephone Encounter (Signed)
 Pt called in stating she was in the ED with high BP however they did not give her any BP medication. She asked if [place], [place] can prescribe something for her or adjust any meds she is already on.  Please advise.       Vitals:    03/20/23 0428 03/20/23 0445  BP:   (!) 166/85  Pulse:   93  Resp:   13  Temp: 98.3 F (36.8 C)    SpO2:   96%

## 2023-03-20 NOTE — Telephone Encounter (Signed)
 Spoke to pt who verbalized understanding. Patient will come in for a NV on Thursday 3/6 for BP check and EKG per CF (verbal).

## 2023-03-20 NOTE — ED Notes (Signed)
 Pt ambulatory to restroom

## 2023-03-20 NOTE — ED Notes (Signed)
 Ambulatory to restroom

## 2023-03-20 NOTE — Telephone Encounter (Signed)
 Called patient to ask for more readings.   189/76 181/73  Pt is requesting to start bp medication. Pt stated that she went to the hospital, but was only given 1000 mg of tylenol. Please advise.
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
## 2023-03-20 NOTE — ED Notes (Signed)
 Pt spoke with ED physician, admitting physician, and myself about decision to leave AMA. Pt ensured to be Aox4 and of sound mind to make her own decisions. She was given all pertinent information and then recommended to return to ED for any changes in her condition, as well as changes in her decision. Pt signed AMA form, IV removed, and patient taken to her vehicle in a wheelchair.

## 2023-03-20 NOTE — ED Triage Notes (Addendum)
 Pt c/o high blood pressure that started tonight. EMS came to pt house and it was over 200. Pt endorses headache and dry mouth. Pt has anxiety and appears to be anxious. Pt has taken her anxiety medication. No hx of HBP

## 2023-03-21 LAB — T3, FREE: T3, Free: 2.8 pg/mL (ref 2.0–4.4)

## 2023-03-25 DIAGNOSIS — I351 Nonrheumatic aortic (valve) insufficiency: Secondary | ICD-10-CM | POA: Diagnosis not present

## 2023-03-25 DIAGNOSIS — E271 Primary adrenocortical insufficiency: Secondary | ICD-10-CM | POA: Diagnosis not present

## 2023-03-25 DIAGNOSIS — M1991 Primary osteoarthritis, unspecified site: Secondary | ICD-10-CM | POA: Diagnosis not present

## 2023-03-25 DIAGNOSIS — G894 Chronic pain syndrome: Secondary | ICD-10-CM | POA: Diagnosis not present

## 2023-03-25 DIAGNOSIS — I10 Essential (primary) hypertension: Secondary | ICD-10-CM | POA: Diagnosis not present

## 2023-03-26 ENCOUNTER — Ambulatory Visit: Payer: 59

## 2023-03-26 ENCOUNTER — Other Ambulatory Visit (HOSPITAL_COMMUNITY): Payer: Self-pay | Admitting: Internal Medicine

## 2023-03-26 ENCOUNTER — Ambulatory Visit
Admission: RE | Admit: 2023-03-26 | Discharge: 2023-03-26 | Disposition: A | Discharge status: Home / Self Care | Source: Ambulatory Visit | Attending: Internal Medicine | Admitting: Internal Medicine

## 2023-03-26 VITALS — BP 118/70 | HR 67 | Ht 66.0 in | Wt 207.0 lb

## 2023-03-26 DIAGNOSIS — I480 Paroxysmal atrial fibrillation: Secondary | ICD-10-CM

## 2023-03-26 DIAGNOSIS — M4316 Spondylolisthesis, lumbar region: Secondary | ICD-10-CM | POA: Diagnosis not present

## 2023-03-26 DIAGNOSIS — M419 Scoliosis, unspecified: Secondary | ICD-10-CM | POA: Diagnosis not present

## 2023-03-26 DIAGNOSIS — M545 Low back pain, unspecified: Secondary | ICD-10-CM | POA: Insufficient documentation

## 2023-03-26 DIAGNOSIS — M5136 Other intervertebral disc degeneration, lumbar region with discogenic back pain only: Secondary | ICD-10-CM | POA: Diagnosis not present

## 2023-03-26 DIAGNOSIS — I7090 Unspecified atherosclerosis: Secondary | ICD-10-CM | POA: Diagnosis not present

## 2023-03-26 NOTE — Patient Instructions (Signed)
 Medication Instructions:  Your physician recommends that you continue on your current medications as directed. Please refer to the Current Medication list given to you today.  *If you need a refill on your cardiac medications before your next appointment, please call your pharmacy*   Lab Work: None If you have labs (blood work) drawn today and your tests are completely normal, you will receive your results only by: MyChart Message (if you have MyChart) OR A paper copy in the mail If you have any lab test that is abnormal or we need to change your treatment, we will call you to review the results.   Testing/Procedures: None   Follow-Up: At [facility], you and your health needs are our priority.  As part of our continuing mission to provide you with exceptional heart care, we have created designated Provider Care Teams.  These Care Teams include your primary Cardiologist (physician) and Advanced Practice Providers (APPs -  Physician Assistants and Nurse Practitioners) who all work together to provide you with the care you need, when you need it.  We recommend signing up for the patient portal called "MyChart".  Sign up information is provided on this After Visit Summary.  MyChart is used to connect with patients for Virtual Visits (Telemedicine).  Patients are able to view lab/test results, encounter notes, upcoming appointments, etc.  Non-urgent messages can be sent to your provider as well.   To learn more about what you can do with MyChart, go to [email or web address].

## 2023-03-26 NOTE — Progress Notes (Signed)
 Patient presented today for nurse visit- bp check/EKG. Pt presented to ER for elevated bp and left AMA.   Pt stated that she had a PCP visit on 3/5 and bp was "137/she doesn't remember the bottom number".   Pt denies sob, cp, dizziness, or nausea.   Bp check in office: 118/70 HR by EKG is 67

## 2023-04-06 ENCOUNTER — Other Ambulatory Visit (HOSPITAL_COMMUNITY): Payer: Self-pay | Admitting: Internal Medicine

## 2023-04-06 DIAGNOSIS — Z1231 Encounter for screening mammogram for malignant neoplasm of breast: Secondary | ICD-10-CM

## 2023-04-06 DIAGNOSIS — E0590 Thyrotoxicosis, unspecified without thyrotoxic crisis or storm: Secondary | ICD-10-CM | POA: Diagnosis not present

## 2023-04-07 LAB — TSH: TSH: 18.8 u[IU]/mL — ABNORMAL HIGH (ref 0.450–4.500)

## 2023-04-07 LAB — T4, FREE: Free T4: 0.16 ng/dL — ABNORMAL LOW (ref 0.82–1.77)

## 2023-04-07 LAB — THYROGLOBULIN ANTIBODY: Thyroglobulin Antibody: 52.5 [IU]/mL — ABNORMAL HIGH (ref 0.0–0.9)

## 2023-04-07 LAB — T3, FREE: T3, Free: 2.1 pg/mL (ref 2.0–4.4)

## 2023-04-07 LAB — THYROID PEROXIDASE ANTIBODY: Thyroperoxidase Ab SerPl-aCnc: 25 [IU]/mL (ref 0–34)

## 2023-04-15 ENCOUNTER — Encounter: Payer: Self-pay | Admitting: Student

## 2023-04-15 ENCOUNTER — Ambulatory Visit (HOSPITAL_COMMUNITY)

## 2023-04-15 ENCOUNTER — Ambulatory Visit: Payer: 59 | Attending: Student | Admitting: Student

## 2023-04-15 NOTE — Progress Notes (Deleted)
   Cardiology Office Note    Date:  04/15/2023  ID:  Debra Graves, DOB 09-17-1955, MRN [id number] Cardiologist: Nona Dell, MD    History of Present Illness:    Debra Graves is a 68 y.o. female with past medical history of CAD (moderate RCA disease by prior cardiac catheterization in 2006), paroxysmal atrial fibrillation, carotid artery plaque, aortic valve insufficiency and HLD who presents to the office today for 74-month follow-up.  She was examined by myself in 01/2023 and reported intermittent diaphoresis and jitteriness along with a 20 pound unintentional weight loss over the past few months.  Was in atrial fibrillation with heart rate in the 100 bpm.  TSH had recent been checked by her PCP and was significantly low at less than 0.005.  She was referred to endocrinology for further management of hyperthyroidism as attempts to restore normal sinus rhythm would be low-yield.  She had been on Lopressor 75 mg once daily and this was adjusted to Toprol-XL 100 mg daily for sustained release.  She was evaluated by endocrinology in the interim and started on methimazole 10 mg twice daily.  Was evaluated in the ED last month for elevated BP with SBP above 200.  Was under increased stress at that time and BP improved while in the ED.  Initial and repeat Hs troponin values were mildly elevated at 21 and 30 and BNP was at 399.  TSH was still less than 0.010 and T3 and T4 were normal.  Admission was recommended but she declined and left AMA.  She did present to the office for nurse visit on 03/26/2023 and BP was well-controlled at 118/70 and she was in normal sinus rhythm.  No changes were made to her medications.  ROS: ***  Studies Reviewed:   EKG: EKG is*** ordered today and demonstrates ***   EKG Interpretation Date/Time:    Ventricular Rate:    PR Interval:    QRS Duration:    QT Interval:    QTC Calculation:   R Axis:      Text Interpretation:           Risk  Assessment/Calculations:   {Does this patient have ATRIAL FIBRILLATION?:[phone]} No BP recorded.  {Refresh Note OR Click here to enter BP  :1}***         Physical Exam:   VS:  There were no vitals taken for this visit.   Wt Readings from Last 3 Encounters:  03/26/23 207 lb (93.9 kg)  03/20/23 214 lb 15.2 oz (97.5 kg)  03/05/23 215 lb (97.5 kg)     GEN: Well nourished, well developed in no acute distress NECK: No JVD; No carotid bruits CARDIAC: ***RRR, no murmurs, rubs, gallops RESPIRATORY:  Clear to auscultation without rales, wheezing or rhonchi  ABDOMEN: Appears non-distended. No obvious abdominal masses. EXTREMITIES: No clubbing or cyanosis. No edema.  Distal pedal pulses are 2+ bilaterally.   Assessment and Plan:   1. Paroxysmal Atrial Fibrillation - ***  2. CAD/HLD - ***  3. Aortic Regurgitation - Mild by echo in 05/2022.  4. Hyperthyroidism - ***  5. Carotid Plaque - Most recent dopplers in 08/2021 showed minimal plaque with no significant stenosis.  Remains on statin therapy.  Signed, Ellsworth Lennox, PA-C

## 2023-04-17 ENCOUNTER — Encounter (HOSPITAL_COMMUNITY): Payer: Self-pay

## 2023-04-17 ENCOUNTER — Ambulatory Visit (HOSPITAL_COMMUNITY)
Admission: RE | Admit: 2023-04-17 | Discharge: 2023-04-17 | Disposition: A | Discharge status: Home / Self Care | Source: Ambulatory Visit | Attending: Internal Medicine | Admitting: Internal Medicine

## 2023-04-17 DIAGNOSIS — Z1231 Encounter for screening mammogram for malignant neoplasm of breast: Secondary | ICD-10-CM | POA: Insufficient documentation

## 2023-04-20 ENCOUNTER — Ambulatory Visit (INDEPENDENT_AMBULATORY_CARE_PROVIDER_SITE_OTHER): Payer: 59 | Admitting: Nurse Practitioner

## 2023-04-20 ENCOUNTER — Encounter: Payer: Self-pay | Admitting: Nurse Practitioner

## 2023-04-20 VITALS — BP 116/80 | HR 66 | Ht 66.0 in | Wt 210.6 lb

## 2023-04-20 DIAGNOSIS — E0590 Thyrotoxicosis, unspecified without thyrotoxic crisis or storm: Secondary | ICD-10-CM

## 2023-04-20 MED ORDER — METHIMAZOLE 5 MG PO TABS: 5.0000 mg | ORAL_TABLET | Freq: Two times a day (BID) | ORAL | 1 refills | Status: DC

## 2023-04-20 NOTE — Progress Notes (Signed)
 04/20/2023     Endocrinology Follow Up Note    Subjective:    Patient ID: Debra Graves, female    DOB: 17-Jan-1956, PCP Debra Nevins, MD.   Past Medical History:  Diagnosis Date   Adrenal insufficiency [facility])    Anxiety    Aortic insufficiency    Arthritis    Back pain    CAD (coronary artery disease)    Moderate ostial RCA disease at cardiac catheterization 2006   Constipation    Depression    Fibroids    Hypercholesteremia    Mild carotid artery disease (HCC)    Rectocele     Past Surgical History:  Procedure Laterality Date   CARDIAC CATHETERIZATION  03/08/2004   mild progressive CAD   carotid duplex  03/09/2007   rt subclavian 0 to 49%,right bulb 0 to 49 %,lf ICA normal   COLONOSCOPY N/A 06/16/2013   Dr. Jena Graves: hyperplastic polyps, melanosis coli    DOPPLER ECHOCARDIOGRAPHY  03/20/2010   EF >55 %,trace mitral regurg,mild tricuspid regurg,aortic valve mildly sclerotic'mild to moderate aortic regurg,trace pulmonic valver regurg   NM MYOCAR PERF WALL MOTION  03/19/06   EF 61%,low risk study    Social History   Socioeconomic History   Marital status: Divorced    Spouse name: Not on file   Number of children: Not on file   Years of education: Not on file   Highest education level: Not on file  Occupational History   Occupation: disability    Employer: UNEMPLOYED  Tobacco Use   Smoking status: Former    Current packs/day: 0.50    Average packs/day: 0.5 packs/day for 41.0 years (20.5 ttl pk-yrs)    Types: Cigarettes   Smokeless tobacco: Never  Vaping Use   Vaping status: Never Used  Substance and Sexual Activity   Alcohol use: No   Drug use: No   Sexual activity: Not Currently    Birth control/protection: None, Post-menopausal    Comment: once in 9 months  Other Topics Concern   Not on file  Social History Narrative   Drinks 3 cups of coffee daily.   Social Drivers of Corporate investment banker Strain: Not on file  Food Insecurity: Not  on file  Transportation Needs: Not on file  Physical Activity: Not on file  Stress: Not on file  Social Connections: Not on file    Family History  Problem Relation Age of Onset   Breast cancer Mother    Cancer Mother 55       Breast then bones and all over   Coronary artery disease Father    Heart disease Father    Stroke Father    Hypertension Sister    Breast cancer Sister 54   Heart disease Sister    Stroke Brother    Coronary artery disease Brother    Coronary artery disease Brother    Coronary artery disease Brother    Colon cancer Neg Hx     Outpatient Encounter Medications as of 04/20/2023  Medication Sig   clonazePAM (KLONOPIN) 0.5 MG tablet Take 0.5 mg by mouth daily.   Docusate Calcium (STOOL SOFTENER PO) Take 2 tablets by mouth daily.   DULoxetine (CYMBALTA) 60 MG capsule Take 60 mg by mouth daily.   ELIQUIS 5 MG TABS tablet Take 1 tablet (5 mg total) by mouth 2 (two) times daily.   esomeprazole (NEXIUM) 20 MG capsule Take 1 capsule (20 mg total) by mouth 2 (two)  times daily before a meal. Office visit needed for additional refills.   fish oil-omega-3 fatty acids 1000 MG capsule Take 2 g by mouth daily.   hydrocortisone (CORTEF) 10 MG tablet Take 10 mg by mouth 2 (two) times daily.   LORazepam (ATIVAN) 1 MG tablet Take 1 mg by mouth every 6 (six) hours as needed for anxiety.   MAGNESIUM PO Take by mouth daily.   meloxicam (MOBIC) 7.5 MG tablet Take 7.5 mg by mouth 2 (two) times daily.   methimazole (TAPAZOLE) 5 MG tablet Take 1 tablet (5 mg total) by mouth 2 (two) times daily.   metoprolol succinate (TOPROL-XL) 100 MG 24 hr tablet Take 1 tablet (100 mg total) by mouth daily. Take with or immediately following a meal.   potassium chloride SA (KLOR-CON M) 20 MEQ tablet Take 20 mEq by mouth daily.   rosuvastatin (CRESTOR) 40 MG tablet TAKE 1 TABLET BY MOUTH ONCE DAILY.   Vitamin D, Ergocalciferol, 2000 units CAPS Take 1 capsule by mouth daily.   [DISCONTINUED]  methimazole (TAPAZOLE) 10 MG tablet Take 1 tablet (10 mg total) by mouth 2 (two) times daily.   furosemide (LASIX) 20 MG tablet Take 20 mg by mouth 2 (two) times daily. (Patient not taking: Reported on 04/20/2023)   No facility-administered encounter medications on file as of 04/20/2023.    ALLERGIES: Allergies  Allergen Reactions   Albuterol    Bactrim [Sulfamethoxazole-Trimethoprim]    Vytorin [Ezetimibe-Simvastatin]     Myalgias     VACCINATION STATUS:  There is no immunization history on file for this patient.   HPI  Debra Graves is 68 y.o. female who presents today with a medical history as above. she is being seen in follow up after being seen in consultation for hyperthyroidism requested by her cardiologist Debra An, PA as she is being seen for persistent Afib.  she has been dealing with symptoms of unexplained weight loss, fatigue, heart palpitations, shortness of breath, tremors, diarrhea, muscle weakness, and anxiety for about 5-6 months. These symptoms are progressively worsening and troubling to her.  her most recent thyroid labs revealed suppressed TSH of < 0.01, elevated FT4 of 3.95 and elevated FT3 of 16.9 on 02/20/23.  she denies dysphagia, choking, shortness of breath, no recent voice change.    she does have family history of thyroid dysfunction in her sister and niece, but denies family hx of thyroid cancer. she denies personal history of goiter. she is not on any anti-thyroid medications nor on any thyroid hormone supplements. Denies use of Biotin containing supplements.    Review of systems  Constitutional: + Minimally fluctuating body weight,  current Body mass index is 33.99 kg/m. , no fatigue, no subjective hyperthermia, no subjective hypothermia Eyes: no blurry vision, no xerophthalmia ENT: no sore throat, no nodules palpated in throat, no dysphagia/odynophagia, no hoarseness Cardiovascular: no chest pain, no shortness of breath, no palpitations,  no leg swelling Respiratory: no cough, no shortness of breath Gastrointestinal: no nausea/vomiting/diarrhea Musculoskeletal: no muscle/joint aches Skin: no rashes, no hyperemia Neurological: no tremors, no numbness, no tingling, no dizziness Psychiatric: no depression, no anxiety   Objective:    BP 116/80 (BP Location: Left Arm, Patient Position: Sitting, Cuff Size: Large)   Pulse 66   Ht 5\' 6"  (1.676 m)   Wt 210 lb 9.6 oz (95.5 kg)   BMI 33.99 kg/m   Wt Readings from Last 3 Encounters:  04/20/23 210 lb 9.6 oz (95.5 kg)  03/26/23 207 lb (93.9  kg)  03/20/23 214 lb 15.2 oz (97.5 kg)     BP Readings from Last 3 Encounters:  04/20/23 116/80  03/26/23 118/70  03/20/23 (!) 159/72                         Physical Exam- Limited  Constitutional:  Body mass index is 33.99 kg/m. , not in acute distress, normal state of mind Eyes:  EOMI, no exophthalmos Musculoskeletal: no gross deformities, strength intact in all four extremities, no gross restriction of joint movements Skin:  no rashes, no hyperemia Neurological: no tremor with outstretched hands   CMP     Component Value Date/Time   NA 137 03/20/2023 0406   NA 143 04/09/2015 0957   K 2.9 (L) 03/20/2023 0406   CL 107 03/20/2023 0406   CO2 22 03/20/2023 0406   GLUCOSE 106 (H) 03/20/2023 0406   BUN 9 03/20/2023 0406   BUN 12 04/09/2015 0957   CREATININE 0.73 03/20/2023 0406   CREATININE 0.93 03/20/2014 0851   CALCIUM 8.6 (L) 03/20/2023 0406   PROT 6.0 (L) 03/20/2023 0406   PROT 6.3 04/09/2015 0957   ALBUMIN 3.0 (L) 03/20/2023 0406   ALBUMIN 4.1 04/09/2015 0957   AST 20 03/20/2023 0406   ALT 15 03/20/2023 0406   ALKPHOS 50 03/20/2023 0406   BILITOT 0.5 03/20/2023 0406   BILITOT 0.4 04/09/2015 0957   GFRNONAA >60 03/20/2023 0406     CBC    Component Value Date/Time   WBC 8.8 03/20/2023 0406   RBC 4.36 03/20/2023 0406   HGB 12.5 03/20/2023 0406   HCT 37.9 03/20/2023 0406   PLT 251 03/20/2023 0406   MCV  86.9 03/20/2023 0406   MCH 28.7 03/20/2023 0406   MCHC 33.0 03/20/2023 0406   RDW 13.2 03/20/2023 0406     Diabetic Labs (most recent): No results found for: "HGBA1C", "MICROALBUR"  Lipid Panel     Component Value Date/Time   CHOL 166 04/09/2015 0957   TRIG 79 04/09/2015 0957   HDL 76 04/09/2015 0957   CHOLHDL 2.6 03/20/2014 0851   VLDL 23 03/20/2014 0851   LDLCALC 74 04/09/2015 0957   LABVLDL 16 04/09/2015 0957     Lab Results  Component Value Date   TSH 18.800 (H) 04/06/2023   TSH <0.010 (L) 03/20/2023   TSH <0.010 (L) 02/20/2023   TSH 0.01 (A) 01/08/2023   TSH 3.39 06/13/2022   TSH 2.96 08/14/2021   FREET4 0.16 (L) 04/06/2023   FREET4 0.61 03/20/2023   FREET4 3.95 (H) 02/20/2023      Latest Reference Range & Units 06/13/22 00:00 01/08/23 00:00 02/20/23 15:24 03/20/23 04:06 03/20/23 06:08 04/06/23 10:13  TSH 0.450 - 4.500 uIU/mL 3.39 (E) 0.01 ! (E) <0.010 (L) <0.010 (L)  18.800 (H)  Triiodothyronine,Free,Serum 2.0 - 4.4 pg/mL   16.9 (H)  2.8 2.1  T4,Free(Direct) 0.82 - 1.77 ng/dL   1.61 (H)  0.96 0.45 (L)  Thyroperoxidase Ab SerPl-aCnc 0 - 34 IU/mL      25  Thyroglobulin Antibody 0.0 - 0.9 IU/mL      52.5 (H)  !: Data is abnormal (L): Data is abnormally low (H): Data is abnormally high (E): External lab result   Assessment & Plan:   1. Hyperthyroidism (Primary)  she is being seen at a kind request of Debra Nevins, MD.  her history and most recent labs are reviewed, and she was examined clinically. Subjective and objective findings are consistent with thyrotoxicosis  likely from primary hyperthyroidism. The potential risks of untreated thyrotoxicosis and the need for definitive therapy have been discussed in detail with her, and she agrees to proceed with diagnostic workup and treatment plan.   Her repeat thyroid function tests show great improvement on Methimazole, so much so, we can reduce the dose to 5 mg po twice daily and repeat labs in 8 weeks.  Plan  will be to taper her off Methimazole and then run the uptake and scan so that we can proceed with definitive treatment.  She is more likely to have recurrence after coming off the Methimazole for prolonged period of time.  Her thyroid antibodies were positive, indicating autoimmune thyroid dysfunction.   She is already on a beta-blocker which may help with some hyperactive thyroid symptoms.    -Patient is advised to maintain close follow up with Debra Nevins, MD for primary care needs.    I spent  25  minutes in the care of the patient today including review of labs from Thyroid Function, CMP, and other relevant labs ; imaging/biopsy records (current and previous including abstractions from other facilities); face-to-face time discussing  her lab results and symptoms, medications doses, her options of short and long term treatment based on the latest standards of care / guidelines;   and documenting the encounter.  Debra Graves  participated in the discussions, expressed understanding, and voiced agreement with the above plans.  All questions were answered to her satisfaction. she is encouraged to contact clinic should she have any questions or concerns prior to her return visit.  Follow up plan: Return in about 2 months (around 06/20/2023) for Thyroid follow up, Previsit labs.   Thank you for involving me in the care of this pleasant patient, and I will continue to update you with her progress.  Ronny Bacon, [facility] [address] [place], [place] [address] Phone: [phone] Fax: [phone]  04/20/2023, 2:42 PM

## 2023-04-20 NOTE — Patient Instructions (Signed)
 Reduce Methimazole to 5 mg twice daily (may take half of the 10 mg pills you have until you deplete your current supply).

## 2023-05-06 ENCOUNTER — Ambulatory Visit: Admitting: Cardiology

## 2023-05-07 ENCOUNTER — Encounter: Payer: Self-pay | Admitting: Gastroenterology

## 2023-06-03 ENCOUNTER — Encounter: Payer: Self-pay | Admitting: *Deleted

## 2023-06-16 DIAGNOSIS — E0590 Thyrotoxicosis, unspecified without thyrotoxic crisis or storm: Secondary | ICD-10-CM | POA: Diagnosis not present

## 2023-06-17 ENCOUNTER — Other Ambulatory Visit: Payer: Self-pay | Admitting: Nurse Practitioner

## 2023-06-17 ENCOUNTER — Ambulatory Visit: Payer: Self-pay | Admitting: Nurse Practitioner

## 2023-06-17 DIAGNOSIS — E0590 Thyrotoxicosis, unspecified without thyrotoxic crisis or storm: Secondary | ICD-10-CM

## 2023-06-17 LAB — T4, FREE: Free T4: 2.75 ng/dL — ABNORMAL HIGH (ref 0.82–1.77)

## 2023-06-17 LAB — TSH: TSH: 0.008 u[IU]/mL — ABNORMAL LOW (ref 0.450–4.500)

## 2023-06-17 LAB — T3, FREE: T3, Free: 13.4 pg/mL — ABNORMAL HIGH (ref 2.0–4.4)

## 2023-06-17 MED ORDER — METHIMAZOLE 5 MG PO TABS: 10.0000 mg | ORAL_TABLET | Freq: Two times a day (BID) | ORAL | 1 refills | Status: DC

## 2023-06-17 NOTE — Progress Notes (Signed)
 I have already sent in the script for her.  Thanks for discussing with her.

## 2023-06-17 NOTE — Progress Notes (Signed)
 I reached out to her about her lab results since they were high again.  We can postpone her follow up appt for 8 weeks with repeat labs.

## 2023-06-17 NOTE — Progress Notes (Signed)
 Your welcome.

## 2023-06-17 NOTE — Telephone Encounter (Signed)
 She called back, she does not use mychart so I went over what you said to her. She said please send in the RX to [place].

## 2023-06-24 ENCOUNTER — Ambulatory Visit: Admitting: Nurse Practitioner

## 2023-06-25 NOTE — Progress Notes (Unsigned)
 Cardiology Office Note    Date:  06/26/2023  ID:  Debra Graves, DOB March 01, 1955, MRN [id number] Cardiologist: Teddie Favre, MD    History of Present Illness:    Debra Graves is a 68 y.o. female with past medical history of CAD (moderate RCA disease by prior cardiac catheterization in 2006), paroxysmal atrial fibrillation, carotid artery plaque, aortic valve insufficiency and HLD who presents to the office today for overdue 50-month follow-up.   She was examined by myself in 01/2023 and reported intermittent diaphoresis and jitteriness along with a 20 pound unintentional weight loss over the past few months. Was in atrial fibrillation with heart rate at 100 bpm. TSH had recently been checked by her PCP and was significantly low at less than 0.005. She was referred to Endocrinology for further management of hyperthyroidism as attempts to restore normal sinus rhythm would be low-yield. She had been on Lopressor  75 mg once daily and this was adjusted to Toprol -XL 100 mg daily for sustained release.   She was evaluated by Endocrinology in the interim and started on Methimazole  10 mg twice daily. Was evaluated in the ED in 02/2023 for elevated BP with SBP above 200. Was under increased stress at that time and BP improved while in the ED. Initial and repeat Hs troponin values were mildly elevated at 21 and 30 and BNP was at 399. TSH was still less than 0.010 and T3 and T4 were normal. Admission was recommended but she declined and left AMA.   She did present to the office for a nurse visit on 03/26/2023 and BP was well-controlled at 118/70 and she was in normal sinus rhythm. No changes were made to her medications.  In talking with the patient today, she reports feeling more "jittery" over the past few weeks.  Has checked her heart rate at home and has been elevated above 100 bpm. Denies any specific palpitations but says that she can see her heart racing. Breathing has been stable with no specific  dyspnea on exertion, orthopnea or PND. No recent chest pain. She does experience intermittent lower extremity edema and takes Lasix 40 mg daily. Does report consuming at least 3 cans of soda daily. No alcohol use. She has not been taking Toprol -XL for unclear reasons but believes she has this at home.  Studies Reviewed:   EKG: EKG is ordered today and demonstrates:   EKG Interpretation Date/Time:  Friday June 26 2023 13:08:38 EDT Ventricular Rate:  123 PR Interval:    QRS Duration:  92 QT Interval:  278 QTC Calculation: 398 R Axis:   -5  Text Interpretation: Atrial fibrillation with rapid ventricular response Minimal voltage criteria for LVH, may be normal variant ( R in aVL ) Confirmed by Woodfin Hays ([id number]) on 06/26/2023 1:09:54 PM       Event Monitor: 08/2021 ZIO XT reviewed.  5 days, 21 hours analyzed.   Predominant rhythm is sinus with heart rate ranging from 56 bpm up to 109 bpm and average heart rate 69 bpm. Paroxysmal atrial fibrillation was documented, overall rhythm burden of only 4% however.  Average heart rate during events was 98 bpm with limited rapid ventricular response. There were rare PACs including atrial couplets and triplets representing less than 1% total beats. There were rare PVCs representing less than 1% total beats. No pauses.  Echocardiogram: 05/2022 IMPRESSIONS     1. Left ventricular ejection fraction, by estimation, is 65 to 70%. Left  ventricular ejection fraction by PLAX is 68 %.  The left ventricle has  normal function. The left ventricle has no regional wall motion  abnormalities. Left ventricular diastolic  parameters are consistent with Grade I diastolic dysfunction (impaired  relaxation).   2. Right ventricular systolic function is low normal. The right  ventricular size is normal.   3. The mitral valve is grossly normal. Trivial mitral valve  regurgitation.   4. The aortic valve is tricuspid. Aortic valve regurgitation is mild.   Aortic valve sclerosis is present, with no evidence of aortic valve  stenosis. Aortic regurgitation PHT measures 504 msec.   5. The inferior vena cava is normal in size with greater than 50%  respiratory variability, suggesting right atrial pressure of 3 mmHg.   Comparison(s): Changes from prior study are noted. 08/27/2021: LVEF 60-65%,  moderate AI.   Risk Assessment/Calculations:    CHA2DS2-VASc Score = 3   This indicates a 3.2% annual risk of stroke. The patient's score is based upon: CHF History: 0 HTN History: 0 Diabetes History: 0 Stroke History: 0 Vascular Disease History: 1 Age Score: 1 Gender Score: 1    Physical Exam:   VS:  BP 138/74   Pulse (!) 123   Ht 5\' 6"  (1.676 m)   Wt 208 lb 3.2 oz (94.4 kg)   SpO2 92%   BMI 33.60 kg/m    Wt Readings from Last 3 Encounters:  06/26/23 208 lb 3.2 oz (94.4 kg)  04/20/23 210 lb 9.6 oz (95.5 kg)  03/26/23 207 lb (93.9 kg)     GEN: Well nourished, well developed female appearing in no acute distress NECK: No JVD; No carotid bruits CARDIAC: Irregularly irregular, no murmurs, rubs, gallops RESPIRATORY:  Clear to auscultation without rales, wheezing or rhonchi  ABDOMEN: Appears non-distended. No obvious abdominal masses. EXTREMITIES: No clubbing or cyanosis.  Trace ankle edema bilaterally.  Distal pedal pulses are 2+ bilaterally.   Assessment and Plan:   1. Paroxysmal Atrial Fibrillation - Was previously found to have more persistent atrial fibrillation but this was in the setting of hyperthyroidism. She did convert back to normal sinus rhythm in the interim and was in normal sinus rhythm by EKG on 03/26/2023. Back in atrial fibrillation today which is not surprising given her recurrent hyperthyroidism by repeat labs. HR was initially in the 130's but improved into the 110's during the encounter. Would not arrange for DCCV at this time given her paroxysmal episodes and hyperthyroidism. She has been without Toprol -XL 100 mg  daily and will restart this. Will arrange for a nurse visit in 1 to 2 weeks for repeat EKG as this may need to be further titrated. - Continue Eliquis  5mg  BID for anticoagulation which is the appropriate dose given her age, weight and renal function (creatinine 0.73 in 02/2023).   2. CAD/HLD - She had moderate RCA disease by prior cardiac catheterization in 2006. Denies any specific anginal symptoms. - LDL was at 84 in 05/2022. Will recheck FLP and LFT's with upcoming labs. Remains on Crestor  40 mg daily. If LDL remains above goal, would add Zetia 10 mg daily. Not on ASA given the need for anticoagulation.   3. Aortic Regurgitation - Mild by echo in 05/2022. Will plan for follow-up imaging in 2 to 3 years for reassessment.   4. Hyperthyroidism - TSH was at 0.008 when checked in 05/2023 with Free T4 elevated at 2.75 and Free T3 at 13.4. She has been followed by Endocrinology and Methimazole  was recently titrated to 10 mg twice daily. She is due  for follow-up labs next month.   5. Carotid Plaque - Most recent dopplers in 08/2021 showed minimal plaque with no significant stenosis.  Remains on statin therapy.  Disposition: Will arrange for a nurse visit in 1-2 weeks for a repeat EKG as we may need to further titrate Toprol -XL. Follow-up with MD or APP in 2-3 months.    Signed, Dorma Gash, PA-C

## 2023-06-26 ENCOUNTER — Encounter: Payer: Self-pay | Admitting: Student

## 2023-06-26 ENCOUNTER — Ambulatory Visit: Attending: Student | Admitting: Student

## 2023-06-26 VITALS — BP 138/74 | HR 123 | Ht 66.0 in | Wt 208.2 lb

## 2023-06-26 DIAGNOSIS — I6523 Occlusion and stenosis of bilateral carotid arteries: Secondary | ICD-10-CM | POA: Diagnosis not present

## 2023-06-26 DIAGNOSIS — I480 Paroxysmal atrial fibrillation: Secondary | ICD-10-CM | POA: Diagnosis not present

## 2023-06-26 DIAGNOSIS — E0590 Thyrotoxicosis, unspecified without thyrotoxic crisis or storm: Secondary | ICD-10-CM | POA: Diagnosis not present

## 2023-06-26 DIAGNOSIS — E785 Hyperlipidemia, unspecified: Secondary | ICD-10-CM | POA: Diagnosis not present

## 2023-06-26 DIAGNOSIS — I2510 Atherosclerotic heart disease of native coronary artery without angina pectoris: Secondary | ICD-10-CM | POA: Diagnosis not present

## 2023-06-26 DIAGNOSIS — I351 Nonrheumatic aortic (valve) insufficiency: Secondary | ICD-10-CM | POA: Diagnosis not present

## 2023-06-26 MED ORDER — METOPROLOL SUCCINATE ER 100 MG PO TB24: 100.0000 mg | ORAL_TABLET | Freq: Every day | ORAL | 3 refills | Status: DC

## 2023-06-26 NOTE — Patient Instructions (Signed)
 Medication Instructions:   Restart Toprol  XL 100 mg Daily   *If you need a refill on your cardiac medications before your next appointment, please call your pharmacy*  Lab Work: Your physician recommends that you return for lab work in: Fasting ( CMET, FLP)   If you have labs (blood work) drawn today and your tests are completely normal, you will receive your results only by: MyChart Message (if you have MyChart) OR A paper copy in the mail If you have any lab test that is abnormal or we need to change your treatment, we will call you to review the results.  Testing/Procedures: NONE   Follow-Up: At [facility], you and your health needs are our priority.  As part of our continuing mission to provide you with exceptional heart care, our providers are all part of one team.  This team includes your primary Cardiologist (physician) and Advanced Practice Providers or APPs (Physician Assistants and Nurse Practitioners) who all work together to provide you with the care you need, when you need it.  Your next appointment:   2 -3  month(s)  Provider:   You may see Teddie Favre, MD or one of the following Advanced Practice Providers on your designated Care Team:   Woodfin Hays, PA-C  [place], [place] Theotis Flake, [place]     We recommend signing up for the patient portal called "MyChart".  Sign up information is provided on this After Visit Summary.  MyChart is used to connect with patients for Virtual Visits (Telemedicine).  Patients are able to view lab/test results, encounter notes, upcoming appointments, etc.  Non-urgent messages can be sent to your provider as well.   To learn more about what you can do with MyChart, go to [email or web address].   Other Instructions Thank you for choosing [facility]!

## 2023-07-10 ENCOUNTER — Ambulatory Visit: Attending: Internal Medicine

## 2023-07-10 VITALS — BP 144/66 | HR 67 | Ht 66.0 in | Wt 208.0 lb

## 2023-07-10 DIAGNOSIS — I480 Paroxysmal atrial fibrillation: Secondary | ICD-10-CM

## 2023-07-10 NOTE — Progress Notes (Signed)
    Talked with the patient and she reports significant improvement in her symptoms. Back in NSR by repeat EKG. Follow BP in the ambulatory setting as this was previously well-controlled. Continue current medical therapy.   Signed, Dorma Gash, PA-C 07/10/2023, 3:51 PM

## 2023-07-10 NOTE — Patient Instructions (Signed)
 Medication Instructions:  Your physician recommends that you continue on your current medications as directed. Please refer to the Current Medication list given to you today.  *If you need a refill on your cardiac medications before your next appointment, please call your pharmacy*  Lab Work: None If you have labs (blood work) drawn today and your tests are completely normal, you will receive your results only by: MyChart Message (if you have MyChart) OR A paper copy in the mail If you have any lab test that is abnormal or we need to change your treatment, we will call you to review the results.  Testing/Procedures: None  Follow-Up: At [facility], you and your health needs are our priority.  As part of our continuing mission to provide you with exceptional heart care, our providers are all part of one team.  This team includes your primary Cardiologist (physician) and Advanced Practice Providers or APPs (Physician Assistants and Nurse Practitioners) who all work together to provide you with the care you need, when you need it.  Your next appointment:    Keep scheduled follow up   Provider:   You may see Teddie Favre, MD or one of the following Advanced Practice Providers on your designated Care Team:   Woodfin Hays, PA-C  [place], [place] Theotis Flake, [place]     We recommend signing up for the patient portal called MyChart.  Sign up information is provided on this After Visit Summary.  MyChart is used to connect with patients for Virtual Visits (Telemedicine).  Patients are able to view lab/test results, encounter notes, upcoming appointments, etc.  Non-urgent messages can be sent to your provider as well.   To learn more about what you can do with MyChart, go to [email or web address].   Other Instructions

## 2023-07-10 NOTE — Progress Notes (Signed)
 Patient presents today for nurse visit- EKG DX: PAF  Per providers last note:  1. Paroxysmal Atrial Fibrillation - Was previously found to have more persistent atrial fibrillation but this was in the setting of hyperthyroidism. She did convert back to normal sinus rhythm in the interim and was in normal sinus rhythm by EKG on 03/26/2023. Back in atrial fibrillation today which is not surprising given her recurrent hyperthyroidism by repeat labs. HR was initially in the 130's but improved into the 110's during the encounter. Would not arrange for DCCV at this time given her paroxysmal episodes and hyperthyroidism. She has been without Toprol -XL 100 mg daily and will restart this. Will arrange for a nurse visit in 1 to 2 weeks for repeat EKG as this may need to be further titrated. - Continue Eliquis  5mg  BID for anticoagulation which is the appropriate dose given her age, weight and renal function (creatinine 0.73 in 02/2023).   Patient stated she has been compliant with medications since last ov. Pt stated that she feels better than she did at last ov.   EKG completed today and routed to both DOD and ordering provider for review.

## 2023-07-21 NOTE — Telephone Encounter (Signed)
 Patient was called. A message was left with Whitney's recommendations.

## 2023-07-21 NOTE — Telephone Encounter (Signed)
 Patient currently has appointment for July 23. Patient will need lab work, it seems that she was to have called back letting us  know what pharmacy she needs for the Methimazole  to be called into . Whitney has increased it to 10 mg daily. We may need to move  her appointment out for 8 weeks.

## 2023-07-21 NOTE — Progress Notes (Signed)
 Should be Methimazole  10 mg twice daily.  And push out appt for 8 weeks after she has started the higher dose of Methimazole .

## 2023-07-22 NOTE — Progress Notes (Signed)
 Noted

## 2023-07-22 NOTE — Progress Notes (Signed)
 She is doing better on this medication

## 2023-08-12 ENCOUNTER — Ambulatory Visit: Admitting: Nurse Practitioner

## 2023-08-21 DIAGNOSIS — M1991 Primary osteoarthritis, unspecified site: Secondary | ICD-10-CM | POA: Diagnosis not present

## 2023-08-21 DIAGNOSIS — I351 Nonrheumatic aortic (valve) insufficiency: Secondary | ICD-10-CM | POA: Diagnosis not present

## 2023-08-21 DIAGNOSIS — I4891 Unspecified atrial fibrillation: Secondary | ICD-10-CM | POA: Diagnosis not present

## 2023-08-21 DIAGNOSIS — M545 Low back pain, unspecified: Secondary | ICD-10-CM | POA: Diagnosis not present

## 2023-08-21 DIAGNOSIS — Z0001 Encounter for general adult medical examination with abnormal findings: Secondary | ICD-10-CM | POA: Diagnosis not present

## 2023-08-21 DIAGNOSIS — I10 Essential (primary) hypertension: Secondary | ICD-10-CM | POA: Diagnosis not present

## 2023-08-21 DIAGNOSIS — E271 Primary adrenocortical insufficiency: Secondary | ICD-10-CM | POA: Diagnosis not present

## 2023-08-21 DIAGNOSIS — Z9229 Personal history of other drug therapy: Secondary | ICD-10-CM | POA: Diagnosis not present

## 2023-08-22 LAB — LAB REPORT - SCANNED
EGFR: 40
TSH: 128 — AB (ref 0.41–5.90)

## 2023-08-25 ENCOUNTER — Telehealth: Payer: Self-pay | Admitting: *Deleted

## 2023-08-25 NOTE — Telephone Encounter (Signed)
 Have her lower Methimazole  to 5 mg po twice daily.  She has responded to the medication and is now time to back off.  Repeat labs in 8 weeks: TSH, Free T4, Free T3.

## 2023-08-25 NOTE — Telephone Encounter (Signed)
 Patient left a message that she had lab work for Dr. Bertell.  She was called and made aware that her TSH was 128. She is would like to know what to do. In her office note it look like she was to be taking two 5 mg tablets to equal 10 mg twice daily of Methimazole , When I talked with the patient she said that she was taking two 10 mg of Methimazole  twice a day to equal a total of 40 mg. Patient says that she is feeling bad all over. She was advised that this would be reviewed with Whitney and we would call her back.

## 2023-08-25 NOTE — Telephone Encounter (Signed)
 Patient was called and made aware.

## 2023-08-27 ENCOUNTER — Encounter: Payer: Self-pay | Admitting: Internal Medicine

## 2023-09-22 ENCOUNTER — Other Ambulatory Visit: Payer: Self-pay | Admitting: Gastroenterology

## 2023-09-28 ENCOUNTER — Ambulatory Visit: Attending: Cardiology | Admitting: Cardiology

## 2023-09-28 ENCOUNTER — Encounter: Payer: Self-pay | Admitting: Cardiology

## 2023-09-28 VITALS — BP 118/66 | HR 68 | Ht 66.0 in | Wt 219.2 lb

## 2023-09-28 DIAGNOSIS — I2510 Atherosclerotic heart disease of native coronary artery without angina pectoris: Secondary | ICD-10-CM

## 2023-09-28 DIAGNOSIS — I480 Paroxysmal atrial fibrillation: Secondary | ICD-10-CM

## 2023-09-28 DIAGNOSIS — I351 Nonrheumatic aortic (valve) insufficiency: Secondary | ICD-10-CM | POA: Diagnosis not present

## 2023-09-28 DIAGNOSIS — E782 Mixed hyperlipidemia: Secondary | ICD-10-CM | POA: Diagnosis not present

## 2023-09-28 MED ORDER — EZETIMIBE 10 MG PO TABS: 10.0000 mg | ORAL_TABLET | Freq: Every day | ORAL | 3 refills | Status: AC

## 2023-09-28 MED ORDER — METOPROLOL SUCCINATE ER 100 MG PO TB24: 100.0000 mg | ORAL_TABLET | Freq: Every day | ORAL | 3 refills | Status: AC

## 2023-09-28 MED ORDER — FUROSEMIDE 20 MG PO TABS: 20.0000 mg | ORAL_TABLET | Freq: Two times a day (BID) | ORAL | 3 refills | Status: DC

## 2023-09-28 NOTE — Progress Notes (Signed)
    Cardiology Office Note  Date: 09/28/2023   ID: Debra Graves, DOB 1955-11-27, MRN [id number]  History of Present Illness: Debra Graves is a 68 y.o. female last seen in June by Ms. Strader PA-C, I reviewed her note.  Our last visit was in 2023.  She is here for a follow-up visit.  Reports no interval atrial fibrillation since follow-up ECG in June.  She continues management of thyroid  disease per endocrinology, treated for hyperthyroidism with methimazole , and now with evidence of hypothyroidism by her last TSH in August.  Medications reviewed.  She reports no spontaneous bleeding problems on Eliquis .  States that she has been compliant with Crestor  40 mg daily, her most recent LDL increased to 119.  Physical Exam: VS:  BP 118/66 (BP Location: Left Arm, Cuff Size: Large)   Pulse 68   Ht 5' 6 (1.676 m)   Wt 219 lb 3.2 oz (99.4 kg)   SpO2 97%   BMI 35.38 kg/m , BMI Body mass index is 35.38 kg/m.  Wt Readings from Last 3 Encounters:  09/28/23 219 lb 3.2 oz (99.4 kg)  07/10/23 208 lb (94.3 kg)  06/26/23 208 lb 3.2 oz (94.4 kg)    General: Patient appears comfortable at rest. HEENT: Conjunctiva and lids normal. Neck: Supple, no elevated JVP or carotid bruits. Lungs: Clear to auscultation, nonlabored breathing at rest. Cardiac: Regular rate and rhythm, no S3 or significant systolic murmur.  ECG:  An ECG dated 07/10/2023 was personally reviewed today and demonstrated:  Sinus rhythm.  Labwork: 03/20/2023: ALT 15; AST 20; B Natriuretic Peptide 399.0; BUN 9; Creatinine, Ser 0.73; Hemoglobin 12.5; Magnesium  1.6; Platelets 251; Potassium 2.9; Sodium 137 08/22/2023: TSH 128.00  August 2025: Cholesterol 238, triglycerides 104, HDL 101, LDL 119  Other Studies Reviewed Today:  No interval cardiac testing for review today.  Assessment and Plan:  1.  Paroxysmal atrial fibrillation with CHA2DS2-VASc score of 3.  Heart rate regular today, and sinus rhythm by last ECG in June after  breakthrough episode in the setting of thyroid  disease.  For now continue Toprol -XL 100 mg daily and Eliquis  5 mg twice daily.  2.  Moderate nonobstructive RCA disease by cardiac catheterization in 2006.  She does not describe any obvious angina.  Continue Crestor  40 mg daily, also adding Zetia  10 mg daily with LDL up to 119 in August.  3.  Aortic regurgitation.  Echocardiogram in May 2024 revealed mild aortic regurgitation in the setting of aortic valve sclerosis without stenosis.  Disposition:  Follow up 6 months.  Signed, Debra Graves, M.D., F.A.C.C. [facility]

## 2023-09-28 NOTE — Patient Instructions (Signed)
Medication Instructions:  START Zetia 10 mg daily   Labwork: None today  Testing/Procedures: None today  Follow-Up: 6 months  Any Other Special Instructions Will Be Listed Below (If Applicable).  If you need a refill on your cardiac medications before your next appointment, please call your pharmacy.  

## 2023-10-07 ENCOUNTER — Ambulatory Visit: Admitting: Nurse Practitioner

## 2023-10-15 ENCOUNTER — Other Ambulatory Visit (HOSPITAL_COMMUNITY)
Admission: RE | Admit: 2023-10-15 | Discharge: 2023-10-15 | Disposition: A | Discharge status: Home / Self Care | Source: Ambulatory Visit | Attending: Student | Admitting: Student

## 2023-10-15 ENCOUNTER — Ambulatory Visit: Payer: Self-pay | Admitting: Student

## 2023-10-15 ENCOUNTER — Other Ambulatory Visit (HOSPITAL_COMMUNITY)
Admission: RE | Admit: 2023-10-15 | Discharge: 2023-10-15 | Disposition: A | Discharge status: Home / Self Care | Source: Ambulatory Visit | Attending: Nurse Practitioner | Admitting: Nurse Practitioner

## 2023-10-15 DIAGNOSIS — E785 Hyperlipidemia, unspecified: Secondary | ICD-10-CM | POA: Insufficient documentation

## 2023-10-15 DIAGNOSIS — E0590 Thyrotoxicosis, unspecified without thyrotoxic crisis or storm: Secondary | ICD-10-CM | POA: Diagnosis not present

## 2023-10-15 LAB — TSH: TSH: 91.021 u[IU]/mL — ABNORMAL HIGH (ref 0.350–4.500)

## 2023-10-15 LAB — COMPREHENSIVE METABOLIC PANEL WITH GFR
ALT: 12 U/L (ref 0–44)
AST: 15 U/L (ref 15–41)
Albumin: 3.6 g/dL (ref 3.5–5.0)
Alkaline Phosphatase: 67 U/L (ref 38–126)
Anion gap: 13 (ref 5–15)
BUN: 18 mg/dL (ref 8–23)
CO2: 24 mmol/L (ref 22–32)
Calcium: 8.9 mg/dL (ref 8.9–10.3)
Chloride: 103 mmol/L (ref 98–111)
Creatinine, Ser: 1.24 mg/dL — ABNORMAL HIGH (ref 0.44–1.00)
GFR, Estimated: 47 mL/min — ABNORMAL LOW (ref >60)
Glucose, Bld: 93 mg/dL (ref 70–99)
Potassium: 4.2 mmol/L (ref 3.5–5.1)
Sodium: 140 mmol/L (ref 135–145)
Total Bilirubin: 0.6 mg/dL (ref 0.0–1.2)
Total Protein: 6.7 g/dL (ref 6.5–8.1)

## 2023-10-15 LAB — LIPID PANEL
Cholesterol: 154 mg/dL (ref 0–200)
HDL: 71 mg/dL (ref >40)
LDL Cholesterol: 64 mg/dL (ref 0–99)
Total CHOL/HDL Ratio: 2.2 ratio
Triglycerides: 93 mg/dL (ref <150)
VLDL: 19 mg/dL (ref 0–40)

## 2023-10-15 LAB — T4, FREE: Free T4: <0.25 ng/dL — ABNORMAL LOW (ref 0.61–1.12)

## 2023-10-16 LAB — T3, FREE: T3, Free: 2.5 pg/mL (ref 2.0–4.4)

## 2023-10-21 ENCOUNTER — Ambulatory Visit (INDEPENDENT_AMBULATORY_CARE_PROVIDER_SITE_OTHER): Admitting: Nurse Practitioner

## 2023-10-21 ENCOUNTER — Encounter: Payer: Self-pay | Admitting: Nurse Practitioner

## 2023-10-21 VITALS — BP 118/74 | HR 86 | Ht 66.0 in | Wt 224.4 lb

## 2023-10-21 DIAGNOSIS — E0590 Thyrotoxicosis, unspecified without thyrotoxic crisis or storm: Secondary | ICD-10-CM

## 2023-10-21 MED ORDER — METHIMAZOLE 5 MG PO TABS: 5.0000 mg | ORAL_TABLET | Freq: Every day | ORAL | 1 refills | Status: DC

## 2023-10-21 NOTE — Progress Notes (Signed)
 10/21/2023     Endocrinology Follow Up Note    Subjective:    Patient ID: Debra Graves, female    DOB: 1955-06-29, PCP Debra Satterfield, MD.   Past Medical History:  Diagnosis Date   Adrenal insufficiency    Anxiety    Aortic insufficiency    Arthritis    Back pain    CAD (coronary artery disease)    Moderate ostial RCA disease at cardiac catheterization 2006   Constipation    Depression    Fibroids    Hypercholesteremia    Mild carotid artery disease    Rectocele     Past Surgical History:  Procedure Laterality Date   CARDIAC CATHETERIZATION  03/08/2004   mild progressive CAD   carotid duplex  03/09/2007   rt subclavian 0 to 49%,right bulb 0 to 49 %,lf ICA normal   COLONOSCOPY N/A 06/16/2013   Dr. Shaaron: hyperplastic polyps, melanosis coli    DOPPLER ECHOCARDIOGRAPHY  03/20/2010   EF >55 %,trace mitral regurg,mild tricuspid regurg,aortic valve mildly sclerotic'mild to moderate aortic regurg,trace pulmonic valver regurg   NM MYOCAR PERF WALL MOTION  03/19/06   EF 61%,low risk study    Social History   Socioeconomic History   Marital status: Divorced    Spouse name: Not on file   Number of children: Not on file   Years of education: Not on file   Highest education level: Not on file  Occupational History   Occupation: disability    Employer: UNEMPLOYED  Tobacco Use   Smoking status: Former    Current packs/day: 0.50    Average packs/day: 0.5 packs/day for 41.0 years (20.5 ttl pk-yrs)    Types: Cigarettes   Smokeless tobacco: Never  Vaping Use   Vaping status: Never Used  Substance and Sexual Activity   Alcohol use: No   Drug use: No   Sexual activity: Not Currently    Birth control/protection: None, Post-menopausal    Comment: once in 9 months  Other Topics Concern   Not on file  Social History Narrative   Drinks 3 cups of coffee daily.   Social Drivers of Corporate investment banker Strain: Not on file  Food Insecurity: Not on file   Transportation Needs: Not on file  Physical Activity: Not on file  Stress: Not on file  Social Connections: Not on file    Family History  Problem Relation Age of Onset   Breast cancer Mother    Cancer Mother 59       Breast then bones and all over   Coronary artery disease Father    Heart disease Father    Stroke Father    Hypertension Sister    Breast cancer Sister 26   Heart disease Sister    Stroke Brother    Coronary artery disease Brother    Coronary artery disease Brother    Coronary artery disease Brother    Colon cancer Neg Hx     Outpatient Encounter Medications as of 10/21/2023  Medication Sig   clonazePAM (KLONOPIN) 0.5 MG tablet Take 0.5 mg by mouth daily.   Docusate Calcium  (STOOL SOFTENER PO) Take 2 tablets by mouth daily.   DULoxetine (CYMBALTA) 60 MG capsule Take 60 mg by mouth daily.   ELIQUIS  5 MG TABS tablet Take 1 tablet (5 mg total) by mouth 2 (two) times daily.   esomeprazole  (NEXIUM ) 20 MG capsule TAKE ONE CAPSULE BY MOUTH TWICE DAILY BEFORE a meal  ezetimibe  (ZETIA ) 10 MG tablet Take 1 tablet (10 mg total) by mouth daily.   fish oil-omega-3 fatty acids 1000 MG capsule Take 2 g by mouth daily.   furosemide  (LASIX ) 20 MG tablet Take 1 tablet (20 mg total) by mouth 2 (two) times daily.   gabapentin (NEURONTIN) 300 MG capsule Take 300 mg by mouth 3 (three) times daily.   hydrocortisone (CORTEF) 10 MG tablet Take 10 mg by mouth 2 (two) times daily.   LORazepam  (ATIVAN ) 1 MG tablet Take 1 mg by mouth every 6 (six) hours as needed for anxiety.   MAGNESIUM  PO Take by mouth daily.   meloxicam (MOBIC) 7.5 MG tablet Take 7.5 mg by mouth 2 (two) times daily.   metoprolol  succinate (TOPROL -XL) 100 MG 24 hr tablet Take 1 tablet (100 mg total) by mouth daily. Take with or immediately following a meal.   potassium chloride  SA (KLOR-CON  M) 20 MEQ tablet Take 20 mEq by mouth daily.   rosuvastatin  (CRESTOR ) 40 MG tablet TAKE 1 TABLET BY MOUTH ONCE DAILY.   traMADol  (ULTRAM) 50 MG tablet Take 50 mg by mouth every 6 (six) hours.   [DISCONTINUED] methimazole  (TAPAZOLE ) 5 MG tablet Take 2 tablets (10 mg total) by mouth 2 (two) times daily. (Patient taking differently: Take 5 mg by mouth 2 (two) times daily.)   methimazole  (TAPAZOLE ) 5 MG tablet Take 1 tablet (5 mg total) by mouth daily.   Vitamin D, Ergocalciferol, 2000 units CAPS Take 1 capsule by mouth daily.   No facility-administered encounter medications on file as of 10/21/2023.    ALLERGIES: Allergies  Allergen Reactions   Albuterol    Bactrim [Sulfamethoxazole-Trimethoprim]    Vytorin [Ezetimibe -Simvastatin]     Myalgias     VACCINATION STATUS:  There is no immunization history on file for this patient.   HPI  Debra Graves is 68 y.o. female who presents today with a medical history as above. she is being seen in follow up after being seen in consultation for hyperthyroidism requested by her cardiologist Debra Qua, PA as she is being seen for persistent Afib.  she has been dealing with symptoms of unexplained weight loss, fatigue, heart palpitations, shortness of breath, tremors, diarrhea, muscle weakness, and anxiety for about 5-6 months. These symptoms are progressively worsening and troubling to her.  her most recent thyroid  labs revealed suppressed TSH of < 0.01, elevated FT4 of 3.95 and elevated FT3 of 16.9 on 02/20/23.  she denies dysphagia, choking, shortness of breath, no recent voice change.    she does have family history of thyroid  dysfunction in her sister and niece, but denies family hx of thyroid  cancer. she denies personal history of goiter. she is not on any anti-thyroid  medications nor on any thyroid  hormone supplements. Denies use of Biotin containing supplements.    Review of systems  Constitutional: + increasing body weight,  current Body mass index is 36.22 kg/m. , no fatigue, no subjective hyperthermia, no subjective hypothermia Eyes: no blurry vision, no  xerophthalmia ENT: no sore throat, no nodules palpated in throat, no dysphagia/odynophagia, no hoarseness Cardiovascular: no chest pain, no shortness of breath, no palpitations, no leg swelling Respiratory: no cough, no shortness of breath Gastrointestinal: no nausea/vomiting/diarrhea, + constipation Musculoskeletal: + diffuse muscle/joint aches Skin: no rashes, no hyperemia, + dry skin Neurological: no tremors, no numbness, no tingling, no dizziness Psychiatric: no depression, no anxiety   Objective:    BP 118/74 (BP Location: Left Arm, Patient Position: Sitting, Cuff Size: Large)  Pulse 86   Ht 5' 6 (1.676 m)   Wt 224 lb 6.4 oz (101.8 kg)   BMI 36.22 kg/m   Wt Readings from Last 3 Encounters:  10/21/23 224 lb 6.4 oz (101.8 kg)  09/28/23 219 lb 3.2 oz (99.4 kg)  07/10/23 208 lb (94.3 kg)     BP Readings from Last 3 Encounters:  10/21/23 118/74  09/28/23 118/66  07/10/23 (!) 144/66                        Physical Exam- Limited  Constitutional:  Body mass index is 36.22 kg/m. , not in acute distress, normal state of mind Eyes:  EOMI, no exophthalmos Musculoskeletal: no gross deformities, strength intact in all four extremities, no gross restriction of joint movements Skin:  no rashes, no hyperemia Neurological: no tremor with outstretched hands   CMP     Component Value Date/Time   NA 140 10/15/2023 1258   NA 143 04/09/2015 0957   K 4.2 10/15/2023 1258   CL 103 10/15/2023 1258   CO2 24 10/15/2023 1258   GLUCOSE 93 10/15/2023 1258   BUN 18 10/15/2023 1258   BUN 12 04/09/2015 0957   CREATININE 1.24 (H) 10/15/2023 1258   CREATININE 0.93 03/20/2014 0851   CALCIUM  8.9 10/15/2023 1258   PROT 6.7 10/15/2023 1258   PROT 6.3 04/09/2015 0957   ALBUMIN 3.6 10/15/2023 1258   ALBUMIN 4.1 04/09/2015 0957   AST 15 10/15/2023 1258   ALT 12 10/15/2023 1258   ALKPHOS 67 10/15/2023 1258   BILITOT 0.6 10/15/2023 1258   BILITOT 0.4 04/09/2015 0957   EGFR 40.0 08/22/2023  1015   GFRNONAA 47 (L) 10/15/2023 1258     CBC    Component Value Date/Time   WBC 8.8 03/20/2023 0406   RBC 4.36 03/20/2023 0406   HGB 12.5 03/20/2023 0406   HCT 37.9 03/20/2023 0406   PLT 251 03/20/2023 0406   MCV 86.9 03/20/2023 0406   MCH 28.7 03/20/2023 0406   MCHC 33.0 03/20/2023 0406   RDW 13.2 03/20/2023 0406     Diabetic Labs (most recent): No results found for: HGBA1C, MICROALBUR  Lipid Panel     Component Value Date/Time   CHOL 154 10/15/2023 1258   CHOL 166 04/09/2015 0957   TRIG 93 10/15/2023 1258   HDL 71 10/15/2023 1258   HDL 76 04/09/2015 0957   CHOLHDL 2.2 10/15/2023 1258   VLDL 19 10/15/2023 1258   LDLCALC 64 10/15/2023 1258   LDLCALC 74 04/09/2015 0957   LABVLDL 16 04/09/2015 0957     Lab Results  Component Value Date   TSH 91.021 (H) 10/15/2023   TSH 128.00 (A) 08/22/2023   TSH 0.008 (L) 06/16/2023   TSH 18.800 (H) 04/06/2023   TSH <0.010 (L) 03/20/2023   TSH <0.010 (L) 02/20/2023   TSH 0.01 (A) 01/08/2023   TSH 3.39 06/13/2022   TSH 2.96 08/14/2021   FREET4 <0.25 (L) 10/15/2023   FREET4 2.75 (H) 06/16/2023   FREET4 0.16 (L) 04/06/2023   FREET4 0.61 03/20/2023   FREET4 3.95 (H) 02/20/2023      Latest Reference Range & Units 04/06/23 10:13 06/16/23 14:47 08/22/23 10:15 10/15/23 12:55  TSH 0.350 - 4.500 uIU/mL 18.800 (H) 0.008 (L) 128.00 ! (E) 91.021 (H)  Triiodothyronine,Free,Serum 2.0 - 4.4 pg/mL 2.1 13.4 (H)  2.5  T4,Free(Direct) 0.61 - 1.12 ng/dL 9.83 (L) 7.24 (H)  <9.74 (L)  Thyroperoxidase Ab SerPl-aCnc 0 - 34 IU/mL  25     Thyroglobulin Antibody 0.0 - 0.9 IU/mL 52.5 (H)     (H): Data is abnormally high (L): Data is abnormally low !: Data is abnormal (E): External lab result   Assessment & Plan:   1. Hyperthyroidism (Primary)  she is being seen at a kind request of Debra Satterfield, MD.  her history and most recent labs are reviewed, and she was examined clinically. Subjective and objective findings are consistent  with thyrotoxicosis likely from primary hyperthyroidism. The potential risks of untreated thyrotoxicosis and the need for definitive therapy have been discussed in detail with her, and she agrees to proceed with diagnostic workup and treatment plan.   Her repeat thyroid  function tests show great improvement on Methimazole , so much so, we can continue to reduce her dose.  Will lower Methimazole  to 5 mg po ONCE daily and repeat labs in 8 weeks with follow up in office.  Will determine if she is then able to stop and have the uptake and scan.  Her thyroid  antibodies were positive, indicating autoimmune thyroid  dysfunction.   She is already on a beta-blocker which may help with some hyperactive thyroid  symptoms.    -Patient is advised to maintain close follow up with Debra Satterfield, MD for primary care needs.  She notes she has appt in January with [facility] but is concerned that she will not be able to get her nerve medication.  I encouraged her to reach out to behavioral health as they may be able to help her in the interim.    I spent  38  minutes in the care of the patient today including review of labs from Thyroid  Function, CMP, and other relevant labs ; imaging/biopsy records (current and previous including abstractions from other facilities); face-to-face time discussing  her lab results and symptoms, medications doses, her options of short and long term treatment based on the latest standards of care / guidelines;   and documenting the encounter.  Debra Graves  participated in the discussions, expressed understanding, and voiced agreement with the above plans.  All questions were answered to her satisfaction. she is encouraged to contact clinic should she have any questions or concerns prior to her return visit.  Follow up plan: Return in about 8 weeks (around 12/16/2023) for Thyroid  follow up, Previsit labs.   Thank you for involving me in the care of this pleasant  patient, and I will continue to update you with her progress.  Benton Rio, [facility] [address] [place], [place] [address] Phone: [phone] Fax: [phone]  10/21/2023, 1:43 PM

## 2023-10-22 ENCOUNTER — Other Ambulatory Visit: Payer: Self-pay | Admitting: Cardiology

## 2023-10-23 MED ORDER — ROSUVASTATIN CALCIUM 40 MG PO TABS: 40.0000 mg | ORAL_TABLET | Freq: Every day | ORAL | 3 refills | Status: DC

## 2023-10-24 ENCOUNTER — Other Ambulatory Visit: Payer: Self-pay | Admitting: Pharmacist

## 2023-10-26 ENCOUNTER — Other Ambulatory Visit: Payer: Self-pay

## 2023-10-26 ENCOUNTER — Telehealth: Payer: Self-pay | Admitting: Cardiology

## 2023-10-26 MED ORDER — ELIQUIS 5 MG PO TABS: 5.0000 mg | ORAL_TABLET | Freq: Two times a day (BID) | ORAL | 1 refills | Status: DC

## 2023-10-26 NOTE — Telephone Encounter (Signed)
 Prescription refill request for Eliquis  received. Indication:AFIB Last office visit:9/25 Scr:1.24  9/25 Age: 68 Weight:101.8  kg  Prescription refilled

## 2023-10-26 NOTE — Telephone Encounter (Signed)
*  STAT* If patient is at the pharmacy, call can be transferred to refill team.   1. Which medications need to be refilled? (please list name of each medication and dose if known) ELIQUIS  5 MG TABS tablet   2. Which pharmacy/location (including street and city if local pharmacy) is medication to be sent to?  [facility] - [place], [place] - [address]    3. Do they need a 30 day or 90 day supply? 90

## 2023-11-12 ENCOUNTER — Other Ambulatory Visit: Payer: Self-pay

## 2023-11-24 ENCOUNTER — Ambulatory Visit: Payer: Self-pay

## 2023-11-24 NOTE — Telephone Encounter (Signed)
 FYI Only or Action Required?: FYI only for provider: appointment scheduled on 11/25/23.  Patient was last seen in primary care on Not established with [facility], previous PCP outside [facility] network left practice.  Called Nurse Triage reporting Medication Problem.  Symptoms began Denies.  Interventions attempted: Other: No triage.  Symptoms are: stable.  Triage Disposition: Information or Advice Only Call  Patient/caregiver understands and will follow disposition?:   Copied from CRM #[id number]. Topic: Clinical - Red Word Triage >> Nov 24, 2023  9:41 AM Rosaria BRAVO wrote: Red Word that prompted transfer to Nurse Triage: Mental health concerns, PCP left without any notice and patient is upset that she will be at risk for not having any of her psych meds. Says she is worried about her mental health Reason for Disposition  Caller has medicine question only, adult not sick, AND triager answers question  Answer Assessment - Initial Assessment Questions Pt scheduled NP appt tomorrow at [facility] tp establish care and be able to get medications re-filled. Denies any new symptoms or changes to her baseline anxiety. Reports feeling stable/fine at this time.  1. NAME of MEDICINE: What medicine(s) are you calling about?     Cymbalta, Ativan , Klonopin   2. QUESTION: What is your question? (e.g., double dose of medicine, side effect)     Pt no longer with PCP Dr. Jerilynn Carnes (outside [facility] network). Pt concerned about how she will get the above meds filled. Reports she only has a week supply left. Reports having a larger reserve of her other medications on hand.  3. PRESCRIBER: Who prescribed the medicine? Reason: if prescribed by specialist, call should be referred to that group.     Dr. Jerilynn Carnes  4. SYMPTOMS: Do you have any symptoms? If Yes, ask: What symptoms are you having?  How bad are the symptoms (e.g., mild, moderate, severe)     Increased anxiety  about how she will be able to get her meds filled now that she is no longer established.  Protocols used: Medication Question Call-A-AH

## 2023-11-25 ENCOUNTER — Ambulatory Visit: Admitting: Family Medicine

## 2023-11-25 ENCOUNTER — Encounter: Payer: Self-pay | Admitting: Family Medicine

## 2023-11-25 VITALS — BP 132/72 | HR 84 | Temp 97.7°F | Ht 66.0 in | Wt 221.5 lb

## 2023-11-25 DIAGNOSIS — K219 Gastro-esophageal reflux disease without esophagitis: Secondary | ICD-10-CM

## 2023-11-25 DIAGNOSIS — I480 Paroxysmal atrial fibrillation: Secondary | ICD-10-CM

## 2023-11-25 DIAGNOSIS — Z1212 Encounter for screening for malignant neoplasm of rectum: Secondary | ICD-10-CM

## 2023-11-25 DIAGNOSIS — E7800 Pure hypercholesterolemia, unspecified: Secondary | ICD-10-CM

## 2023-11-25 DIAGNOSIS — Z87891 Personal history of nicotine dependence: Secondary | ICD-10-CM

## 2023-11-25 DIAGNOSIS — Z1211 Encounter for screening for malignant neoplasm of colon: Secondary | ICD-10-CM

## 2023-11-25 DIAGNOSIS — N289 Disorder of kidney and ureter, unspecified: Secondary | ICD-10-CM

## 2023-11-25 DIAGNOSIS — F419 Anxiety disorder, unspecified: Secondary | ICD-10-CM

## 2023-11-25 DIAGNOSIS — Z7689 Persons encountering health services in other specified circumstances: Secondary | ICD-10-CM

## 2023-11-25 DIAGNOSIS — I2510 Atherosclerotic heart disease of native coronary artery without angina pectoris: Secondary | ICD-10-CM

## 2023-11-25 DIAGNOSIS — Z23 Encounter for immunization: Secondary | ICD-10-CM

## 2023-11-25 DIAGNOSIS — E0590 Thyrotoxicosis, unspecified without thyrotoxic crisis or storm: Secondary | ICD-10-CM

## 2023-11-25 MED ORDER — DULOXETINE HCL 60 MG PO CPEP: 60.0000 mg | ORAL_CAPSULE | Freq: Every day | ORAL | 1 refills | Status: DC

## 2023-11-25 MED ORDER — CLONAZEPAM 0.5 MG PO TABS: 0.5000 mg | ORAL_TABLET | Freq: Every day | ORAL | 1 refills | Status: DC

## 2023-11-25 MED ORDER — LORAZEPAM 1 MG PO TABS: 1.0000 mg | ORAL_TABLET | Freq: Three times a day (TID) | ORAL | 0 refills | Status: DC | PRN

## 2023-11-25 NOTE — Progress Notes (Signed)
 Patient Office Visit  Assessment & Plan:  Encounter to establish care  Anxiety -     DULoxetine HCl; Take 1 capsule (60 mg total) by mouth daily.  Dispense: 90 capsule; Refill: 1 -     LORazepam ; Take 1 tablet (1 mg total) by mouth every 8 (eight) hours as needed for anxiety.  Dispense: 90 tablet; Refill: 0  Hypercholesteremia  Coronary artery disease involving native coronary artery of native heart without angina pectoris  Gastroesophageal reflux disease, unspecified whether esophagitis present  Screening for colorectal cancer  Need for pneumococcal 20-valent conjugate vaccination -     Pneumococcal conjugate vaccine 20-valent  Needs flu shot -     Flu vaccine HIGH DOSE PF(Fluzone Trivalent)  Hyperthyroidism  Paroxysmal atrial fibrillation (HCC)  Abnormal kidney function -     Comprehensive metabolic panel with GFR  Former smoker -     CT CHEST LUNG CANCER SCREENING LOW DOSE WO CONTRAST; Future   Assessment and Plan         Test results were reviewed and analyzed as part of the medical decision making of this visit.  Reviewed previous notes during the office visit.  Reviewed recent labs that were done which revealed abnormal kidney function. Recommend healthy diet i.e mediterranean/DASH diet, consistent exercise - 30 minutes 5 day per week, and gradual weight loss. Follow-up on lab work notify patient.  CT lung cancer screening ordered.  Will have her stop the Klonopin for now and continue the Ativan  1 mg 3 times a day rather than 4 times a day.  We can consider BuSpar in the future but we will reduce the Ativan  slowly since she has been on it over 20 years.  Continue Cymbalta 60 mg once a day.  High-dose flu shot and pneumococcal vaccine given today. Return in about 5 weeks (around 12/30/2023), or if symptoms worsen or fail to improve, for hypertension.   Subjective:    Patient ID: Debra Graves, female    DOB: 1955-02-06  Age: 68 y.o. MRN: [id number]  Chief  Complaint  Patient presents with   Medical Management of Chronic Issues   Establish Care    HPI Discussed the use of AI scribe software for clinical note transcription with the patient, who gave verbal consent to proceed.  History of Present Illness        History of Present Illness Debra Graves is a 68 year old female with hyperthyroidism and anxiety who presents for a new patient visit. Patient's previous PCP has retired.   She has been managing hyperthyroidism with medication Tapazole  for several months, though her condition remains unregulated. She has experienced significant weight loss associated with her thyroid  issues and is scheduled for a follow-up with endocrinology in December. TSH was elevated last time  She has a long-standing history of anxiety and panic attacks, managed with lorazepam  four times a day for approximately 20 years. She also takes Klonopin as needed.  She has not experienced panic attacks since starting these medications. Additionally, she takes Cymbalta for mood stabilization, which has been beneficial.  She was diagnosed with paroxysmal atrial fibrillation about two years ago and is currently on Eliquis  and metoprolol  for management. She can feel when she is in atrial fibrillation, noting palpitations and a recent heart rate of 160 bpm.  She experiences chronic back pain due to a herniated disc and bone spurs. She was prescribed gabapentin and tramadol but is not currently taking them due to concerns about interactions with  her anxiety medications. Patient now aware that combo of Tramadol with Klonopin/Ativan  not a good option.   She has chronic kidney disease, stage 3, and is trying to stay hydrated by drinking more water  and reducing soda and tea intake. She also has a history of fatty liver and adrenal insufficiency, for which she takes hydrocortisone.  She quit smoking several months ago after smoking a pack a day for 20-30 years. She has not had a lung  cancer screening CT scan yet.  She has a history of reflux and takes Nexium  twice a day, which she finds effective. She also takes Crestor  for cholesterol management and is under the care of a cardiologist for her atrial fibrillation.  Results LABS Kidney Function: Stage 3 chronic kidney disease Liver Function: Fatty liver  DIAGNOSTIC Atrial Fibrillation: Heart rate 160 bpm (10/2023)  Assessment and Plan Adult Wellness Visit Routine wellness visit focused on preventive care and screenings. - Administered pneumonia vaccine. - Administered high-dose flu vaccine. - Ordered colonoscopy. - Ordered lung cancer screening CT scan.  Anxiety and Depressive Disorders Long-standing anxiety and depressive disorders managed with Ativan  and Cymbalta. Discussed risks of long-term benzodiazepine use, including increased dementia risk. Klonopin discontinued due to redundancy with Ativan . - Discontinued Klonopin. - Continue Ativan , reduce to three times a day. - Continue Cymbalta. - Consider Buspar as an alternative.  Paroxysmal Atrial Fibrillation Paroxysmal atrial fibrillation managed with metoprolol  and Eliquis . Recent episode with heart rate of 160 bpm. Emphasized importance of anticoagulation to prevent stroke. - Continue metoprolol . - Continue Eliquis .  Chronic Kidney Disease, Stage 3 Chronic kidney disease stage 3 with previous abnormal kidney function. Advised on lifestyle modifications to support kidney health. - Rechecked kidney function. - Advised to avoid NSAIDs. - Encouraged increased water  intake. - Advised to reduce soda and tea consumption.  Pure Hypercholesterolemia Hypercholesterolemia managed with Crestor . - Continue Crestor .  Atherosclerotic Heart Disease of Native Coronary Artery without Angina Atherosclerotic heart disease without angina. Emphasized importance of cholesterol management and lifestyle modifications. - Continue Crestor .  Gastroesophageal Reflux  Disease GERD managed with Nexium . Discussed potential kidney impact of Nexium  but emphasized necessity for symptom control. - Continue Nexium .  Thyrotoxicosis (Hyperthyroidism) Hyperthyroidism managed with methimazole . Symptoms improved but not fully regulated. Follow-up with endocrinology scheduled. - Follow up with endocrinology.  Fatty Liver Disease Fatty liver disease noted. Advised on lifestyle modifications to manage condition. - Advised weight loss and dietary changes.  Chronic Back Pain due to Herniated Disc and Bone Spurs Chronic back pain due to herniated disc and bone spurs. Advised against tramadol due to interaction with Klonopin. Gabapentin recommended as alternative. - Recommended gabapentin for pain management.  Adrenal Insufficiency Adrenal insufficiency managed with hydrocortisone. - Continue hydrocortisone.    The 10-year ASCVD risk score (Arnett DK, et al., 2019) is: 10.4%  Past Medical History:  Diagnosis Date   Adrenal insufficiency    Anxiety    Aortic insufficiency    Arthritis    Back pain    CAD (coronary artery disease)    Moderate ostial RCA disease at cardiac catheterization 2006   Constipation    Depression    Fibroids    Hypercholesteremia    Mild carotid artery disease    Rectocele    Past Surgical History:  Procedure Laterality Date   CARDIAC CATHETERIZATION  03/08/2004   mild progressive CAD   carotid duplex  03/09/2007   rt subclavian 0 to 49%,right bulb 0 to 49 %,lf ICA normal   COLONOSCOPY N/A 06/16/2013  Dr. Shaaron: hyperplastic polyps, melanosis coli    DOPPLER ECHOCARDIOGRAPHY  03/20/2010   EF >55 %,trace mitral regurg,mild tricuspid regurg,aortic valve mildly sclerotic'mild to moderate aortic regurg,trace pulmonic valver regurg   NM MYOCAR PERF WALL MOTION  03/19/06   EF 61%,low risk study   Social History   Tobacco Use   Smoking status: Former    Current packs/day: 0.50    Average packs/day: 0.5 packs/day for 41.0 years  (20.5 ttl pk-yrs)    Types: Cigarettes   Smokeless tobacco: Never  Vaping Use   Vaping status: Never Used  Substance Use Topics   Alcohol use: No   Drug use: No   Family History  Problem Relation Age of Onset   Breast cancer Mother    Cancer Mother 22       Breast then bones and all over   Coronary artery disease Father    Heart disease Father    Stroke Father    Hypertension Sister    Breast cancer Sister 57   Heart disease Sister    Stroke Brother    Coronary artery disease Brother    Coronary artery disease Brother    Coronary artery disease Brother    Colon cancer Neg Hx    Allergies  Allergen Reactions   Albuterol    Bactrim [Sulfamethoxazole-Trimethoprim]    Vytorin [Ezetimibe -Simvastatin]     Myalgias     ROS    Objective:    BP 132/72   Pulse 84   Temp 97.7 F (36.5 C)   Ht 5' 6 (1.676 m)   Wt 221 lb 8 oz (100.5 kg)   SpO2 96%   BMI 35.75 kg/m  BP Readings from Last 3 Encounters:  11/25/23 132/72  10/21/23 118/74  09/28/23 118/66   Wt Readings from Last 3 Encounters:  11/25/23 221 lb 8 oz (100.5 kg)  10/21/23 224 lb 6.4 oz (101.8 kg)  09/28/23 219 lb 3.2 oz (99.4 kg)    Physical Exam Vitals and nursing note reviewed.  Constitutional:      General: She is not in acute distress.    Appearance: Normal appearance.  HENT:     Head: Normocephalic.     Right Ear: Tympanic membrane, ear canal and external ear normal.     Left Ear: Tympanic membrane, ear canal and external ear normal.  Eyes:     Extraocular Movements: Extraocular movements intact.     Pupils: Pupils are equal, round, and reactive to light.  Cardiovascular:     Rate and Rhythm: Normal rate and regular rhythm.     Heart sounds: Normal heart sounds.  Pulmonary:     Effort: Pulmonary effort is normal.     Breath sounds: Normal breath sounds.  Musculoskeletal:     Right lower leg: No edema.     Left lower leg: No edema.  Neurological:     General: No focal deficit present.      Mental Status: She is alert and oriented to person, place, and time. Mental status is at baseline.  Psychiatric:        Attention and Perception: Attention normal.        Mood and Affect: Mood is anxious.        Speech: Speech normal.        Behavior: Behavior normal.        Thought Content: Thought content normal.        Cognition and Memory: Cognition normal.  Judgment: Judgment normal.      No results found for any visits on 11/25/23.

## 2023-11-26 LAB — COMPREHENSIVE METABOLIC PANEL WITH GFR
AG Ratio: 1.4 (calc) (ref 1.0–2.5)
ALT: 13 U/L (ref 6–29)
AST: 15 U/L (ref 10–35)
Albumin: 3.6 g/dL (ref 3.6–5.1)
Alkaline phosphatase (APISO): 58 U/L (ref 37–153)
BUN/Creatinine Ratio: 12 (calc) (ref 6–22)
BUN: 15 mg/dL (ref 7–25)
CO2: 30 mmol/L (ref 20–32)
Calcium: 9.5 mg/dL (ref 8.6–10.4)
Chloride: 108 mmol/L (ref 98–110)
Creat: 1.25 mg/dL — ABNORMAL HIGH (ref 0.50–1.05)
Globulin: 2.6 g/dL (ref 1.9–3.7)
Glucose, Bld: 95 mg/dL (ref 65–99)
Potassium: 4.7 mmol/L (ref 3.5–5.3)
Sodium: 142 mmol/L (ref 135–146)
Total Bilirubin: 0.3 mg/dL (ref 0.2–1.2)
Total Protein: 6.2 g/dL (ref 6.1–8.1)
eGFR: 47 mL/min/1.73m2 — ABNORMAL LOW (ref >=60)

## 2023-11-27 ENCOUNTER — Ambulatory Visit: Payer: Self-pay | Admitting: Family Medicine

## 2023-11-27 NOTE — Progress Notes (Signed)
 Lvm for pt to return call

## 2023-12-07 ENCOUNTER — Telehealth: Payer: Self-pay | Admitting: Cardiology

## 2023-12-07 NOTE — Telephone Encounter (Signed)
 Error

## 2023-12-07 NOTE — Telephone Encounter (Signed)
 Pt c/o BP issue: STAT if pt c/o blurred vision, one-sided weakness or slurred speech  1. What are your last 5 BP readings?Yesterday afternoon, 132/55 Then an hour later 127/52  2. Are you having any other symptoms (ex. Dizziness, headache, blurred vision, passed out)? No, head did not feel right  3. What is your BP issue? Patient is concerned the bottom number is too low

## 2023-12-07 NOTE — Telephone Encounter (Signed)
 Patient notified and will continue medication and tracking bp results. Pt had no further questions or concerns at this time.

## 2023-12-07 NOTE — Telephone Encounter (Signed)
 Spoke to pt who started having some light headedness on 11/16. Pt stated that her bp this morning was 140/61 (has not taken medication this morning). Pt stated yesterday that bp 132/55, then 127/57 an hour later. Pt denied cp, sob, n/v. Pt stated that today she feels fine.   Please advise

## 2023-12-14 ENCOUNTER — Other Ambulatory Visit (HOSPITAL_COMMUNITY)
Admission: RE | Admit: 2023-12-14 | Discharge: 2023-12-14 | Disposition: A | Discharge status: Home / Self Care | Source: Ambulatory Visit | Attending: Nurse Practitioner | Admitting: Nurse Practitioner

## 2023-12-14 DIAGNOSIS — E0590 Thyrotoxicosis, unspecified without thyrotoxic crisis or storm: Secondary | ICD-10-CM | POA: Insufficient documentation

## 2023-12-14 LAB — TSH: TSH: <0.1 u[IU]/mL — ABNORMAL LOW (ref 0.350–4.500)

## 2023-12-14 LAB — T4, FREE: Free T4: 0.61 ng/dL (ref 0.61–1.12)

## 2023-12-22 ENCOUNTER — Encounter: Payer: Self-pay | Admitting: Nurse Practitioner

## 2023-12-22 ENCOUNTER — Ambulatory Visit: Admitting: Nurse Practitioner

## 2023-12-22 VITALS — BP 128/74 | HR 79 | Ht 66.0 in | Wt 221.4 lb

## 2023-12-22 DIAGNOSIS — E0590 Thyrotoxicosis, unspecified without thyrotoxic crisis or storm: Secondary | ICD-10-CM

## 2023-12-22 MED ORDER — METHIMAZOLE 5 MG PO TABS: 5.0000 mg | ORAL_TABLET | Freq: Every day | ORAL | 1 refills | Status: DC

## 2023-12-22 NOTE — Progress Notes (Signed)
 12/22/2023     Endocrinology Follow Up Note    Subjective:    Patient ID: Debra Graves, female    DOB: January 17, 1956, PCP Aletha Bene, MD.   Past Medical History:  Diagnosis Date   Adrenal insufficiency    Anxiety    Aortic insufficiency    Arthritis    Back pain    CAD (coronary artery disease)    Moderate ostial RCA disease at cardiac catheterization 2006   Constipation    Depression    Fibroids    Hypercholesteremia    Mild carotid artery disease    Rectocele     Past Surgical History:  Procedure Laterality Date   CARDIAC CATHETERIZATION  03/08/2004   mild progressive CAD   carotid duplex  03/09/2007   rt subclavian 0 to 49%,right bulb 0 to 49 %,lf ICA normal   COLONOSCOPY N/A 06/16/2013   Dr. Shaaron: hyperplastic polyps, melanosis coli    DOPPLER ECHOCARDIOGRAPHY  03/20/2010   EF >55 %,trace mitral regurg,mild tricuspid regurg,aortic valve mildly sclerotic'mild to moderate aortic regurg,trace pulmonic valver regurg   NM MYOCAR PERF WALL MOTION  03/19/06   EF 61%,low risk study    Social History   Socioeconomic History   Marital status: Divorced    Spouse name: Not on file   Number of children: Not on file   Years of education: Not on file   Highest education level: Not on file  Occupational History   Occupation: disability    Employer: UNEMPLOYED  Tobacco Use   Smoking status: Former    Current packs/day: 0.50    Average packs/day: 0.5 packs/day for 41.0 years (20.5 ttl pk-yrs)    Types: Cigarettes   Smokeless tobacco: Never  Vaping Use   Vaping status: Never Used  Substance and Sexual Activity   Alcohol use: No   Drug use: No   Sexual activity: Not Currently    Birth control/protection: None, Post-menopausal    Comment: once in 9 months  Other Topics Concern   Not on file  Social History Narrative   Drinks 3 cups of coffee daily.   Social Drivers of Corporate Investment Banker Strain: Not on file  Food Insecurity: Not on file   Transportation Needs: Not on file  Physical Activity: Not on file  Stress: Not on file  Social Connections: Not on file    Family History  Problem Relation Age of Onset   Breast cancer Mother    Cancer Mother 51       Breast then bones and all over   Coronary artery disease Father    Heart disease Father    Stroke Father    Hypertension Sister    Breast cancer Sister 58   Heart disease Sister    Stroke Brother    Coronary artery disease Brother    Coronary artery disease Brother    Coronary artery disease Brother    Colon cancer Neg Hx     Outpatient Encounter Medications as of 12/22/2023  Medication Sig   Docusate Calcium  (STOOL SOFTENER PO) Take 2 tablets by mouth daily.   DULoxetine  (CYMBALTA ) 60 MG capsule Take 1 capsule (60 mg total) by mouth daily.   ELIQUIS  5 MG TABS tablet Take 1 tablet (5 mg total) by mouth 2 (two) times daily.   esomeprazole  (NEXIUM ) 20 MG capsule TAKE ONE CAPSULE BY MOUTH TWICE DAILY BEFORE a meal   ezetimibe  (ZETIA ) 10 MG tablet Take 1 tablet (10 mg  total) by mouth daily.   fish oil-omega-3 fatty acids 1000 MG capsule Take 2 g by mouth daily.   furosemide  (LASIX ) 20 MG tablet Take 1 tablet (20 mg total) by mouth 2 (two) times daily.   hydrocortisone (CORTEF) 10 MG tablet Take 10 mg by mouth 2 (two) times daily.   LORazepam  (ATIVAN ) 1 MG tablet Take 1 tablet (1 mg total) by mouth every 8 (eight) hours as needed for anxiety.   MAGNESIUM  PO Take by mouth daily.   meloxicam (MOBIC) 7.5 MG tablet Take 7.5 mg by mouth 2 (two) times daily.   metoprolol  succinate (TOPROL -XL) 100 MG 24 hr tablet Take 1 tablet (100 mg total) by mouth daily. Take with or immediately following a meal.   potassium chloride  SA (KLOR-CON  M) 20 MEQ tablet Take 20 mEq by mouth daily.   rosuvastatin  (CRESTOR ) 40 MG tablet Take 1 tablet (40 mg total) by mouth daily.   tiZANidine (ZANAFLEX) 4 MG capsule Take 4 mg by mouth every 6 (six) hours.   traMADol (ULTRAM) 50 MG tablet Take 50  mg by mouth every 6 (six) hours.   Vitamin D, Ergocalciferol, 2000 units CAPS Take 1 capsule by mouth daily.   [DISCONTINUED] methimazole  (TAPAZOLE ) 5 MG tablet Take 1 tablet (5 mg total) by mouth daily.   methimazole  (TAPAZOLE ) 5 MG tablet Take 1 tablet (5 mg total) by mouth daily.   [DISCONTINUED] gabapentin (NEURONTIN) 300 MG capsule Take 300 mg by mouth 3 (three) times daily. (Patient not taking: Reported on 12/22/2023)   No facility-administered encounter medications on file as of 12/22/2023.    ALLERGIES: Allergies  Allergen Reactions   Albuterol    Bactrim [Sulfamethoxazole-Trimethoprim]    Vytorin [Ezetimibe -Simvastatin]     Myalgias     VACCINATION STATUS: Immunization History  Administered Date(s) Administered   INFLUENZA, HIGH DOSE SEASONAL PF 11/25/2023   PNEUMOCOCCAL CONJUGATE-20 11/25/2023   Pneumococcal Conjugate-13 08/01/2019   Td 05/15/2020     HPI  Debra Graves is 68 y.o. female who presents today with a medical history as above. she is being seen in follow up after being seen in consultation for hyperthyroidism requested by her cardiologist Laymon Qua, PA as she is being seen for persistent Afib.  she has been dealing with symptoms of unexplained weight loss, fatigue, heart palpitations, shortness of breath, tremors, diarrhea, muscle weakness, and anxiety for about 5-6 months. These symptoms are progressively worsening and troubling to her.  her most recent thyroid  labs revealed suppressed TSH of < 0.01, elevated FT4 of 3.95 and elevated FT3 of 16.9 on 02/20/23.  she denies dysphagia, choking, shortness of breath, no recent voice change.    she does have family history of thyroid  dysfunction in her sister and niece, but denies family hx of thyroid  cancer. she denies personal history of goiter. she is not on any anti-thyroid  medications nor on any thyroid  hormone supplements. Denies use of Biotin containing supplements.    Review of  systems  Constitutional: + Minimally fluctuating body weight,  current Body mass index is 35.73 kg/m. , + fatigue, no subjective hyperthermia, no subjective hypothermia Eyes: no blurry vision, no xerophthalmia ENT: no sore throat, no nodules palpated in throat, no dysphagia/odynophagia, no hoarseness Cardiovascular: no chest pain, no shortness of breath, no palpitations, no leg swelling Respiratory: no cough, no shortness of breath Gastrointestinal: no nausea/vomiting/diarrhea Musculoskeletal: no muscle/joint aches Skin: no rashes, no hyperemia Neurological: + tremors, no numbness, no tingling, no dizziness Psychiatric: no depression, + anxiety  Objective:    BP 128/74 (BP Location: Left Arm, Patient Position: Sitting, Cuff Size: Large)   Pulse 79   Ht 5' 6 (1.676 m)   Wt 221 lb 6.4 oz (100.4 kg)   BMI 35.73 kg/m   Wt Readings from Last 3 Encounters:  12/22/23 221 lb 6.4 oz (100.4 kg)  11/25/23 221 lb 8 oz (100.5 kg)  10/21/23 224 lb 6.4 oz (101.8 kg)     BP Readings from Last 3 Encounters:  12/22/23 128/74  11/25/23 132/72  10/21/23 118/74                        Physical Exam- Limited  Constitutional:  Body mass index is 35.73 kg/m. , not in acute distress, anxious state of mind Eyes:  EOMI, no exophthalmos Musculoskeletal: no gross deformities, strength intact in all four extremities, no gross restriction of joint movements Skin:  no rashes, no hyperemia Neurological: + tremor with outstretched hands   CMP     Component Value Date/Time   NA 142 11/25/2023 1113   NA 143 04/09/2015 0957   K 4.7 11/25/2023 1113   CL 108 11/25/2023 1113   CO2 30 11/25/2023 1113   GLUCOSE 95 11/25/2023 1113   BUN 15 11/25/2023 1113   BUN 12 04/09/2015 0957   CREATININE 1.25 (H) 11/25/2023 1113   CALCIUM  9.5 11/25/2023 1113   PROT 6.2 11/25/2023 1113   PROT 6.3 04/09/2015 0957   ALBUMIN 3.6 10/15/2023 1258   ALBUMIN 4.1 04/09/2015 0957   AST 15 11/25/2023 1113   ALT 13  11/25/2023 1113   ALKPHOS 67 10/15/2023 1258   BILITOT 0.3 11/25/2023 1113   BILITOT 0.4 04/09/2015 0957   EGFR 47 (L) 11/25/2023 1113   GFRNONAA 47 (L) 10/15/2023 1258     CBC    Component Value Date/Time   WBC 8.8 03/20/2023 0406   RBC 4.36 03/20/2023 0406   HGB 12.5 03/20/2023 0406   HCT 37.9 03/20/2023 0406   PLT 251 03/20/2023 0406   MCV 86.9 03/20/2023 0406   MCH 28.7 03/20/2023 0406   MCHC 33.0 03/20/2023 0406   RDW 13.2 03/20/2023 0406     Diabetic Labs (most recent): No results found for: HGBA1C, MICROALBUR  Lipid Panel     Component Value Date/Time   CHOL 154 10/15/2023 1258   CHOL 166 04/09/2015 0957   TRIG 93 10/15/2023 1258   HDL 71 10/15/2023 1258   HDL 76 04/09/2015 0957   CHOLHDL 2.2 10/15/2023 1258   VLDL 19 10/15/2023 1258   LDLCALC 64 10/15/2023 1258   LDLCALC 74 04/09/2015 0957   LABVLDL 16 04/09/2015 0957     Lab Results  Component Value Date   TSH <0.100 (L) 12/14/2023   TSH 91.021 (H) 10/15/2023   TSH 128.00 (A) 08/22/2023   TSH 0.008 (L) 06/16/2023   TSH 18.800 (H) 04/06/2023   TSH <0.010 (L) 03/20/2023   TSH <0.010 (L) 02/20/2023   TSH 0.01 (A) 01/08/2023   TSH 3.39 06/13/2022   TSH 2.96 08/14/2021   FREET4 0.61 12/14/2023   FREET4 <0.25 (L) 10/15/2023   FREET4 2.75 (H) 06/16/2023   FREET4 0.16 (L) 04/06/2023   FREET4 0.61 03/20/2023   FREET4 3.95 (H) 02/20/2023      Latest Reference Range & Units 06/16/23 14:47 08/22/23 10:15 10/15/23 12:55 12/14/23 14:56  TSH 0.350 - 4.500 uIU/mL 0.008 (L) 128.00 ! (E) 91.021 (H) <0.100 (L)  Triiodothyronine,Free,Serum 2.0 - 4.4 pg/mL 13.4 (  H)  2.5   T4,Free(Direct) 0.61 - 1.12 ng/dL 7.24 (H)  <9.74 (L) 9.38  (L): Data is abnormally low !: Data is abnormal (H): Data is abnormally high (E): External lab result   Assessment & Plan:   1. Hyperthyroidism (Primary)  she is being seen at a kind request of Aletha Bene, MD.  her history and most recent labs are reviewed, and she  was examined clinically. Subjective and objective findings are consistent with thyrotoxicosis likely from primary hyperthyroidism. The potential risks of untreated thyrotoxicosis and the need for definitive therapy have been discussed in detail with her, and she agrees to proceed with diagnostic workup and treatment plan.   Her repeat thyroid  function tests show great improvement on Methimazole  but we are not yet at a point where we can stop and perform the uptake and scan.  I did advise to continue Methimazole  5 mg po daily for now, will repeat labs in 3 months and reassess timing for uptake and scan.  Her thyroid  antibodies were positive, indicating autoimmune thyroid  dysfunction.   She is already on a beta-blocker which may help with some hyperactive thyroid  symptoms.    -Patient is advised to maintain close follow up with Aguiar, Rafaela, MD for primary care needs.      I spent  42  minutes in the care of the patient today including review of labs from Thyroid  Function, CMP, and other relevant labs ; imaging/biopsy records (current and previous including abstractions from other facilities); face-to-face time discussing  her lab results and symptoms, medications doses, her options of short and long term treatment based on the latest standards of care / guidelines;   and documenting the encounter.  Debra Graves  participated in the discussions, expressed understanding, and voiced agreement with the above plans.  All questions were answered to her satisfaction. she is encouraged to contact clinic should she have any questions or concerns prior to her return visit.  Follow up plan: Return in about 3 months (around 03/21/2024) for Thyroid  follow up, Previsit labs.   Thank you for involving me in the care of this pleasant patient, and I will continue to update you with her progress.  Benton Rio, [facility] [address] [place], [place]  [address] Phone: [phone] Fax: [phone]  12/22/2023, 1:41 PM

## 2023-12-24 ENCOUNTER — Ambulatory Visit: Payer: Self-pay

## 2023-12-24 ENCOUNTER — Other Ambulatory Visit: Payer: Self-pay | Admitting: Nurse Practitioner

## 2023-12-24 ENCOUNTER — Telehealth: Payer: Self-pay | Admitting: Acute Care

## 2023-12-24 DIAGNOSIS — Z122 Encounter for screening for malignant neoplasm of respiratory organs: Secondary | ICD-10-CM

## 2023-12-24 DIAGNOSIS — Z87891 Personal history of nicotine dependence: Secondary | ICD-10-CM

## 2023-12-24 NOTE — Telephone Encounter (Signed)
 Lung Cancer Screening Narrative/Criteria Questionnaire (Cigarette Smokers Only- No Cigars/Pipes/vapes)   Debra Graves   SDMV:01/06/24@0945a /Katy                                           August 11, 1955               LDCT: 01/12/24@430p [name]    68 y.o.   Phone: [phone]  Lung Screening Narrative (confirm age 109-77 yrs Medicare / 50-80 yrs Private pay insurance)   Insurance information:[facility] / [facility]icaid   Referring Provider:Aguiar   This screening involves an initial phone call with a team member from our program. It is called a shared decision making visit. The initial meeting is required by insurance and Medicare to make sure you understand the program. This appointment takes about 15-20 minutes to complete. The CT scan will completed at a separate date/time. This scan takes about 5-10 minutes to complete and you may eat and drink before and after the scan.  Criteria questions for Lung Cancer Screening:   Are you a current or former smoker? Former Age began smoking: 17y   If you are a former smoker, what year did you quit smoking? 2023   To calculate your smoking history, I need an accurate estimate of how many packs of cigarettes you smoked per day and for how many years. (Not just the number of PPD you are now smoking)   Years smoking 49 x Packs per day 1 = Pack years 49   (at least 20 pack yrs)   (Make sure they understand that we need to know how much they have smoked in the past, not just the number of PPD they are smoking now)  Do you have a personal history of cancer?  No    Do you have a family history of cancer? Yes  (cancer type and and relative) mother/sister both breast cancer  Are you coughing up blood?  No  Have you had unexplained weight loss of 15 lbs or more in the last 6 months? No  It looks like you meet all criteria.     Additional information: N/A

## 2023-12-24 NOTE — Telephone Encounter (Signed)
 PCP (Dr. Bertell) was taking care of this.  Patient used to see Dr. Lenis but they didn't see eye to eye.  I told her during our visit that adrenal gland problems were not my area of expertise and said she may need to see him again for this.  I am not sure she wants to.

## 2023-12-25 ENCOUNTER — Telehealth: Payer: Self-pay

## 2023-12-25 NOTE — Telephone Encounter (Signed)
 Tried to return pt's call, did not receive an answer and was unable to leave a message due to voicemail being full.

## 2023-12-30 ENCOUNTER — Encounter: Payer: Self-pay | Admitting: Family Medicine

## 2023-12-30 ENCOUNTER — Ambulatory Visit: Admitting: Family Medicine

## 2023-12-30 VITALS — BP 134/86 | Temp 98.4°F | Ht 66.0 in | Wt 219.0 lb

## 2023-12-30 DIAGNOSIS — I480 Paroxysmal atrial fibrillation: Secondary | ICD-10-CM

## 2023-12-30 DIAGNOSIS — M545 Low back pain, unspecified: Secondary | ICD-10-CM

## 2023-12-30 DIAGNOSIS — F419 Anxiety disorder, unspecified: Secondary | ICD-10-CM

## 2023-12-30 DIAGNOSIS — Z23 Encounter for immunization: Secondary | ICD-10-CM

## 2023-12-30 DIAGNOSIS — E0590 Thyrotoxicosis, unspecified without thyrotoxic crisis or storm: Secondary | ICD-10-CM

## 2023-12-30 DIAGNOSIS — Z720 Tobacco use: Secondary | ICD-10-CM

## 2023-12-30 DIAGNOSIS — E7800 Pure hypercholesterolemia, unspecified: Secondary | ICD-10-CM | POA: Diagnosis not present

## 2023-12-30 DIAGNOSIS — E2740 Unspecified adrenocortical insufficiency: Secondary | ICD-10-CM

## 2023-12-30 DIAGNOSIS — G8929 Other chronic pain: Secondary | ICD-10-CM | POA: Diagnosis not present

## 2023-12-30 LAB — CBC WITH DIFFERENTIAL/PLATELET
Absolute Lymphocytes: 3331 {cells}/uL (ref 850–3900)
Absolute Monocytes: 1200 {cells}/uL — ABNORMAL HIGH (ref 200–950)
Basophils Absolute: 38 {cells}/uL (ref 0–200)
Basophils Relative: 0.4 %
Eosinophils Absolute: 182 {cells}/uL (ref 15–500)
Eosinophils Relative: 1.9 %
HCT: 35.2 % — ABNORMAL LOW (ref 35.9–46.0)
Hemoglobin: 11.4 g/dL — ABNORMAL LOW (ref 11.7–15.5)
MCH: 28.9 pg (ref 27.0–33.0)
MCHC: 32.4 g/dL (ref 31.6–35.4)
MCV: 89.3 fL (ref 81.4–101.7)
MPV: 10 fL (ref 7.5–12.5)
Monocytes Relative: 12.5 %
Neutro Abs: 4848 {cells}/uL (ref 1500–7800)
Neutrophils Relative %: 50.5 %
Platelets: 320 Thousand/uL (ref 140–400)
RBC: 3.94 Million/uL (ref 3.80–5.10)
RDW: 12.2 % (ref 11.0–15.0)
Total Lymphocyte: 34.7 %
WBC: 9.6 Thousand/uL (ref 3.8–10.8)

## 2023-12-30 LAB — COMPREHENSIVE METABOLIC PANEL WITH GFR
AG Ratio: 1.4 (calc) (ref 1.0–2.5)
ALT: 14 U/L (ref 6–29)
AST: 17 U/L (ref 10–35)
Albumin: 3.6 g/dL (ref 3.6–5.1)
Alkaline phosphatase (APISO): 70 U/L (ref 37–153)
BUN: 21 mg/dL (ref 7–25)
CO2: 27 mmol/L (ref 20–32)
Calcium: 9.3 mg/dL (ref 8.6–10.4)
Chloride: 104 mmol/L (ref 98–110)
Creat: 1.02 mg/dL (ref 0.50–1.05)
Globulin: 2.5 g/dL (ref 1.9–3.7)
Glucose, Bld: 132 mg/dL — ABNORMAL HIGH (ref 65–99)
Potassium: 4 mmol/L (ref 3.5–5.3)
Sodium: 139 mmol/L (ref 135–146)
Total Bilirubin: 0.4 mg/dL (ref 0.2–1.2)
Total Protein: 6.1 g/dL (ref 6.1–8.1)
eGFR: 60 mL/min/1.73m2 (ref >=60)

## 2023-12-30 LAB — LIPID PANEL
Cholesterol: 94 mg/dL (ref <200)
HDL: 49 mg/dL — ABNORMAL LOW (ref >=50)
LDL Cholesterol (Calc): 23 mg/dL
Non-HDL Cholesterol (Calc): 45 mg/dL (ref <130)
Total CHOL/HDL Ratio: 1.9 (calc) (ref <5.0)
Triglycerides: 139 mg/dL (ref <150)

## 2023-12-30 MED ORDER — TIZANIDINE HCL 4 MG PO CAPS: 4.0000 mg | ORAL_CAPSULE | Freq: Four times a day (QID) | ORAL | 1 refills | Status: DC

## 2023-12-30 MED ORDER — DULOXETINE HCL 30 MG PO CPEP: 30.0000 mg | ORAL_CAPSULE | Freq: Every day | ORAL | 1 refills | Status: DC

## 2023-12-30 MED ORDER — LORAZEPAM 1 MG PO TABS: 1.0000 mg | ORAL_TABLET | Freq: Three times a day (TID) | ORAL | 0 refills | Status: DC | PRN

## 2023-12-30 MED ORDER — HYDROCORTISONE 10 MG PO TABS: 10.0000 mg | ORAL_TABLET | Freq: Two times a day (BID) | ORAL | 1 refills | Status: DC

## 2023-12-30 NOTE — Progress Notes (Addendum)
 Patient Office Visit  Assessment & Plan:  Hypercholesteremia -     Lipid panel  Anxiety -     LORazepam ; Take 1 tablet (1 mg total) by mouth every 8 (eight) hours as needed for anxiety.  Dispense: 90 tablet; Refill: 0  Adrenal insufficiency -     Hydrocortisone ; Take 1 tablet (10 mg total) by mouth 2 (two) times daily.  Dispense: 60 tablet; Refill: 1  Tobacco abuse  Hyperthyroidism  Paroxysmal atrial fibrillation (HCC) -     CBC with Differential/Platelet -     Comprehensive metabolic panel with GFR  Need for shingles vaccine -     Varicella-zoster vaccine IM  Chronic midline low back pain without sciatica -     LORazepam ; Take 1 tablet (1 mg total) by mouth every 8 (eight) hours as needed for anxiety.  Dispense: 90 tablet; Refill: 0 -     tiZANidine  HCl; Take 1 capsule (4 mg total) by mouth every 6 (six) hours.  Dispense: 120 capsule; Refill: 1 -     DULoxetine  HCl; Take 1 capsule (30 mg total) by mouth daily. Will take with 60mg  Cymbalta - total 90mg /day  Dispense: 90 capsule; Refill: 1   Assessment and Plan    Anxiety disorder Increased anxiety after reducing lorazepam . Prefers lorazepam  over clonazepam . Discussed long-term use risks. - Continue lorazepam  at three tablets daily. - Reassess lorazepam  dosage at next visit.  Adrenocortical insufficiency Symptoms well-controlled with hydrocortisone . - Continue current hydrocortisone  regimen.  Paroxysmal atrial fibrillation Stable on anticoagulation. Cardiology follow-up scheduled for March. - Continue current anticoagulation therapy. - Follow up with cardiology in March.  Hyperthyroidism Managed with methimazole . Awaiting further evaluation for biopsy. - Continue methimazole  once daily. - Follow up with endocrinology as scheduled.  Chronic low back pain with lumbar spondylosis and scoliosis Severe pain affecting daily activities. Cymbalta  effective for pain management. - Increase Cymbalta  to 90 mg  daily.  General health maintenance Discussed shingles vaccine and colonoscopy. Mammograms up to date. - Schedule colonoscopy.      Return in about 4 weeks (around 01/27/2024), or if symptoms worsen or fail to improve.   Subjective:    Patient ID: Debra Graves, female    DOB: 02/12/1955  Age: 68 y.o. MRN: [id number]  Chief Complaint  Patient presents with   Medical Management of Chronic Issues    HPI Discussed the use of AI scribe software for clinical note transcription with the patient, who gave verbal consent to proceed.  History of Present Illness        History of Present Illness ASHAUNTI Graves is a 68 year old female with severe anxiety and adrenal insufficiency who presents with increased anxiety and medication management concerns.  She has experienced increased anxiety and nervousness after reducing her Ativan  (lorazepam ) dosage from four to three tablets per day. She describes feeling 'real nervous and shaky' and crying frequently. She has been on Ativan  for many years and prefers to remain on it rather than switching to Klonopin , which she has not taken despite having a prescription.  She has a history of adrenal insufficiency and has been on hydrocortisone  for a long time, which she states helps her condition. She also takes Cymbalta  (duloxetine ) once daily and believes it helps with her condition.  She is up to date on her mammograms but not on her colonoscopy. She saw her endocrinologist on December 2nd and is awaiting further regulation of her thyroid  levels before a biopsy is considered. She has been on  methimazole  for over a year for hyperthyroidism, which started over a year ago. There is no immediate family history of thyroid  problems.  She has a long-standing history of heart disease and atrial fibrillation, for which she sees Dr. Jayson Sierras. She is on a blood thinner and reports no issues with bleeding or bruising. She can sometimes tell when she goes into  AFib.  She experiences significant lower back pain daily, which she attributes to arthritis, spurs, and scoliosis. She has had MRIs in the past but reports no specific treatment recommendations were given. The pain impacts her ability to perform daily activities.  She quit smoking approximately two years ago. She has a history of shingles and is considering the shingles vaccine. Her kidney function was noted to be slightly off, and she reports drinking plenty of water .  Physical Exam CARDIOVASCULAR: Irregular heart rhythm consistent with atrial fibrillation.  Results RADIOLOGY Lumbar spine MRI: Osteoarthritis, osteophytes, disc herniation, scoliosis  Assessment and Plan Anxiety disorder Increased anxiety after reducing lorazepam . Prefers lorazepam  over clonazepam . Discussed long-term use risks. - Continue lorazepam  at three tablets daily. - Reassess lorazepam  dosage at next visit-hopefully we can reduce it to twice a day  Adrenocortical insufficiency Symptoms well-controlled with hydrocortisone . - Continue current hydrocortisone  regimen.  Paroxysmal atrial fibrillation Stable on anticoagulation. Cardiology follow-up scheduled for March. - Continue current anticoagulation therapy. - Follow up with cardiology in March.  Hyperthyroidism Managed with methimazole . Awaiting further evaluation for biopsy. - Continue methimazole  once daily. - Follow up with endocrinology as scheduled.  Chronic low back pain with lumbar spondylosis and scoliosis Severe pain affecting daily activities. Cymbalta  effective for pain management. - Increase Cymbalta  to 90 mg daily.  General health maintenance Discussed shingles vaccine and colonoscopy. Mammograms up to date. - Schedule colonoscopy.    The ASCVD Risk score (Arnett DK, et al., 2019) failed to calculate for the following reasons:   The valid total cholesterol range is 130 to 320 mg/dL  Past Medical History:  Diagnosis Date   Adrenal  insufficiency    Anxiety    Aortic insufficiency    Arthritis    Back pain    CAD (coronary artery disease)    Moderate ostial RCA disease at cardiac catheterization 2006   Constipation    Depression    Fibroids    Hypercholesteremia    Mild carotid artery disease    Rectocele    Past Surgical History:  Procedure Laterality Date   CARDIAC CATHETERIZATION  03/08/2004   mild progressive CAD   carotid duplex  03/09/2007   rt subclavian 0 to 49%,right bulb 0 to 49 %,lf ICA normal   COLONOSCOPY N/A 06/16/2013   Dr. Shaaron: hyperplastic polyps, melanosis coli    DOPPLER ECHOCARDIOGRAPHY  03/20/2010   EF >55 %,trace mitral regurg,mild tricuspid regurg,aortic valve mildly sclerotic'mild to moderate aortic regurg,trace pulmonic valver regurg   NM MYOCAR PERF WALL MOTION  03/19/06   EF 61%,low risk study   Social History   Tobacco Use   Smoking status: Former    Current packs/day: 0.50    Average packs/day: 0.5 packs/day for 41.0 years (20.5 ttl pk-yrs)    Types: Cigarettes   Smokeless tobacco: Never  Vaping Use   Vaping status: Never Used  Substance Use Topics   Alcohol use: No   Drug use: No   Family History  Problem Relation Age of Onset   Breast cancer Mother    Cancer Mother 64       Breast then bones  and all over   Coronary artery disease Father    Heart disease Father    Stroke Father    Hypertension Sister    Breast cancer Sister 48   Heart disease Sister    Stroke Brother    Coronary artery disease Brother    Coronary artery disease Brother    Coronary artery disease Brother    Colon cancer Neg Hx    Allergies  Allergen Reactions   Albuterol    Bactrim [Sulfamethoxazole-Trimethoprim]    Vytorin [Ezetimibe -Simvastatin]     Myalgias     ROS    Objective:    BP 134/86   Temp 98.4 F (36.9 C)   Ht 5' 6 (1.676 m)   Wt 219 lb (99.3 kg)   BMI 35.35 kg/m  BP Readings from Last 3 Encounters:  12/30/23 134/86  12/22/23 128/74  11/25/23 132/72   Wt  Readings from Last 3 Encounters:  12/30/23 219 lb (99.3 kg)  12/22/23 221 lb 6.4 oz (100.4 kg)  11/25/23 221 lb 8 oz (100.5 kg)    Physical Exam Vitals and nursing note reviewed.  Constitutional:      General: She is not in acute distress.    Appearance: Normal appearance.  HENT:     Head: Normocephalic.     Right Ear: Tympanic membrane, ear canal and external ear normal.     Left Ear: Tympanic membrane, ear canal and external ear normal.  Eyes:     Extraocular Movements: Extraocular movements intact.     Pupils: Pupils are equal, round, and reactive to light.  Cardiovascular:     Rate and Rhythm: Regular rhythm. Tachycardia present.     Heart sounds: Normal heart sounds.  Pulmonary:     Effort: Pulmonary effort is normal.     Breath sounds: Normal breath sounds. No wheezing.  Musculoskeletal:     Right lower leg: No edema.     Left lower leg: No edema.  Neurological:     General: No focal deficit present.     Mental Status: She is alert and oriented to person, place, and time.  Psychiatric:        Attention and Perception: Attention normal.        Mood and Affect: Mood is anxious.        Speech: Speech normal.        Behavior: Behavior normal.        Thought Content: Thought content normal.        Cognition and Memory: Cognition normal.        Judgment: Judgment normal.      Results for orders placed or performed in visit on 12/30/23  CBC with Differential/Platelet  Result Value Ref Range   WBC 9.6 3.8 - 10.8 Thousand/uL   RBC 3.94 3.80 - 5.10 Million/uL   Hemoglobin 11.4 (L) 11.7 - 15.5 g/dL   HCT 64.7 (L) 64.0 - 53.9 %   MCV 89.3 81.4 - 101.7 fL   MCH 28.9 27.0 - 33.0 pg   MCHC 32.4 31.6 - 35.4 g/dL   RDW 87.7 88.9 - 84.9 %   Platelets 320 140 - 400 Thousand/uL   MPV 10.0 7.5 - 12.5 fL   Neutro Abs 4,848 1,500 - 7,800 cells/uL   Absolute Lymphocytes 3,331 850 - 3,900 cells/uL   Absolute Monocytes 1,200 (H) 200 - 950 cells/uL   Eosinophils Absolute 182 15 -  500 cells/uL   Basophils Absolute 38 0 - 200 cells/uL   Neutrophils  Relative % 50.5 %   Total Lymphocyte 34.7 %   Monocytes Relative 12.5 %   Eosinophils Relative 1.9 %   Basophils Relative 0.4 %  Comprehensive metabolic panel with GFR  Result Value Ref Range   Glucose, Bld 132 (H) 65 - 99 mg/dL   BUN 21 7 - 25 mg/dL   Creat 8.97 9.49 - 8.94 mg/dL   eGFR 60 > OR = 60 fO/fpw/8.26f7   BUN/Creatinine Ratio SEE NOTE: 6 - 22 (calc)   Sodium 139 135 - 146 mmol/L   Potassium 4.0 3.5 - 5.3 mmol/L   Chloride 104 98 - 110 mmol/L   CO2 27 20 - 32 mmol/L   Calcium  9.3 8.6 - 10.4 mg/dL   Total Protein 6.1 6.1 - 8.1 g/dL   Albumin 3.6 3.6 - 5.1 g/dL   Globulin 2.5 1.9 - 3.7 g/dL (calc)   AG Ratio 1.4 1.0 - 2.5 (calc)   Total Bilirubin 0.4 0.2 - 1.2 mg/dL   Alkaline phosphatase (APISO) 70 37 - 153 U/L   AST 17 10 - 35 U/L   ALT 14 6 - 29 U/L  Lipid panel  Result Value Ref Range   Cholesterol 94 <200 mg/dL   HDL 49 (L) > OR = 50 mg/dL   Triglycerides 860 <849 mg/dL   LDL Cholesterol (Calc) 23 mg/dL (calc)   Total CHOL/HDL Ratio 1.9 <5.0 (calc)   Non-HDL Cholesterol (Calc) 45 <869 mg/dL (calc)

## 2023-12-30 NOTE — Patient Instructions (Signed)
 SABRA

## 2024-01-01 ENCOUNTER — Ambulatory Visit: Payer: Self-pay | Admitting: Family Medicine

## 2024-01-01 ENCOUNTER — Telehealth: Payer: Self-pay

## 2024-01-01 NOTE — Progress Notes (Signed)
 Lvm for pt to return call

## 2024-01-01 NOTE — Telephone Encounter (Signed)
 Copied from CRM #[id number]. Topic: Clinical - Lab/Test Results >> Jan 01, 2024 12:14 PM Emylou G wrote: Reason for CRM: adv patient of her lab results

## 2024-01-01 NOTE — Telephone Encounter (Signed)
 Pt informed and verbalized understanding

## 2024-01-06 ENCOUNTER — Ambulatory Visit: Admitting: Adult Health

## 2024-01-06 ENCOUNTER — Encounter: Admitting: Adult Health

## 2024-01-06 ENCOUNTER — Encounter: Payer: Self-pay | Admitting: Adult Health

## 2024-01-06 DIAGNOSIS — Z87891 Personal history of nicotine dependence: Secondary | ICD-10-CM | POA: Diagnosis not present

## 2024-01-06 NOTE — Patient Instructions (Signed)

## 2024-01-06 NOTE — Progress Notes (Signed)
°  Virtual Visit via Telephone Note  I connected with Debra Graves , 01/06/2024 9:44 AM by a telemedicine application and verified that I am speaking with the correct person using two identifiers.  Location: Patient: home Provider: home   I discussed the limitations of evaluation and management by telemedicine and the availability of in person appointments. The patient expressed understanding and agreed to proceed.   Shared Decision Making Visit Lung Cancer Screening Program [phone])   Eligibility: 68 y.o. Pack Years Smoking History Calculation = 49 pack years  (# packs/per year x # years smoked) Recent History of coughing up blood  no Unexplained weight loss? no ( >Than 15 pounds within the last 6 months ) Prior History Lung / other cancer no (Diagnosis within the last 5 years already requiring surveillance chest CT Scans). Smoking Status Former Smoker Former Smokers: Years since quit: 2 years  Quit Date: 2023  Visit Components: Discussion included one or more decision making aids. YES Discussion included risk/benefits of screening. YES Discussion included potential follow up diagnostic testing for abnormal scans. YES Discussion included meaning and risk of over diagnosis. YES Discussion included meaning and risk of False Positives. YES Discussion included meaning of total radiation exposure. YES  Counseling Included: Importance of adherence to annual lung cancer LDCT screening. YES Impact of comorbidities on ability to participate in the program. YES Ability and willingness to under diagnostic treatment. YES  Smoking Cessation Counseling: Former Smokers:  Discussed the importance of maintaining cigarette abstinence. yes Diagnosis Code: Personal History of Nicotine Dependence. S12.108 Information about tobacco cessation classes and interventions provided to patient. Yes Patient provided with ticket for LDCT Scan. yes Written Order for Lung Cancer Screening with LDCT  placed in Epic. Yes (CT Chest Lung Cancer Screening Low Dose W/O CM) [id number]   Z12.2-Screening of respiratory organs Z87.891-Personal history of nicotine dependence   Debra Graves 01/06/2024

## 2024-01-12 ENCOUNTER — Other Ambulatory Visit: Payer: Self-pay | Admitting: Gastroenterology

## 2024-01-12 ENCOUNTER — Ambulatory Visit (HOSPITAL_COMMUNITY)
Admission: RE | Admit: 2024-01-12 | Discharge: 2024-01-12 | Disposition: A | Discharge status: Home / Self Care | Source: Ambulatory Visit | Attending: Acute Care | Admitting: Acute Care

## 2024-01-12 DIAGNOSIS — Z87891 Personal history of nicotine dependence: Secondary | ICD-10-CM | POA: Insufficient documentation

## 2024-01-12 DIAGNOSIS — Z122 Encounter for screening for malignant neoplasm of respiratory organs: Secondary | ICD-10-CM | POA: Diagnosis present

## 2024-01-22 ENCOUNTER — Telehealth: Payer: Self-pay

## 2024-01-22 ENCOUNTER — Telehealth: Payer: Self-pay | Admitting: Acute Care

## 2024-01-22 NOTE — Telephone Encounter (Signed)
 Pulmonology is taking care of this. Notes from today show that the impression was routed to Pulmonology NP.

## 2024-01-22 NOTE — Telephone Encounter (Signed)
 Call report received:    IMPRESSION: 1. Lung-RADS 1, negative. Continue annual screening with low-dose chest CT without contrast in 12 months. 2. Moderate T5 vertebral compression fracture, of uncertain chronicity, appearing new since 12/16/2022 lateral chest radiograph, potentially acute or subacute. 3. Large hiatal hernia. 4. Three-vessel coronary atherosclerosis. 5. Aortic Atherosclerosis (ICD10-I70.0) and Emphysema (ICD10-J43.9).   These results will be called to the ordering clinician or representative by the Radiologist Assistant, and communication documented in the PACS or [employer].     Electronically Signed   By: Selinda DELENA Blue M.D.   On: 01/20/2024 14:57

## 2024-01-22 NOTE — Telephone Encounter (Signed)
 Copied from CRM [id number]. Topic: Clinical - Lab/Test Results >> Jan 22, 2024 12:28 PM Joesph NOVAK wrote: Reason for CRM: patient states her pcp ordered a ct scan and she would like to know the results.   I see the ordering provider is her pulmonologist... should she call them? Please fu with patient.

## 2024-01-25 ENCOUNTER — Telehealth: Payer: Self-pay | Admitting: Acute Care

## 2024-01-25 ENCOUNTER — Other Ambulatory Visit: Payer: Self-pay

## 2024-01-25 DIAGNOSIS — Z87891 Personal history of nicotine dependence: Secondary | ICD-10-CM

## 2024-01-25 DIAGNOSIS — Z122 Encounter for screening for malignant neoplasm of respiratory organs: Secondary | ICD-10-CM

## 2024-01-25 NOTE — Telephone Encounter (Signed)
 Spoke with patient. Reviewed recent Lung CT results. She will complete an annual Lung CT again next year. She denies any recent trauma or fall. No recent back injury. States she has had chronic back pain for years and it is hard to say if she has had any new pain. She has no numbness or tingling to her upper extremities. No weakness. Previous patient of Dr. Bertell who has retired. Pt is establishing with [name] Longs on 01/27/2024. Patient is aware that the results will be reviewed by the provider and recommendations will be made. Please advise.   Isaiah, RN

## 2024-01-25 NOTE — Telephone Encounter (Signed)
 LR 1. 12 month follow up scan. Please fax results to PCP.  Please let pt. Know about the compression fracture and have her follow up with PCP. Also, she has a hiatal hernia. Please let her know, and have her follow up with PCP.  Thanks so much

## 2024-01-25 NOTE — Telephone Encounter (Signed)
 Attempted to call pt. Call went straight to voicemail but VM box was full and unable to leave a message.  Called and left VM with pt's DPR, Darina Lever asking him to let pt know to give us  a call.

## 2024-01-25 NOTE — Telephone Encounter (Signed)
 Copied from CRM #[id number]. Topic: Clinical - Lab/Test Results >> Jan 25, 2024  9:20 AM Russell PARAS wrote: Pt is contacting [facility], returning call from [place] concerning her CT results.    Pt requested call back  CB#  [phone]

## 2024-01-25 NOTE — Telephone Encounter (Signed)
 LVM to review recent Lung CT results.

## 2024-01-25 NOTE — Telephone Encounter (Signed)
 Copied from CRM [id number]. Topic: Clinical - Lab/Test Results >> Jan 25, 2024  1:37 PM Debra Graves wrote: Reason for CRM: Patient is requesting a call back to discuss  her LCS CT results.  Patient states she has been waiting since 12/23   E2C2 if patient calls back please have them call [phone] for results.

## 2024-01-26 NOTE — Telephone Encounter (Signed)
 See provider note 01/26/2024 on updated plan.

## 2024-01-26 NOTE — Telephone Encounter (Signed)
 Spoke with patient and reviewed recent Lung CT results with recommendations. She will complete an annual Lung CT again next year. Order placed.   Debra Graves, pt has a new patient appt with you tomorrow. She has requested I send her Lung CT results to your office to discuss needed follow up.   Isaiah, RN

## 2024-01-27 ENCOUNTER — Ambulatory Visit: Payer: Self-pay

## 2024-01-27 VITALS — BP 127/59 | HR 133 | Ht 66.0 in | Wt 163.1 lb

## 2024-01-27 DIAGNOSIS — E0590 Thyrotoxicosis, unspecified without thyrotoxic crisis or storm: Secondary | ICD-10-CM | POA: Diagnosis not present

## 2024-01-27 DIAGNOSIS — Z1211 Encounter for screening for malignant neoplasm of colon: Secondary | ICD-10-CM | POA: Diagnosis not present

## 2024-01-27 DIAGNOSIS — F419 Anxiety disorder, unspecified: Secondary | ICD-10-CM | POA: Diagnosis not present

## 2024-01-27 DIAGNOSIS — K219 Gastro-esophageal reflux disease without esophagitis: Secondary | ICD-10-CM

## 2024-01-27 DIAGNOSIS — M545 Low back pain, unspecified: Secondary | ICD-10-CM

## 2024-01-27 DIAGNOSIS — M4850XA Collapsed vertebra, not elsewhere classified, site unspecified, initial encounter for fracture: Secondary | ICD-10-CM | POA: Insufficient documentation

## 2024-01-27 DIAGNOSIS — Z13820 Encounter for screening for osteoporosis: Secondary | ICD-10-CM

## 2024-01-27 DIAGNOSIS — G8929 Other chronic pain: Secondary | ICD-10-CM

## 2024-01-27 DIAGNOSIS — M4850XD Collapsed vertebra, not elsewhere classified, site unspecified, subsequent encounter for fracture with routine healing: Secondary | ICD-10-CM | POA: Diagnosis not present

## 2024-01-27 MED ORDER — LORAZEPAM 1 MG PO TABS: 1.0000 mg | ORAL_TABLET | Freq: Three times a day (TID) | ORAL | 0 refills | Status: DC | PRN

## 2024-01-27 MED ORDER — CLONAZEPAM 0.5 MG PO TABS: 0.5000 mg | ORAL_TABLET | Freq: Every day | ORAL | 0 refills | Status: DC

## 2024-01-27 NOTE — Progress Notes (Signed)
 "  New Patient Office Visit  Subjective    Patient ID: Debra Graves, female    DOB: [birth year]  Age: 69 y.o. MRN: [id number]  CC:  Chief Complaint  Patient presents with   Establish Care    Pt here to establish care,     HPI Debra Graves presents to establish care  Discussed the use of AI scribe software for clinical note transcription with the patient, who gave verbal consent to proceed.  History of Present Illness    Debra Graves is a 69 year old female who presents for medication management and evaluation of her back pain.  Back pain and vertebral compression fracture - Persistent back pain with history of T5 vertebral compression fracture, incidentally found on lung cancer screening CT scan - No recent falls - Arthritis contributes to discomfort - Living situation may exacerbate symptoms - No bone density exam since 2007 - Cymbalta  60 mg daily for depression and back pain, despite recommendation to increase to 90 mg - No use of vitamins or calcium  due to thyroid  condition  Thyroid  dysfunction - Hyperthyroidism managed by endocrinologist - On medication but continues to feel unwell and perceives thyroid  function as 'still not right'  Anxiety and panic attacks - History of panic attacks - Previously on four lactic palmitate daily, reduced to one mg - Prescribed 0.5 mg Klonopin  once in the morning for slow release - Lorazepam  adjusted to every eight hours, resulting in increased nervousness, crying, and shaking - Resumed Klonopin  use - Currently taking three lorazepam  and Klonopin   Depression and weight loss - On Cymbalta  for depression - Significant weight loss from 220 lbs to 164 lbs  Gastrointestinal symptoms - Indigestion managed with Nexium  - Occasional use of Tums or Pepcid for symptom relief  Pulmonary and smoking history - History of smoking, now quit - Lung cancer screening normal  Adrenal and hepatic evaluation - Previous evaluation of adrenal  gland and liver - Advised to return in six months - No new medication initiated at that time     Outpatient Encounter Medications as of 01/27/2024  Medication Sig   Docusate Calcium  (STOOL SOFTENER PO) Take 2 tablets by mouth daily.   DULoxetine  (CYMBALTA ) 60 MG capsule Take 60 mg by mouth daily.   ELIQUIS  5 MG TABS tablet Take 1 tablet (5 mg total) by mouth 2 (two) times daily.   esomeprazole  (NEXIUM ) 20 MG capsule TAKE ONE CAPSULE BY MOUTH TWICE DAILY BEFORE A MEAL   ezetimibe  (ZETIA ) 10 MG tablet Take 1 tablet (10 mg total) by mouth daily.   fish oil-omega-3 fatty acids 1000 MG capsule Take 2 g by mouth daily.   furosemide  (LASIX ) 20 MG tablet Take 1 tablet (20 mg total) by mouth 2 (two) times daily.   hydrocortisone  (CORTEF ) 10 MG tablet Take 1 tablet (10 mg total) by mouth 2 (two) times daily.   MAGNESIUM  PO Take by mouth daily.   meloxicam (MOBIC) 7.5 MG tablet Take 7.5 mg by mouth 2 (two) times daily.   methimazole  (TAPAZOLE ) 5 MG tablet Take 1 tablet (5 mg total) by mouth daily.   metoprolol  succinate (TOPROL -XL) 100 MG 24 hr tablet Take 1 tablet (100 mg total) by mouth daily. Take with or immediately following a meal.   potassium chloride  SA (KLOR-CON  M) 20 MEQ tablet Take 20 mEq by mouth daily.   rosuvastatin  (CRESTOR ) 40 MG tablet Take 1 tablet (40 mg total) by mouth daily.   tiZANidine  (ZANAFLEX ) 4 MG tablet  Take 4 mg by mouth every 6 (six) hours.   Vitamin D, Ergocalciferol, 2000 units CAPS Take 1 capsule by mouth daily.   [DISCONTINUED] clonazePAM  (KLONOPIN ) 0.5 MG tablet Take 0.5 mg by mouth daily.   [DISCONTINUED] DULoxetine  (CYMBALTA ) 30 MG capsule Take 1 capsule (30 mg total) by mouth daily. Will take with 60mg  Cymbalta - total 90mg /day   [DISCONTINUED] DULoxetine  (CYMBALTA ) 60 MG capsule Take 1 capsule (60 mg total) by mouth daily. (Patient taking differently: Take 30 mg by mouth daily.)   [DISCONTINUED] LORazepam  (ATIVAN ) 1 MG tablet Take 1 tablet (1 mg total) by mouth  every 8 (eight) hours as needed for anxiety.   [DISCONTINUED] tiZANidine  (ZANAFLEX ) 4 MG capsule Take 1 capsule (4 mg total) by mouth every 6 (six) hours.   clonazePAM  (KLONOPIN ) 0.5 MG tablet Take 1 tablet (0.5 mg total) by mouth daily.   LORazepam  (ATIVAN ) 1 MG tablet Take 1 tablet (1 mg total) by mouth every 8 (eight) hours as needed for anxiety.   [DISCONTINUED] traMADol (ULTRAM) 50 MG tablet Take 50 mg by mouth every 6 (six) hours. (Patient not taking: Reported on 01/27/2024)   No facility-administered encounter medications on file as of 01/27/2024.    Past Medical History:  Diagnosis Date   Adrenal insufficiency    Anxiety    Aortic insufficiency    Arthritis    Back pain    CAD (coronary artery disease)    Moderate ostial RCA disease at cardiac catheterization 2006   Constipation    Depression    Fibroids    Hypercholesteremia    Mild carotid artery disease    Rectocele     Past Surgical History:  Procedure Laterality Date   CARDIAC CATHETERIZATION  03/08/2004   mild progressive CAD   carotid duplex  03/09/2007   rt subclavian 0 to 49%,right bulb 0 to 49 %,lf ICA normal   COLONOSCOPY N/A 06/16/2013   Dr. Shaaron: hyperplastic polyps, melanosis coli    DOPPLER ECHOCARDIOGRAPHY  03/20/2010   EF >55 %,trace mitral regurg,mild tricuspid regurg,aortic valve mildly sclerotic'mild to moderate aortic regurg,trace pulmonic valver regurg   NM MYOCAR PERF WALL MOTION  03/19/06   EF 61%,low risk study    Family History  Problem Relation Age of Onset   Breast cancer Mother    Cancer Mother 40       Breast then bones and all over   Coronary artery disease Father    Heart disease Father    Stroke Father    Hypertension Sister    Breast cancer Sister 54   Heart disease Sister    Stroke Brother    Coronary artery disease Brother    Coronary artery disease Brother    Coronary artery disease Brother    Colon cancer Neg Hx     Social History   Socioeconomic History   Marital  status: Divorced    Spouse name: Not on file   Number of children: Not on file   Years of education: Not on file   Highest education level: Not on file  Occupational History   Occupation: disability    Employer: UNEMPLOYED  Tobacco Use   Smoking status: Former    Current packs/day: 0.50    Average packs/day: 0.5 packs/day for 41.0 years (20.5 ttl pk-yrs)    Types: Cigarettes   Smokeless tobacco: Never  Vaping Use   Vaping status: Never Used  Substance and Sexual Activity   Alcohol use: No   Drug use: No  Sexual activity: Not Currently    Birth control/protection: None, Post-menopausal    Comment: once in 9 months  Other Topics Concern   Not on file  Social History Narrative   Drinks 3 cups of coffee daily.   Social Drivers of Health   Tobacco Use: Medium Risk (01/27/2024)   Patient History    Smoking Tobacco Use: Former    Smokeless Tobacco Use: Never    Passive Exposure: Not on Actuary Strain: Not on file  Food Insecurity: Not on file  Transportation Needs: Not on file  Physical Activity: Not on file  Stress: Not on file  Social Connections: Not on file  Intimate Partner Violence: Not on file  Depression (PHQ2-9): Low Risk (01/27/2024)   Depression (PHQ2-9)    PHQ-2 Score: 4  Alcohol Screen: Not on file  Housing: Not on file  Utilities: Not on file  Health Literacy: Not on file   ROS     Objective    BP (!) 127/59   Pulse (!) 133   Ht 5' 6 (1.676 m)   Wt 163 lb 1.9 oz (74 kg)   SpO2 93%   BMI 26.33 kg/m   Physical Exam Vitals and nursing note reviewed. Exam conducted with a chaperone present (niece Hadassah).  Constitutional:      Appearance: Normal appearance. She is obese.  HENT:     Head: Normocephalic.     Right Ear: Tympanic membrane, ear canal and external ear normal.     Left Ear: Tympanic membrane, ear canal and external ear normal.     Nose: Nose normal.     Mouth/Throat:     Mouth: Mucous membranes are moist.      Pharynx: Oropharynx is clear.  Eyes:     Extraocular Movements: Extraocular movements intact.     Conjunctiva/sclera: Conjunctivae normal.     Pupils: Pupils are equal, round, and reactive to light.  Cardiovascular:     Rate and Rhythm: Normal rate and regular rhythm.  Pulmonary:     Effort: Pulmonary effort is normal.     Breath sounds: Normal breath sounds.  Abdominal:     General: Bowel sounds are normal.     Palpations: Abdomen is soft.  Musculoskeletal:     Cervical back: Normal range of motion and neck supple.     Thoracic back: Tenderness present. Decreased range of motion.  Skin:    General: Skin is warm and dry.  Neurological:     Mental Status: She is alert and oriented to person, place, and time.  Psychiatric:        Mood and Affect: Mood normal.        Thought Content: Thought content normal.         Assessment & Plan:   Problem List Items Addressed This Visit       Digestive   GERD (gastroesophageal reflux disease)   Managed with Nexium . Reports indigestion and inquires about additional treatments. - Continue Nexium . - Consider Tums or Pepcid for additional relief.        Endocrine   Hyperthyroidism   Managed by Endo.   - Continue current thyroid  management.        Musculoskeletal and Integument   Non-traumatic compression fracture of vertebral column (HCC) - Primary (Chronic)   T5 fracture likely due to osteoporosis, age, and smoking history. - Ordered bone density exam.         Other   Anxiety   Managed with lorazepam   and clonazepam . Recent adjustments increased anxiety and panic attacks. - Continue lorazepam  3 times daily. - Continue clonazepam  as needed.      Relevant Medications   DULoxetine  (CYMBALTA ) 60 MG capsule   clonazePAM  (KLONOPIN ) 0.5 MG tablet   LORazepam  (ATIVAN ) 1 MG tablet   Chronic midline low back pain without sciatica   Chronic low back pain Possibly exacerbated by vertebral compression fracture and osteoporosis.  Managed with Cymbalta  60 mg. - Continue Cymbalta  60 mg.      Relevant Medications   tiZANidine  (ZANAFLEX ) 4 MG tablet   DULoxetine  (CYMBALTA ) 60 MG capsule   clonazePAM  (KLONOPIN ) 0.5 MG tablet   Other Visit Diagnoses       Osteoporosis screening       Relevant Orders   DG Bone Density     Screening for colon cancer       Relevant Orders   Cologuard      Return in about 4 months (around 05/26/2024) for chronic follow-up with PCP.   [name] Longs, FNP   "

## 2024-01-29 DIAGNOSIS — G8929 Other chronic pain: Secondary | ICD-10-CM | POA: Insufficient documentation

## 2024-01-29 DIAGNOSIS — E0590 Thyrotoxicosis, unspecified without thyrotoxic crisis or storm: Secondary | ICD-10-CM | POA: Insufficient documentation

## 2024-01-29 NOTE — Assessment & Plan Note (Signed)
 Chronic low back pain Possibly exacerbated by vertebral compression fracture and osteoporosis. Managed with Cymbalta  60 mg. - Continue Cymbalta  60 mg.

## 2024-01-29 NOTE — Assessment & Plan Note (Signed)
 T5 fracture likely due to osteoporosis, age, and smoking history. - Ordered bone density exam.

## 2024-01-29 NOTE — Assessment & Plan Note (Addendum)
 Managed by Endo.   - Continue current thyroid  management.

## 2024-01-29 NOTE — Assessment & Plan Note (Signed)
 Managed with lorazepam  and clonazepam . Recent adjustments increased anxiety and panic attacks. - Continue lorazepam  3 times daily. - Continue clonazepam  as needed.

## 2024-01-29 NOTE — Assessment & Plan Note (Signed)
 Managed with Nexium . Reports indigestion and inquires about additional treatments. - Continue Nexium . - Consider Tums or Pepcid for additional relief.

## 2024-02-01 ENCOUNTER — Other Ambulatory Visit (HOSPITAL_COMMUNITY)

## 2024-02-05 ENCOUNTER — Other Ambulatory Visit (HOSPITAL_COMMUNITY)

## 2024-02-09 ENCOUNTER — Encounter (HOSPITAL_COMMUNITY): Payer: Self-pay | Admitting: Emergency Medicine

## 2024-02-09 ENCOUNTER — Inpatient Hospital Stay (HOSPITAL_COMMUNITY): Admission: EM | Admit: 2024-02-09 | Discharge: 2024-02-21 | DRG: 643 | Disposition: E

## 2024-02-09 ENCOUNTER — Other Ambulatory Visit: Payer: Self-pay

## 2024-02-09 ENCOUNTER — Emergency Department (HOSPITAL_COMMUNITY)

## 2024-02-09 DIAGNOSIS — G9341 Metabolic encephalopathy: Secondary | ICD-10-CM

## 2024-02-09 DIAGNOSIS — N179 Acute kidney failure, unspecified: Secondary | ICD-10-CM

## 2024-02-09 DIAGNOSIS — I4891 Unspecified atrial fibrillation: Principal | ICD-10-CM | POA: Diagnosis present

## 2024-02-09 DIAGNOSIS — I469 Cardiac arrest, cause unspecified: Secondary | ICD-10-CM

## 2024-02-09 DIAGNOSIS — E0590 Thyrotoxicosis, unspecified without thyrotoxic crisis or storm: Secondary | ICD-10-CM

## 2024-02-09 DIAGNOSIS — W19XXXA Unspecified fall, initial encounter: Secondary | ICD-10-CM

## 2024-02-09 DIAGNOSIS — E0581 Other thyrotoxicosis with thyrotoxic crisis or storm: Secondary | ICD-10-CM

## 2024-02-09 DIAGNOSIS — Y92009 Unspecified place in unspecified non-institutional (private) residence as the place of occurrence of the external cause: Secondary | ICD-10-CM

## 2024-02-09 LAB — COMPREHENSIVE METABOLIC PANEL WITH GFR
ALT: 30 U/L (ref 0–44)
AST: 59 U/L — ABNORMAL HIGH (ref 15–41)
Albumin: 3.2 g/dL — ABNORMAL LOW (ref 3.5–5.0)
Alkaline Phosphatase: 74 U/L (ref 38–126)
Anion gap: 16 — ABNORMAL HIGH (ref 5–15)
BUN: 33 mg/dL — ABNORMAL HIGH (ref 8–23)
CO2: 21 mmol/L — ABNORMAL LOW (ref 22–32)
Calcium: 10.9 mg/dL — ABNORMAL HIGH (ref 8.9–10.3)
Chloride: 102 mmol/L (ref 98–111)
Creatinine, Ser: 0.99 mg/dL (ref 0.44–1.00)
GFR, Estimated: >60 mL/min
Glucose, Bld: 110 mg/dL — ABNORMAL HIGH (ref 70–99)
Potassium: 3.8 mmol/L (ref 3.5–5.1)
Sodium: 139 mmol/L (ref 135–145)
Total Bilirubin: 0.8 mg/dL (ref 0.0–1.2)
Total Protein: 6 g/dL — ABNORMAL LOW (ref 6.5–8.1)

## 2024-02-09 LAB — CK: Total CK: 72 U/L (ref 38–234)

## 2024-02-09 LAB — TROPONIN T, HIGH SENSITIVITY: Troponin T High Sensitivity: 34 ng/L — ABNORMAL HIGH (ref 0–19)

## 2024-02-09 LAB — CBC WITH DIFFERENTIAL/PLATELET
Abs Immature Granulocytes: 0.05 K/uL (ref 0.00–0.07)
Basophils Absolute: 0 K/uL (ref 0.0–0.1)
Basophils Relative: 0 %
Eosinophils Absolute: 0 K/uL (ref 0.0–0.5)
Eosinophils Relative: 0 %
HCT: 33.3 % — ABNORMAL LOW (ref 36.0–46.0)
Hemoglobin: 10.3 g/dL — ABNORMAL LOW (ref 12.0–15.0)
Immature Granulocytes: 0 %
Lymphocytes Relative: 18 %
Lymphs Abs: 2.2 K/uL (ref 0.7–4.0)
MCH: 27.1 pg (ref 26.0–34.0)
MCHC: 30.9 g/dL (ref 30.0–36.0)
MCV: 87.6 fL (ref 80.0–100.0)
Monocytes Absolute: 1.5 K/uL — ABNORMAL HIGH (ref 0.1–1.0)
Monocytes Relative: 12 %
Neutro Abs: 8.5 K/uL — ABNORMAL HIGH (ref 1.7–7.7)
Neutrophils Relative %: 70 %
Platelets: 321 K/uL (ref 150–400)
RBC: 3.8 MIL/uL — ABNORMAL LOW (ref 3.87–5.11)
RDW: 14.5 % (ref 11.5–15.5)
WBC: 12.3 K/uL — ABNORMAL HIGH (ref 4.0–10.5)
nRBC: 0 % (ref 0.0–0.2)

## 2024-02-09 LAB — TSH: TSH: <0.1 u[IU]/mL — ABNORMAL LOW (ref 0.350–4.500)

## 2024-02-09 LAB — CBG MONITORING, ED: Glucose-Capillary: 108 mg/dL — ABNORMAL HIGH (ref 70–99)

## 2024-02-09 LAB — MAGNESIUM: Magnesium: 1.8 mg/dL (ref 1.7–2.4)

## 2024-02-09 MED ORDER — HYDROCORTISONE SOD SUC (PF) 500 MG IJ SOLR
300.0000 mg | Freq: Once | INTRAMUSCULAR | Status: AC
  Administered 2024-02-10: 300 mg via INTRAVENOUS
  Filled 2024-02-09: qty 2.4

## 2024-02-09 MED ORDER — MELATONIN 3 MG PO TABS
6.0000 mg | ORAL_TABLET | Freq: Every evening | ORAL | Status: DC | PRN
  Administered 2024-02-10 – 2024-02-12 (×4): 6 mg via ORAL
  Filled 2024-02-09 (×4): qty 2

## 2024-02-09 MED ORDER — POLYETHYLENE GLYCOL 3350 17 G PO PACK: 17.0000 g | PACK | Freq: Every day | ORAL | Status: DC | PRN

## 2024-02-09 MED ORDER — HYDROCORTISONE 10 MG PO TABS
10.0000 mg | ORAL_TABLET | Freq: Two times a day (BID) | ORAL | Status: DC
  Administered 2024-02-10: 10 mg via ORAL
  Filled 2024-02-09 (×2): qty 1

## 2024-02-09 MED ORDER — LORAZEPAM 2 MG/ML IJ SOLN
0.5000 mg | Freq: Once | INTRAMUSCULAR | Status: AC
  Administered 2024-02-09: 0.5 mg via INTRAVENOUS
  Filled 2024-02-09: qty 1

## 2024-02-09 MED ORDER — DILTIAZEM HCL-DEXTROSE 125-5 MG/125ML-% IV SOLN (PREMIX)
5.0000 mg/h | INTRAVENOUS | Status: DC
  Administered 2024-02-09 – 2024-02-10 (×2): 5 mg/h via INTRAVENOUS
  Administered 2024-02-10 – 2024-02-12 (×3): 15 mg/h via INTRAVENOUS
  Administered 2024-02-12: 10 mg/h via INTRAVENOUS
  Filled 2024-02-09 (×5): qty 125

## 2024-02-09 MED ORDER — DULOXETINE HCL 60 MG PO CPEP
60.0000 mg | ORAL_CAPSULE | Freq: Every day | ORAL | Status: DC
  Administered 2024-02-11 – 2024-02-12 (×2): 60 mg via ORAL
  Filled 2024-02-09 (×3): qty 1

## 2024-02-09 MED ORDER — CHLORHEXIDINE GLUCONATE CLOTH 2 % EX PADS
6.0000 | MEDICATED_PAD | Freq: Every day | CUTANEOUS | Status: AC
  Administered 2024-02-10 – 2024-02-15 (×6): 6 via TOPICAL

## 2024-02-09 MED ORDER — PROCHLORPERAZINE EDISYLATE 10 MG/2ML IJ SOLN
5.0000 mg | Freq: Four times a day (QID) | INTRAMUSCULAR | Status: AC | PRN
  Administered 2024-02-10: 5 mg via INTRAVENOUS
  Filled 2024-02-09: qty 2

## 2024-02-09 MED ORDER — HALOPERIDOL LACTATE 5 MG/ML IJ SOLN
1.0000 mg | INTRAMUSCULAR | Status: AC
  Administered 2024-02-09: 1 mg via INTRAVENOUS
  Filled 2024-02-09: qty 1

## 2024-02-09 MED ORDER — LORAZEPAM 1 MG PO TABS
1.0000 mg | ORAL_TABLET | Freq: Three times a day (TID) | ORAL | Status: DC | PRN
  Administered 2024-02-10: 1 mg via ORAL
  Filled 2024-02-09: qty 1

## 2024-02-09 MED ORDER — SODIUM CHLORIDE 0.9 % IV SOLN: INTRAVENOUS | Status: DC

## 2024-02-09 MED ORDER — ROSUVASTATIN CALCIUM 20 MG PO TABS
40.0000 mg | ORAL_TABLET | Freq: Every day | ORAL | Status: DC
  Administered 2024-02-10 – 2024-02-13 (×4): 40 mg via ORAL
  Filled 2024-02-09 (×4): qty 2

## 2024-02-09 MED ORDER — METHIMAZOLE 5 MG PO TABS: 5.0000 mg | ORAL_TABLET | Freq: Every day | ORAL | Status: DC

## 2024-02-09 MED ORDER — EZETIMIBE 10 MG PO TABS
10.0000 mg | ORAL_TABLET | Freq: Every day | ORAL | Status: DC
  Administered 2024-02-11 – 2024-02-12 (×2): 10 mg via ORAL
  Filled 2024-02-09 (×3): qty 1

## 2024-02-09 MED ORDER — ACETAMINOPHEN 325 MG PO TABS
650.0000 mg | ORAL_TABLET | Freq: Once | ORAL | Status: AC
  Administered 2024-02-09: 650 mg via ORAL
  Filled 2024-02-09: qty 2

## 2024-02-09 MED ORDER — DILTIAZEM LOAD VIA INFUSION
20.0000 mg | Freq: Once | INTRAVENOUS | Status: AC
  Administered 2024-02-09: 20 mg via INTRAVENOUS
  Filled 2024-02-09: qty 20

## 2024-02-09 MED ORDER — APIXABAN 5 MG PO TABS
5.0000 mg | ORAL_TABLET | Freq: Once | ORAL | Status: AC
  Administered 2024-02-09: 5 mg via ORAL
  Filled 2024-02-09: qty 1

## 2024-02-09 MED ORDER — PROPRANOLOL HCL 20 MG PO TABS
10.0000 mg | ORAL_TABLET | Freq: Two times a day (BID) | ORAL | Status: DC
  Administered 2024-02-09: 10 mg via ORAL
  Filled 2024-02-09: qty 1

## 2024-02-09 MED ORDER — ACETAMINOPHEN 325 MG PO TABS
650.0000 mg | ORAL_TABLET | Freq: Four times a day (QID) | ORAL | Status: DC | PRN
  Administered 2024-02-10: 650 mg via ORAL
  Filled 2024-02-09: qty 2

## 2024-02-09 MED ORDER — SODIUM CHLORIDE 0.9 % IV BOLUS
500.0000 mL | Freq: Once | INTRAVENOUS | Status: AC
  Administered 2024-02-09: 500 mL via INTRAVENOUS

## 2024-02-09 MED ORDER — METOPROLOL TARTRATE 5 MG/5ML IV SOLN
5.0000 mg | Freq: Once | INTRAVENOUS | Status: AC
  Administered 2024-02-09: 5 mg via INTRAVENOUS
  Filled 2024-02-09: qty 5

## 2024-02-09 NOTE — ED Triage Notes (Signed)
 Pt bib [facility] for multiple falls and dizziness. Per ems pt is in afib and is having a hard time with thought process. Pt states that has been going on for a while.

## 2024-02-09 NOTE — ED Notes (Signed)
 Pt asked to go to [facility], pt taken in wheelchair with NT x 2, refuses to get out of wheelchair and states she is going home, pt yelling at staff and family that she is not staying here and is leaving, unable to redirect pt, pt taken back to room where she refuses to get back in bed, hospitalist paged

## 2024-02-09 NOTE — H&P (Addendum)
 " History and Physical  Debra Graves FMW:[id number] DOB: [birth year] DOA: 02/09/2024  Referring physician: Dr. Dean, EDP  PCP: Bevely Doffing, FNP  Outpatient Specialists: Endocrinology, pulmonary Patient coming from: Home.  Chief Complaint: Multiple falls.  HPI: Debra Graves is a 69 y.o. female with medical history significant for paroxysmal A-fib on Eliquis , hypothyroidism on methimazole , hypertension, hyperlipidemia, coronary artery disease, chronic anxiety disorder, who presents to the ER due to multiple falls at home today.  Associated with altered mental status for the past 2 days and worsening today.  It is unclear whether or not the patient has been compliant with her home medications.  No reported subjective fevers or chills.  In the ER, tachycardic, in A-fib with RVR, rates in the 150s to 180s, tachypneic 28, and confused.  She was started on Cardizem  drip.  Lab studies notable for severely low, nearly undetectable TSH.  Free T4 is pending.  TRH, hospitalist service, was asked to admit.  ED Course: Temperature 97.6.  BP 129/59, pulse 126, respiratory rate 28, O2 sat 95% on room air.  Review of Systems: Review of systems as noted in the HPI. All other systems reviewed and are negative.   Past Medical History:  Diagnosis Date   Adrenal insufficiency    Anxiety    Aortic insufficiency    Arthritis    Back pain    CAD (coronary artery disease)    Moderate ostial RCA disease at cardiac catheterization 2006   Constipation    Depression    Fibroids    Hypercholesteremia    Mild carotid artery disease    Rectocele    Past Surgical History:  Procedure Laterality Date   CARDIAC CATHETERIZATION  03/08/2004   mild progressive CAD   carotid duplex  03/09/2007   rt subclavian 0 to 49%,right bulb 0 to 49 %,lf ICA normal   COLONOSCOPY N/A 06/16/2013   Dr. Shaaron: hyperplastic polyps, melanosis coli    DOPPLER ECHOCARDIOGRAPHY  03/20/2010   EF >55 %,trace mitral regurg,mild  tricuspid regurg,aortic valve mildly sclerotic'mild to moderate aortic regurg,trace pulmonic valver regurg   NM MYOCAR PERF WALL MOTION  03/19/06   EF 61%,low risk study    Social History:  reports that she has quit smoking. Her smoking use included cigarettes. She has a 20.5 pack-year smoking history. She has never used smokeless tobacco. She reports that she does not drink alcohol and does not use drugs.   Allergies[1]  Family History  Problem Relation Age of Onset   Breast cancer Mother    Cancer Mother 73       Breast then bones and all over   Coronary artery disease Father    Heart disease Father    Stroke Father    Hypertension Sister    Breast cancer Sister 92   Heart disease Sister    Stroke Brother    Coronary artery disease Brother    Coronary artery disease Brother    Coronary artery disease Brother    Colon cancer Neg Hx       Prior to Admission medications  Medication Sig Start Date End Date Taking? Authorizing Provider  clonazePAM  (KLONOPIN ) 0.5 MG tablet Take 1 tablet (0.5 mg total) by mouth daily. 01/27/24   Bevely Doffing, FNP  Docusate Calcium  (STOOL SOFTENER PO) Take 2 tablets by mouth daily.    [provider]  DULoxetine  (CYMBALTA ) 60 MG capsule Take 60 mg by mouth daily.    [provider]  ELIQUIS  5 MG  TABS tablet Take 1 tablet (5 mg total) by mouth 2 (two) times daily. 10/26/23   Debera Jayson MATSU, MD  esomeprazole  (NEXIUM ) 20 MG capsule TAKE ONE CAPSULE BY MOUTH TWICE DAILY BEFORE A MEAL 01/12/24   Ezzard Sonny RAMAN, PA-C  ezetimibe  (ZETIA ) 10 MG tablet Take 1 tablet (10 mg total) by mouth daily. 09/28/23 01/27/24  Debera Jayson MATSU, MD  fish oil-omega-3 fatty acids 1000 MG capsule Take 2 g by mouth daily.    [provider]  furosemide  (LASIX ) 20 MG tablet Take 1 tablet (20 mg total) by mouth 2 (two) times daily. 09/28/23   Debera Jayson MATSU, MD  hydrocortisone  (CORTEF ) 10 MG tablet Take 1 tablet (10 mg total) by mouth 2 (two) times  daily. 12/30/23   Aletha Bene, MD  LORazepam  (ATIVAN ) 1 MG tablet Take 1 tablet (1 mg total) by mouth every 8 (eight) hours as needed for anxiety. 01/27/24   Bevely Doffing, FNP  MAGNESIUM  PO Take by mouth daily.    [provider]  meloxicam (MOBIC) 7.5 MG tablet Take 7.5 mg by mouth 2 (two) times daily. 11/23/18   [provider]  methimazole  (TAPAZOLE ) 5 MG tablet Take 1 tablet (5 mg total) by mouth daily. 12/22/23   Therisa Benton PARAS, NP  metoprolol  succinate (TOPROL -XL) 100 MG 24 hr tablet Take 1 tablet (100 mg total) by mouth daily. Take with or immediately following a meal. 09/28/23 01/27/24  Debera Jayson MATSU, MD  potassium chloride  SA (KLOR-CON  M) 20 MEQ tablet Take 20 mEq by mouth daily. 03/25/23   [provider]  rosuvastatin  (CRESTOR ) 40 MG tablet Take 1 tablet (40 mg total) by mouth daily. 10/23/23   Debera Jayson MATSU, MD  tiZANidine  (ZANAFLEX ) 4 MG tablet Take 4 mg by mouth every 6 (six) hours. 12/30/23   [provider]  Vitamin D, Ergocalciferol, 2000 units CAPS Take 1 capsule by mouth daily.    [provider]    Physical Exam: BP (!) 145/61   Pulse (!) 117   Temp 97.6 F (36.4 C) (Oral)   Resp (!) 24   Ht 5' 6 (1.676 m)   Wt 74 kg   SpO2 92%   BMI 26.33 kg/m   General: 69 y.o. year-old female well developed well nourished in no acute distress.  Alert and confused. Cardiovascular: Irregular rate and rhythm with no rubs or gallops.    No lower extremity edema bilaterally. Respiratory: Clear to auscultation with no wheezes or rales. Good inspiratory effort. Abdomen: Soft nontender nondistended with normal bowel sounds x4 quadrants. Muskuloskeletal: No cyanosis, clubbing or edema noted bilaterally Neuro: CN II-XII intact, strength, sensation, reflexes Skin: No ulcerative lesions noted or rashes Psychiatry: Judgement and insight appear altered. Mood is anxious.          Labs on Admission:  Basic Metabolic Panel: Recent Labs   Lab 02/09/24 2046  NA 139  K 3.8  CL 102  CO2 21*  GLUCOSE 110*  BUN 33*  CREATININE 0.99  CALCIUM  10.9*  MG 1.8   Liver Function Tests: Recent Labs  Lab 02/09/24 2046  AST 59*  ALT 30  ALKPHOS 74  BILITOT 0.8  PROT 6.0*  ALBUMIN 3.2*   No results for input(s): LIPASE, AMYLASE in the last 168 hours. No results for input(s): AMMONIA in the last 168 hours. CBC: Recent Labs  Lab 02/09/24 2046  WBC 12.3*  NEUTROABS 8.5*  HGB 10.3*  HCT 33.3*  MCV 87.6  PLT 321  Cardiac Enzymes: Recent Labs  Lab 02/09/24 2046  CKTOTAL 72    BNP (last 3 results) Recent Labs    03/20/23 0406  BNP 399.0*    ProBNP (last 3 results) No results for input(s): PROBNP in the last 8760 hours.  CBG: Recent Labs  Lab 02/09/24 2056  GLUCAP 108*    Radiological Exams on Admission: CT Cervical Spine Wo Contrast Result Date: 02/09/2024 EXAM: CT CERVICAL SPINE WITHOUT CONTRAST 02/09/2024 08:47:36 PM TECHNIQUE: CT of the cervical spine was performed without the administration of intravenous contrast. Multiplanar reformatted images are provided for review. Automated exposure control, iterative reconstruction, and/or weight based adjustment of the mA/kV was utilized to reduce the radiation dose to as low as reasonably achievable. COMPARISON: None available. CLINICAL HISTORY: Polytrauma, blunt FINDINGS: BONES AND ALIGNMENT: No acute fracture or traumatic malalignment. DEGENERATIVE CHANGES: No significant degenerative changes. SOFT TISSUES: No prevertebral soft tissue swelling. IMPRESSION: 1. No significant abnormality. Electronically signed by: Pinkie Pebbles MD 02/09/2024 08:51 PM EST RP Workstation: [id number]   CT Head Wo Contrast Result Date: 02/09/2024 EXAM: CT HEAD WITHOUT CONTRAST 02/09/2024 08:47:36 PM TECHNIQUE: CT of the head was performed without the administration of intravenous contrast. Automated exposure control, iterative reconstruction, and/or weight based  adjustment of the mA/kV was utilized to reduce the radiation dose to as low as reasonably achievable. COMPARISON: None available. CLINICAL HISTORY: Polytrauma, blunt. FINDINGS: BRAIN AND VENTRICLES: No acute hemorrhage. No evidence of acute infarct. No hydrocephalus. No extra-axial collection. No mass effect or midline shift. Bilateral basal ganglia mineralization. ORBITS: No acute abnormality. SINUSES: No acute abnormality. SOFT TISSUES AND SKULL: No acute soft tissue abnormality. No skull fracture. VASCULATURE: Atherosclerotic calcifications of carotid siphons and vertebral arteries. IMPRESSION: 1. No acute intracranial abnormality. Electronically signed by: Pinkie Pebbles MD 02/09/2024 08:50 PM EST RP Workstation: [id number]   DG Chest Portable 1 View Result Date: 02/09/2024 EXAM: 1 VIEW XRAY OF THE CHEST 02/09/2024 08:38:55 PM COMPARISON: 03/20/2023 CLINICAL HISTORY: Fall FINDINGS: LUNGS AND PLEURA: No focal pulmonary opacity. No pleural effusion. No pneumothorax. HEART AND MEDIASTINUM: No acute abnormality of the cardiac and mediastinal silhouettes. BONES AND SOFT TISSUES: No acute osseous abnormality. IMPRESSION: 1. No acute process. Electronically signed by: Pinkie Pebbles MD 02/09/2024 08:46 PM EST RP Workstation: [id number]   DG Pelvis Portable Result Date: 02/09/2024 EXAM: 1 Views Xray of the Pelvis 02/09/2024 08:38:55 PM COMPARISON: None available. CLINICAL HISTORY: Fall FINDINGS: BONES AND JOINTS: No acute fracture. No malalignment. Hemisacralization of the right L5 vertebral body. SOFT TISSUES: Unremarkable. IMPRESSION: 1. No evidence of acute traumatic injury. Electronically signed by: Pinkie Pebbles MD 02/09/2024 08:45 PM EST RP Workstation: [id number]   DG Lumbar Spine Complete Result Date: 02/09/2024 EXAM: 4 Views Xray of the Lumbar Spine 02/09/2024 08:38:55 PM COMPARISON: None available. CLINICAL HISTORY: Fall FINDINGS: LUMBAR SPINE: BONES: Vertebral body heights are maintained.  Alignment is normal. Grade 1 anterolisthesis of L4 on L5. DISCS AND DEGENERATIVE CHANGES: Mild degenerative changes of the lower lumbar spine. SOFT TISSUES: No acute abnormality. IMPRESSION: 1. No traumatic injury. 2. Mild degenerative changes of the lower lumbar spine. Electronically signed by: Pinkie Pebbles MD 02/09/2024 08:45 PM EST RP Workstation: [id number]    EKG: I independently viewed the EKG done and my findings are as followed: Atrial fibrillation rate of 155.  QTc 471.  Assessment/Plan Present on Admission:  Atrial fibrillation with rapid ventricular response (HCC)  Principal Problem:   Atrial fibrillation with rapid ventricular response (HCC)  Paroxysmal A-fib with RVR with concern for medication  noncompliance versus thyroid  storm Added propranolol  for uncontrolled hyperthyroidism IV hydrocortisone  300 mg x 1 ordered in the ER. IV fluid hydration NS at 100 cc/h x 1 day. Follow UA and urine culture. Resume home Eliquis  for CVA prevention. Closely monitor in stepdown unit.  Uncontrolled hyperthyroidism. Unclear if compliant with home medications Resume home methimazole  Presented with TSH of less than 0.100. Follow free T4, T3.  Acute metabolic encephalopathy secondary to the above Resume home medications. Continue to treat underlying conditions. Reorient as needed.  Fall and aspiration precautions.  Leukocytosis, POA SIRS, POA Presented with WBC 12.3, tachycardia 139, and tachypnea 28. Follow UA and urine culture Follow peripheral blood cultures x 2. Monitor fever curve and WBCs.  High anion gap metabolic acidosis, mild Presented with serum bicarb of 21 with anion gap of 16 Continue IV fluid hydration Repeat BMP in the morning.  Isolated elevated AST AST 59, ALT 30 Avoid hepatotoxic agents Repeat CMP in the morning  Chronic HFpEF Euvolemic on exam. Last 2D echo done on 06/13/2022 revealed LVEF 65 to 70% with grade 1 diastolic dysfunction. Monitor strict  I's and O's and daily weight while on IV fluid.  Chronic anxiety disorder Agitation On Klonopin  and Ativan  prior to admission. Received 1 dose of IV Haldol  1 mg x 1 for agitation. 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 PT OT evaluation Fall precautions.   Critical care time: 55 minutes.   DVT prophylaxis: Home Eliquis  twice daily.  Code Status: Full code.  Family Communication: Son, and another significant other, at bedside.  Disposition Plan: Admitted to stepdown unit.  Consults called: None.  Admission status: Inpatient status.   Status is: Inpatient The patient requires at least 2 midnights for further evaluation and treatment of present condition.   Terry LOISE Hurst MD [facility] Pager [phone]  If 7PM-7AM, please contact night-coverage [email or web address] Password [email or web address]  02/09/2024, 9:58 PM      [1]  Allergies Allergen Reactions   Albuterol    Bactrim [Sulfamethoxazole-Trimethoprim]    Vytorin [Ezetimibe -Simvastatin]     Myalgias    "

## 2024-02-09 NOTE — ED Provider Notes (Signed)
 " [place] EMERGENCY DEPARTMENT AT [facility] Provider Note   CSN: [id number] Arrival date & time: 02/09/24  8063     Patient presents with: Debra Graves   SOLANGEL MCMANAWAY is a 69 y.o. female.   Pt is a 69 yo female with pmhx significant for afib (on eliquis ), GERD, hyperthyroidism, depression, anxiety, CAD, and adrenal insufficiency.  Pt called EMS today for multiple falls.  Pt is confused and is unable to tell me a coherent hx.  She does say she's been taking her Eliquis , but does not know what day it is.  So, it's unclear if she's been taking her meds.  She does complain of LBP, but it looks like she has a hx of vertebral compression fx.  Pt is able to follow commands.  She is moving all 4 extremities.  She denies vision changes.         Prior to Admission medications  Medication Sig Start Date End Date Taking? Authorizing Provider  clonazePAM  (KLONOPIN ) 0.5 MG tablet Take 1 tablet (0.5 mg total) by mouth daily. 01/27/24   Bevely Doffing, FNP  Docusate Calcium  (STOOL SOFTENER PO) Take 2 tablets by mouth daily.    [provider]  DULoxetine  (CYMBALTA ) 60 MG capsule Take 60 mg by mouth daily.    [provider]  ELIQUIS  5 MG TABS tablet Take 1 tablet (5 mg total) by mouth 2 (two) times daily. 10/26/23   Debera Jayson MATSU, MD  esomeprazole  (NEXIUM ) 20 MG capsule TAKE ONE CAPSULE BY MOUTH TWICE DAILY BEFORE A MEAL 01/12/24   Ezzard Sonny RAMAN, PA-C  ezetimibe  (ZETIA ) 10 MG tablet Take 1 tablet (10 mg total) by mouth daily. 09/28/23 01/27/24  Debera Jayson MATSU, MD  fish oil-omega-3 fatty acids 1000 MG capsule Take 2 g by mouth daily.    [provider]  furosemide  (LASIX ) 20 MG tablet Take 1 tablet (20 mg total) by mouth 2 (two) times daily. 09/28/23   Debera Jayson MATSU, MD  hydrocortisone  (CORTEF ) 10 MG tablet Take 1 tablet (10 mg total) by mouth 2 (two) times daily. 12/30/23   Aletha Bene, MD  LORazepam  (ATIVAN ) 1 MG tablet Take 1 tablet (1 mg total) by mouth  every 8 (eight) hours as needed for anxiety. 01/27/24   Bevely Doffing, FNP  MAGNESIUM  PO Take by mouth daily.    [provider]  meloxicam (MOBIC) 7.5 MG tablet Take 7.5 mg by mouth 2 (two) times daily. 11/23/18   [provider]  methimazole  (TAPAZOLE ) 5 MG tablet Take 1 tablet (5 mg total) by mouth daily. 12/22/23   Therisa Benton PARAS, NP  metoprolol  succinate (TOPROL -XL) 100 MG 24 hr tablet Take 1 tablet (100 mg total) by mouth daily. Take with or immediately following a meal. 09/28/23 01/27/24  Debera Jayson MATSU, MD  potassium chloride  SA (KLOR-CON  M) 20 MEQ tablet Take 20 mEq by mouth daily. 03/25/23   [provider]  rosuvastatin  (CRESTOR ) 40 MG tablet Take 1 tablet (40 mg total) by mouth daily. 10/23/23   Debera Jayson MATSU, MD  tiZANidine  (ZANAFLEX ) 4 MG tablet Take 4 mg by mouth every 6 (six) hours. 12/30/23   [provider]  Vitamin D, Ergocalciferol, 2000 units CAPS Take 1 capsule by mouth daily.    [provider]    Allergies: Albuterol, Bactrim [sulfamethoxazole-trimethoprim], and Vytorin [ezetimibe -simvastatin]    Review of Systems  Musculoskeletal:  Positive for back pain.  Psychiatric/Behavioral:  Positive for confusion.   All other systems  reviewed and are negative.   Updated Vital Signs BP (!) 145/61   Pulse (!) 117   Temp 97.6 F (36.4 C) (Oral)   Resp (!) 24   Ht 5' 6 (1.676 m)   Wt 74 kg   SpO2 92%   BMI 26.33 kg/m   Physical Exam Vitals and nursing note reviewed.  Constitutional:      Appearance: Normal appearance.  HENT:     Head: Normocephalic and atraumatic.     Right Ear: External ear normal.     Left Ear: External ear normal.     Nose: Nose normal.     Mouth/Throat:     Mouth: Mucous membranes are dry.  Eyes:     Extraocular Movements: Extraocular movements intact.     Pupils: Pupils are equal, round, and reactive to light.  Cardiovascular:     Rate and Rhythm: Tachycardia present. Rhythm irregular.      Pulses: Normal pulses.     Heart sounds: Normal heart sounds.  Pulmonary:     Effort: Pulmonary effort is normal.     Breath sounds: Normal breath sounds.  Abdominal:     General: Abdomen is flat. Bowel sounds are normal.     Palpations: Abdomen is soft.  Musculoskeletal:        General: Normal range of motion.     Cervical back: Normal range of motion and neck supple.  Skin:    General: Skin is warm.     Capillary Refill: Capillary refill takes less than 2 seconds.  Neurological:     Mental Status: She is alert. She is disoriented.     Comments: Pt is following commands.  She loses track of what she's saying.  She thinks it's Feb.  She does know the year.    Psychiatric:        Mood and Affect: Mood is anxious.     (all labs ordered are listed, but only abnormal results are displayed) Labs Reviewed  CBC WITH DIFFERENTIAL/PLATELET - Abnormal; Notable for the following components:      Result Value   WBC 12.3 (*)    RBC 3.80 (*)    Hemoglobin 10.3 (*)    HCT 33.3 (*)    Neutro Abs 8.5 (*)    Monocytes Absolute 1.5 (*)    All other components within normal limits  COMPREHENSIVE METABOLIC PANEL WITH GFR - Abnormal; Notable for the following components:   CO2 21 (*)    Glucose, Bld 110 (*)    BUN 33 (*)    Calcium  10.9 (*)    Total Protein 6.0 (*)    Albumin 3.2 (*)    AST 59 (*)    Anion gap 16 (*)    All other components within normal limits  TSH - Abnormal; Notable for the following components:   TSH <0.100 (*)    All other components within normal limits  CBG MONITORING, ED - Abnormal; Notable for the following components:   Glucose-Capillary 108 (*)    All other components within normal limits  TROPONIN T, HIGH SENSITIVITY - Abnormal; Notable for the following components:   Troponin T High Sensitivity 34 (*)    All other components within normal limits  MAGNESIUM   CK  URINALYSIS, ROUTINE W REFLEX MICROSCOPIC  T4, FREE  T3, FREE  HIV ANTIBODY (ROUTINE TESTING  W REFLEX)  TROPONIN T, HIGH SENSITIVITY    EKG: EKG Interpretation Date/Time:  Tuesday February 09 2024 19:55:55 EST Ventricular Rate:  155  PR Interval:    QRS Duration:  136 QT Interval:  302 QTC Calculation: 471 R Axis:   -8  Text Interpretation: Wide-QRS tachycardia Ventricular premature complex IVCD, consider atypical RBBB Artifact in lead(s) I II III aVR aVL aVF Since last tracing rate faster Confirmed by Dean Clarity [phone]) on 02/09/2024 8:50:10 PM  Radiology: CT Cervical Spine Wo Contrast Result Date: 02/09/2024 EXAM: CT CERVICAL SPINE WITHOUT CONTRAST 02/09/2024 08:47:36 PM TECHNIQUE: CT of the cervical spine was performed without the administration of intravenous contrast. Multiplanar reformatted images are provided for review. Automated exposure control, iterative reconstruction, and/or weight based adjustment of the mA/kV was utilized to reduce the radiation dose to as low as reasonably achievable. COMPARISON: None available. CLINICAL HISTORY: Polytrauma, blunt FINDINGS: BONES AND ALIGNMENT: No acute fracture or traumatic malalignment. DEGENERATIVE CHANGES: No significant degenerative changes. SOFT TISSUES: No prevertebral soft tissue swelling. IMPRESSION: 1. No significant abnormality. Electronically signed by: Pinkie Pebbles MD 02/09/2024 08:51 PM EST RP Workstation: [id number]   CT Head Wo Contrast Result Date: 02/09/2024 EXAM: CT HEAD WITHOUT CONTRAST 02/09/2024 08:47:36 PM TECHNIQUE: CT of the head was performed without the administration of intravenous contrast. Automated exposure control, iterative reconstruction, and/or weight based adjustment of the mA/kV was utilized to reduce the radiation dose to as low as reasonably achievable. COMPARISON: None available. CLINICAL HISTORY: Polytrauma, blunt. FINDINGS: BRAIN AND VENTRICLES: No acute hemorrhage. No evidence of acute infarct. No hydrocephalus. No extra-axial collection. No mass effect or midline shift. Bilateral basal  ganglia mineralization. ORBITS: No acute abnormality. SINUSES: No acute abnormality. SOFT TISSUES AND SKULL: No acute soft tissue abnormality. No skull fracture. VASCULATURE: Atherosclerotic calcifications of carotid siphons and vertebral arteries. IMPRESSION: 1. No acute intracranial abnormality. Electronically signed by: Pinkie Pebbles MD 02/09/2024 08:50 PM EST RP Workstation: [id number]   DG Chest Portable 1 View Result Date: 02/09/2024 EXAM: 1 VIEW XRAY OF THE CHEST 02/09/2024 08:38:55 PM COMPARISON: 03/20/2023 CLINICAL HISTORY: Fall FINDINGS: LUNGS AND PLEURA: No focal pulmonary opacity. No pleural effusion. No pneumothorax. HEART AND MEDIASTINUM: No acute abnormality of the cardiac and mediastinal silhouettes. BONES AND SOFT TISSUES: No acute osseous abnormality. IMPRESSION: 1. No acute process. Electronically signed by: Pinkie Pebbles MD 02/09/2024 08:46 PM EST RP Workstation: [id number]   DG Pelvis Portable Result Date: 02/09/2024 EXAM: 1 Views Xray of the Pelvis 02/09/2024 08:38:55 PM COMPARISON: None available. CLINICAL HISTORY: Fall FINDINGS: BONES AND JOINTS: No acute fracture. No malalignment. Hemisacralization of the right L5 vertebral body. SOFT TISSUES: Unremarkable. IMPRESSION: 1. No evidence of acute traumatic injury. Electronically signed by: Pinkie Pebbles MD 02/09/2024 08:45 PM EST RP Workstation: [id number]   DG Lumbar Spine Complete Result Date: 02/09/2024 EXAM: 4 Views Xray of the Lumbar Spine 02/09/2024 08:38:55 PM COMPARISON: None available. CLINICAL HISTORY: Fall FINDINGS: LUMBAR SPINE: BONES: Vertebral body heights are maintained. Alignment is normal. Grade 1 anterolisthesis of L4 on L5. DISCS AND DEGENERATIVE CHANGES: Mild degenerative changes of the lower lumbar spine. SOFT TISSUES: No acute abnormality. IMPRESSION: 1. No traumatic injury. 2. Mild degenerative changes of the lower lumbar spine. Electronically signed by: Pinkie Pebbles MD 02/09/2024 08:45 PM EST RP  Workstation: [id number]     Procedures   Medications Ordered in the ED  diltiazem  (CARDIZEM ) 1 mg/mL load via infusion 20 mg (20 mg Intravenous Bolus from Bag 02/09/24 2058)    And  diltiazem  (CARDIZEM ) 125 mg in dextrose  5% 125 mL (1 mg/mL) infusion (10 mg/hr Intravenous Rate/Dose Change 02/09/24 2146)  acetaminophen  (TYLENOL ) tablet 650 mg (has no administration  in time range)  metoprolol  tartrate (LOPRESSOR ) injection 5 mg (has no administration in time range)  LORazepam  (ATIVAN ) injection 0.5 mg (has no administration in time range)  hydrocortisone  sod succinate (SOLU-CORTEF ) injection 300 mg (has no administration in time range)  apixaban  (ELIQUIS ) tablet 5 mg (has no administration in time range)  acetaminophen  (TYLENOL ) tablet 650 mg (has no administration in time range)  prochlorperazine  (COMPAZINE ) injection 5 mg (has no administration in time range)  polyethylene glycol (MIRALAX  / GLYCOLAX ) packet 17 g (has no administration in time range)  melatonin tablet 6 mg (has no administration in time range)  sodium chloride  0.9 % bolus 500 mL (500 mLs Intravenous New Bag/Given 02/09/24 2057)  LORazepam  (ATIVAN ) injection 0.5 mg (0.5 mg Intravenous Given 02/09/24 2058)                                    Medical Decision Making Amount and/or Complexity of Data Reviewed Labs: ordered. Radiology: ordered.  Risk OTC drugs. Prescription drug management. Decision regarding hospitalization.   This patient presents to the ED for concern of aloc, this involves an extensive number of treatment options, and is a complaint that carries with it a high risk of complications and morbidity.  The differential diagnosis includes cva, head injury, infection, afib with rvr, electrolyte abn   Co morbidities that complicate the patient evaluation  afib (on eliquis ), GERD, hyperthyroidism, depression, anxiety, CAD, and adrenal insufficiency.   Additional history obtained:  Additional history  obtained from epic chart review External records from outside source obtained and reviewed including EMS report   Lab Tests:  I Ordered, and personally interpreted labs.  The pertinent results include:  cbc with hgb low at 10.3 (11.4 last month); cmp nl other than bun sl elevated at 33 and calcium  sl elevated at 10.9; mg nl at 1.8; ck nl at 72; TSH low at <0.100; trop elevated at 34   Imaging Studies ordered:  I ordered imaging studies including cxr, ct head/c-spine, pelvis, lumbar spine  I independently visualized and interpreted imaging which showed  ct head: No acute intracranial abnormality.  CT c-spine: No significant abnormality.  CXR: No acute process.  Pelvis: No evidence of acute traumatic injury.  Lumbar: 1. No traumatic injury.  2. Mild degenerative changes of the lower lumbar spine.   I agree with the radiologist interpretation   Cardiac Monitoring:  The patient was maintained on a cardiac monitor.  I personally viewed and interpreted the cardiac monitored which showed an underlying rhythm of: afib with rvr   Medicines ordered and prescription drug management:  I ordered medication including cardizem   for afib  Reevaluation of the patient after these medicines showed that the patient improved I have reviewed the patients home medicines and have made adjustments as needed   Test Considered:  ct   Critical Interventions:  cardizem    Consultations Obtained:  I requested consultation with the hospitalist (Dr. Shona),  and discussed lab and imaging findings as well as pertinent plan - she will admit   Problem List / ED Course:  Afib with RVR:  improved with cardizem .  Iv lopressor  will also be added.  It is unclear if pt has been taking her Eliquis , so I did not feel comfortable cardioverting in the ED.  Pt given her evening dose of Eliquis  once I made sure trauma scans were neg. CHA2DS2/VAS Stroke Risk Points  Current as of 6  minutes ago     4 >= 2  Points: High Risk  1 to 1.99 Points: Medium Risk  0 Points: Low Risk    Last Change: N/A      Details    This score determines the patient's risk of having a stroke if the  patient has atrial fibrillation.       Points Metrics  0 Has Congestive Heart Failure:  No    Current as of 6 minutes ago  1 Has Vascular Disease:  Yes    Current as of 6 minutes ago  1 Has Hypertension:  Yes     Current as of 6 minutes ago  1 Age:  58    Current as of 6 minutes ago  0 Has Diabetes Excluding Gestational Diabetes:  No    Current as of 6 minutes ago  0 Had Stroke:  No  Had TIA:  No  Had Thromboembolism:  No    Current as of 6 minutes ago  1 Female:  Yes    Current as of 6 minutes ago           Hyperthyroidism:  TSH  undetectable.  T3 and T4 levels added on.  Pt given lopressor  and solu-cortef .  She will need to restart her methimazole  as well. Fall:  no internal injury.   Reevaluation:  After the interventions noted above, I reevaluated the patient and found that they have :improved   Social Determinants of Health:  Lives at home   Dispostion:  After consideration of the diagnostic results and the patients response to treatment, I feel that the patent would benefit from admission.  CRITICAL CARE Performed by: Mliss Boyers   Total critical care time: 30 minutes  Critical care time was exclusive of separately billable procedures and treating other patients.  Critical care was necessary to treat or prevent imminent or life-threatening deterioration.  Critical care was time spent personally by me on the following activities: development of treatment plan with patient and/or surrogate as well as nursing, discussions with consultants, evaluation of patient's response to treatment, examination of patient, obtaining history from patient or surrogate, ordering and performing treatments and interventions, ordering and review of laboratory studies, ordering and review of radiographic  studies, pulse oximetry and re-evaluation of patient's condition.        Final diagnoses:  Atrial fibrillation with RVR (HCC)  Fall in home, initial encounter  Hyperthyroidism    ED Discharge Orders     None          Boyers Mliss, MD 02/09/24 2200  "

## 2024-02-10 DIAGNOSIS — Y92009 Unspecified place in unspecified non-institutional (private) residence as the place of occurrence of the external cause: Secondary | ICD-10-CM

## 2024-02-10 DIAGNOSIS — W19XXXA Unspecified fall, initial encounter: Secondary | ICD-10-CM | POA: Diagnosis not present

## 2024-02-10 DIAGNOSIS — E0581 Other thyrotoxicosis with thyrotoxic crisis or storm: Secondary | ICD-10-CM | POA: Diagnosis not present

## 2024-02-10 DIAGNOSIS — E0590 Thyrotoxicosis, unspecified without thyrotoxic crisis or storm: Secondary | ICD-10-CM

## 2024-02-10 DIAGNOSIS — G9341 Metabolic encephalopathy: Secondary | ICD-10-CM | POA: Diagnosis not present

## 2024-02-10 DIAGNOSIS — I4891 Unspecified atrial fibrillation: Secondary | ICD-10-CM | POA: Diagnosis not present

## 2024-02-10 LAB — BLOOD GAS, VENOUS
Acid-Base Excess: 0.8 mmol/L (ref 0.0–2.0)
Bicarbonate: 26 mmol/L (ref 20.0–28.0)
Drawn by: 27160
O2 Saturation: 72.1 %
Patient temperature: 36.8
pCO2, Ven: 43 mmHg — ABNORMAL LOW (ref 44–60)
pH, Ven: 7.39 (ref 7.25–7.43)
pO2, Ven: 43 mmHg (ref 32–45)

## 2024-02-10 LAB — URINALYSIS, W/ REFLEX TO CULTURE (INFECTION SUSPECTED)
Bilirubin Urine: NEGATIVE
Glucose, UA: NEGATIVE mg/dL
Hgb urine dipstick: NEGATIVE
Ketones, ur: NEGATIVE mg/dL
Nitrite: NEGATIVE
Protein, ur: 30 mg/dL — AB
Specific Gravity, Urine: 1.017 (ref 1.005–1.030)
pH: 5 (ref 5.0–8.0)

## 2024-02-10 LAB — FOLATE: Folate: 10.2 ng/mL

## 2024-02-10 LAB — COMPREHENSIVE METABOLIC PANEL WITH GFR
ALT: 28 U/L (ref 0–44)
AST: 54 U/L — ABNORMAL HIGH (ref 15–41)
Albumin: 3.8 g/dL (ref 3.5–5.0)
Alkaline Phosphatase: 102 U/L (ref 38–126)
Anion gap: 12 (ref 5–15)
BUN: 32 mg/dL — ABNORMAL HIGH (ref 8–23)
CO2: 25 mmol/L (ref 22–32)
Calcium: 10.4 mg/dL — ABNORMAL HIGH (ref 8.9–10.3)
Chloride: 101 mmol/L (ref 98–111)
Creatinine, Ser: 1.07 mg/dL — ABNORMAL HIGH (ref 0.44–1.00)
GFR, Estimated: 56 mL/min — ABNORMAL LOW
Glucose, Bld: 135 mg/dL — ABNORMAL HIGH (ref 70–99)
Potassium: 4 mmol/L (ref 3.5–5.1)
Sodium: 138 mmol/L (ref 135–145)
Total Bilirubin: 0.8 mg/dL (ref 0.0–1.2)
Total Protein: 5.5 g/dL — ABNORMAL LOW (ref 6.5–8.1)

## 2024-02-10 LAB — CBC WITH DIFFERENTIAL/PLATELET
Abs Immature Granulocytes: 0.03 K/uL (ref 0.00–0.07)
Basophils Absolute: 0 K/uL (ref 0.0–0.1)
Basophils Relative: 0 %
Eosinophils Absolute: 0 K/uL (ref 0.0–0.5)
Eosinophils Relative: 0 %
HCT: 31.6 % — ABNORMAL LOW (ref 36.0–46.0)
Hemoglobin: 9.7 g/dL — ABNORMAL LOW (ref 12.0–15.0)
Immature Granulocytes: 0 %
Lymphocytes Relative: 20 %
Lymphs Abs: 2.3 K/uL (ref 0.7–4.0)
MCH: 27 pg (ref 26.0–34.0)
MCHC: 30.7 g/dL (ref 30.0–36.0)
MCV: 88 fL (ref 80.0–100.0)
Monocytes Absolute: 1.3 K/uL — ABNORMAL HIGH (ref 0.1–1.0)
Monocytes Relative: 12 %
Neutro Abs: 7.5 K/uL (ref 1.7–7.7)
Neutrophils Relative %: 68 %
Platelets: 317 K/uL (ref 150–400)
RBC: 3.59 MIL/uL — ABNORMAL LOW (ref 3.87–5.11)
RDW: 14.6 % (ref 11.5–15.5)
WBC: 11.2 K/uL — ABNORMAL HIGH (ref 4.0–10.5)
nRBC: 0 % (ref 0.0–0.2)

## 2024-02-10 LAB — MRSA NEXT GEN BY PCR, NASAL: MRSA by PCR Next Gen: NOT DETECTED

## 2024-02-10 LAB — PHOSPHORUS: Phosphorus: 5 mg/dL — ABNORMAL HIGH (ref 2.5–4.6)

## 2024-02-10 LAB — TROPONIN T, HIGH SENSITIVITY: Troponin T High Sensitivity: 41 ng/L — ABNORMAL HIGH (ref 0–19)

## 2024-02-10 LAB — VITAMIN B12: Vitamin B-12: 666 pg/mL (ref 180–914)

## 2024-02-10 LAB — T4, FREE: Free T4: >5.5 ng/dL — ABNORMAL HIGH (ref 0.80–2.00)

## 2024-02-10 LAB — HIV ANTIBODY (ROUTINE TESTING W REFLEX): HIV Screen 4th Generation wRfx: NONREACTIVE

## 2024-02-10 MED ORDER — POTASSIUM CHLORIDE CRYS ER 20 MEQ PO TBCR
40.0000 meq | EXTENDED_RELEASE_TABLET | Freq: Two times a day (BID) | ORAL | Status: AC
  Administered 2024-02-10: 40 meq via ORAL
  Filled 2024-02-10 (×2): qty 2

## 2024-02-10 MED ORDER — MAGNESIUM SULFATE 2 GM/50ML IV SOLN
2.0000 g | Freq: Once | INTRAVENOUS | Status: AC
  Administered 2024-02-10: 2 g via INTRAVENOUS
  Filled 2024-02-10: qty 50

## 2024-02-10 MED ORDER — OLANZAPINE 10 MG IM SOLR
2.5000 mg | Freq: Once | INTRAMUSCULAR | Status: AC | PRN
  Administered 2024-02-10: 2.5 mg via INTRAMUSCULAR

## 2024-02-10 MED ORDER — LORAZEPAM 2 MG/ML IJ SOLN
1.0000 mg | Freq: Four times a day (QID) | INTRAMUSCULAR | Status: DC | PRN
  Administered 2024-02-10 – 2024-02-12 (×5): 1 mg via INTRAVENOUS
  Filled 2024-02-10 (×5): qty 1

## 2024-02-10 MED ORDER — METOPROLOL TARTRATE 5 MG/5ML IV SOLN
5.0000 mg | Freq: Four times a day (QID) | INTRAVENOUS | Status: DC
  Administered 2024-02-10 – 2024-02-11 (×4): 5 mg via INTRAVENOUS
  Filled 2024-02-10 (×5): qty 5

## 2024-02-10 MED ORDER — NICOTINE 7 MG/24HR TD PT24
7.0000 mg | MEDICATED_PATCH | Freq: Every day | TRANSDERMAL | Status: AC
  Administered 2024-02-11 (×2): 7 mg via TRANSDERMAL
  Filled 2024-02-10 (×2): qty 1

## 2024-02-10 MED ORDER — APIXABAN 5 MG PO TABS
5.0000 mg | ORAL_TABLET | Freq: Two times a day (BID) | ORAL | Status: DC
  Administered 2024-02-10 – 2024-02-13 (×8): 5 mg via ORAL
  Filled 2024-02-10 (×8): qty 1

## 2024-02-10 MED ORDER — SODIUM CHLORIDE 0.9 % IV SOLN
1.0000 g | INTRAVENOUS | Status: DC
  Administered 2024-02-10 – 2024-02-13 (×4): 1 g via INTRAVENOUS
  Filled 2024-02-10 (×5): qty 10

## 2024-02-10 MED ORDER — HALOPERIDOL LACTATE 5 MG/ML IJ SOLN
1.0000 mg | INTRAMUSCULAR | Status: AC
  Administered 2024-02-10: 1 mg via INTRAVENOUS
  Filled 2024-02-10: qty 1

## 2024-02-10 MED ORDER — HYDROCORTISONE SOD SUC (PF) 100 MG IJ SOLR
50.0000 mg | Freq: Two times a day (BID) | INTRAMUSCULAR | Status: DC
  Administered 2024-02-10 (×2): 50 mg via INTRAVENOUS
  Filled 2024-02-10 (×2): qty 2

## 2024-02-10 MED ORDER — METHIMAZOLE 5 MG PO TABS
5.0000 mg | ORAL_TABLET | Freq: Two times a day (BID) | ORAL | Status: DC
  Administered 2024-02-10 – 2024-02-11 (×2): 5 mg via ORAL
  Filled 2024-02-10 (×3): qty 1

## 2024-02-10 MED ORDER — LIDOCAINE 5 % EX PTCH
1.0000 | MEDICATED_PATCH | CUTANEOUS | Status: AC
  Administered 2024-02-10 – 2024-02-11 (×2): 1 via TRANSDERMAL
  Filled 2024-02-10 (×2): qty 1

## 2024-02-10 MED ORDER — ORAL CARE MOUTH RINSE: 15.0000 mL | OROMUCOSAL | Status: AC | PRN

## 2024-02-10 NOTE — Progress Notes (Signed)
 "          PROGRESS NOTE  Debra Graves FMW:[id number] DOB: [birth year] DOA: 02/09/2024 PCP: Bevely Doffing, FNP  Brief History:  69 year old female with a history of paroxysmal atrial fibrillation on apixaban , hyperthyroidism, hypertension, hyperlipidemia, coronary disease, anxiety presenting with multiple falls at home. The patient was confused at the time of presentation and was unable to provide any history.  In the ED, the patient was tachycardic with atrial fibrillation with RVR with heart rates of 150s.  The patient was afebrile and hemodynamically stable.  She was started on diltiazem  drip.   Assessment/Plan: Atrial fibrillation with RVR -Continue diltiazem  drip - Restart metoprolol  succinate - Continue apixaban   Hyperthyroidism - 12/14/2023 TSH <0.100 -12/14/2023 free T4---0.61 -12/14/2023 thyroid  functions were improving with regard to her hyperthyroidism when compared to prior studies - 02/09/2024 TSH <0.100 - Follow-up free T4 - Increase methimazole  to 5 mg twice daily - Continue empiric Solu-Cortef  for now  Acute metabolic encephalopathy - B12 - Folic acid  - Ammonia - VBG - CT brain - UA 11-20 WBC>> send urine culture - Send urine culture - PDMP reviewed-- -Ativan  1 mg, #90, last refill 01/27/2024 - Klonopin  0.5 mg, #30, last refill 01/27/2024  Leukemoid reaction - Likely stress demargination - Start empiric ceftriaxone  - Follow urine and blood cultures - Patient is afebrile and hemodynamically stable  Chronic HFpEF - 06/13/2022 echo EF 65 to 70%, grade 1 DD  Anxiety - Patient is on baseline Ativan  and Klonopin  at home - PDMP reviewed as discussed above  Generalized weakness and multiple falls - PT evaluation once patient stable  CKD stage III A - Baseline creatinine 0.9-1.2 - Monitor BMP     Family Communication:  no Family at bedside  Consultants:  cardiology  Code Status:  FULL   DVT Prophylaxis:  apixaban    Procedures: As Listed in  Progress Note Above  Antibiotics: Ceftriaxone  1/21>>      Subjective: Patient is confused and intermittently agitated.  She is able to follow one-step commands.  Denies any chest pain, shortness breath, abdominal pain.  Review of systems limited secondary to metabolic encephalopathy.  Objective: Vitals:   02/10/24 0100 02/10/24 0200 02/10/24 0643 02/10/24 0700  BP: (!) 118/50 (!) 138/93 131/66 (!) 116/41  Pulse: (!) 140 (!) 110 (!) 102 96  Resp: (!) 25 (!) 22 (!) 24 (!) 28  Temp:      TempSrc:      SpO2: 95% 97% 97% 93%  Weight:      Height:        Intake/Output Summary (Last 24 hours) at 02/10/2024 0736 Last data filed at 02/10/2024 9356 Gross per 24 hour  Intake 240 ml  Output 400 ml  Net -160 ml   Weight change:  Exam:  General:  Pt is alert, follows commands appropriately, not in acute distress HEENT: No icterus, No thrush, No neck mass, [place]/AT Cardiovascular: IRRR, S1/S2, no rubs, no gallops Respiratory: Bibasilar crackles.  No wheezing.  Air movement Abdomen: Soft/+BS, non tender, non distended, no guarding Extremities: 1 +LE edema, No lymphangitis, No petechiae, No rashes, no synovitis   Data Reviewed: I have personally reviewed following labs and imaging studies Basic Metabolic Panel: Recent Labs  Lab 02/09/24 2046 02/10/24 0056  NA 139 138  K 3.8 4.0  CL 102 101  CO2 21* 25  GLUCOSE 110* 135*  BUN 33* 32*  CREATININE 0.99 1.07*  CALCIUM  10.9* 10.4*  MG 1.8  --   PHOS  --  5.0*   Liver Function Tests: Recent Labs  Lab 02/09/24 2046 02/10/24 0056  AST 59* 54*  ALT 30 28  ALKPHOS 74 102  BILITOT 0.8 0.8  PROT 6.0* 5.5*  ALBUMIN 3.2* 3.8   No results for input(s): LIPASE, AMYLASE in the last 168 hours. No results for input(s): AMMONIA in the last 168 hours. Coagulation Profile: No results for input(s): INR, PROTIME in the last 168 hours. CBC: Recent Labs  Lab 02/09/24 2046 02/10/24 0056  WBC 12.3* 11.2*  NEUTROABS 8.5* 7.5   HGB 10.3* 9.7*  HCT 33.3* 31.6*  MCV 87.6 88.0  PLT 321 317   Cardiac Enzymes: Recent Labs  Lab 02/09/24 2046  CKTOTAL 72   BNP: Invalid input(s): POCBNP CBG: Recent Labs  Lab 02/09/24 2056  GLUCAP 108*   HbA1C: No results for input(s): HGBA1C in the last 72 hours. Urine analysis:    Component Value Date/Time   COLORURINE YELLOW 02/10/2024 0705   APPEARANCEUR CLOUDY (A) 02/10/2024 0705   LABSPEC 1.017 02/10/2024 0705   PHURINE 5.0 02/10/2024 0705   GLUCOSEU NEGATIVE 02/10/2024 0705   HGBUR NEGATIVE 02/10/2024 0705   BILIRUBINUR NEGATIVE 02/10/2024 0705   BILIRUBINUR small (A) 12/16/2022 1620   KETONESUR NEGATIVE 02/10/2024 0705   PROTEINUR 30 (A) 02/10/2024 0705   UROBILINOGEN 0.2 12/16/2022 1620   NITRITE NEGATIVE 02/10/2024 0705   LEUKOCYTESUR TRACE (A) 02/10/2024 0705   Sepsis Labs: @LABRCNTIP (procalcitonin:4,lacticidven:4) ) Recent Results (from the past 240 hours)  Culture, blood (Routine X 2) w Reflex to ID Panel     Status: None (Preliminary result)   Collection Time: 02/10/24 12:56 AM   Specimen: BLOOD  Result Value Ref Range Status   Specimen Description BLOOD BLOOD LEFT ARM  Final   Special Requests   Final    BOTTLES DRAWN AEROBIC AND ANAEROBIC Blood Culture adequate volume   Culture   Final    NO GROWTH < 12 HOURS Performed at [facility], [address] [place], [place] [address]    Report Status PENDING  Incomplete  Culture, blood (Routine X 2) w Reflex to ID Panel     Status: None (Preliminary result)   Collection Time: 02/10/24 12:56 AM   Specimen: BLOOD  Result Value Ref Range Status   Specimen Description BLOOD BLOOD LEFT ARM  Final   Special Requests   Final    BOTTLES DRAWN AEROBIC AND ANAEROBIC Blood Culture adequate volume   Culture   Final    NO GROWTH < 12 HOURS Performed at [facility], [address], [place], [place] [address]    Report Status PENDING  Incomplete     Scheduled Meds:  Chlorhexidine  Gluconate Cloth   6 each Topical Daily   DULoxetine   60 mg Oral Daily   ezetimibe   10 mg Oral Daily   hydrocortisone   10 mg Oral BID   lidocaine   1 patch Transdermal Q24H   methimazole   5 mg Oral Daily   propranolol   10 mg Oral BID   rosuvastatin   40 mg Oral Daily   Continuous Infusions:  sodium chloride  100 mL/hr at 02/09/24 2344   diltiazem  (CARDIZEM ) infusion 15 mg/hr (02/10/24 0358)    Procedures/Studies: CT Cervical Spine Wo Contrast Result Date: 02/09/2024 EXAM: CT CERVICAL SPINE WITHOUT CONTRAST 02/09/2024 08:47:36 PM TECHNIQUE: CT of the cervical spine was performed without the administration of intravenous contrast. Multiplanar reformatted images are provided for review. Automated exposure control, iterative reconstruction, and/or weight based adjustment of the mA/kV was utilized to reduce  the radiation dose to as low as reasonably achievable. COMPARISON: None available. CLINICAL HISTORY: Polytrauma, blunt FINDINGS: BONES AND ALIGNMENT: No acute fracture or traumatic malalignment. DEGENERATIVE CHANGES: No significant degenerative changes. SOFT TISSUES: No prevertebral soft tissue swelling. IMPRESSION: 1. No significant abnormality. Electronically signed by: Pinkie Pebbles MD 02/09/2024 08:51 PM EST RP Workstation: [id number]   CT Head Wo Contrast Result Date: 02/09/2024 EXAM: CT HEAD WITHOUT CONTRAST 02/09/2024 08:47:36 PM TECHNIQUE: CT of the head was performed without the administration of intravenous contrast. Automated exposure control, iterative reconstruction, and/or weight based adjustment of the mA/kV was utilized to reduce the radiation dose to as low as reasonably achievable. COMPARISON: None available. CLINICAL HISTORY: Polytrauma, blunt. FINDINGS: BRAIN AND VENTRICLES: No acute hemorrhage. No evidence of acute infarct. No hydrocephalus. No extra-axial collection. No mass effect or midline shift. Bilateral basal ganglia mineralization. ORBITS: No acute abnormality. SINUSES: No acute  abnormality. SOFT TISSUES AND SKULL: No acute soft tissue abnormality. No skull fracture. VASCULATURE: Atherosclerotic calcifications of carotid siphons and vertebral arteries. IMPRESSION: 1. No acute intracranial abnormality. Electronically signed by: Pinkie Pebbles MD 02/09/2024 08:50 PM EST RP Workstation: [id number]   DG Chest Portable 1 View Result Date: 02/09/2024 EXAM: 1 VIEW XRAY OF THE CHEST 02/09/2024 08:38:55 PM COMPARISON: 03/20/2023 CLINICAL HISTORY: Fall FINDINGS: LUNGS AND PLEURA: No focal pulmonary opacity. No pleural effusion. No pneumothorax. HEART AND MEDIASTINUM: No acute abnormality of the cardiac and mediastinal silhouettes. BONES AND SOFT TISSUES: No acute osseous abnormality. IMPRESSION: 1. No acute process. Electronically signed by: Pinkie Pebbles MD 02/09/2024 08:46 PM EST RP Workstation: [id number]   DG Pelvis Portable Result Date: 02/09/2024 EXAM: 1 Views Xray of the Pelvis 02/09/2024 08:38:55 PM COMPARISON: None available. CLINICAL HISTORY: Fall FINDINGS: BONES AND JOINTS: No acute fracture. No malalignment. Hemisacralization of the right L5 vertebral body. SOFT TISSUES: Unremarkable. IMPRESSION: 1. No evidence of acute traumatic injury. Electronically signed by: Pinkie Pebbles MD 02/09/2024 08:45 PM EST RP Workstation: [id number]   DG Lumbar Spine Complete Result Date: 02/09/2024 EXAM: 4 Views Xray of the Lumbar Spine 02/09/2024 08:38:55 PM COMPARISON: None available. CLINICAL HISTORY: Fall FINDINGS: LUMBAR SPINE: BONES: Vertebral body heights are maintained. Alignment is normal. Grade 1 anterolisthesis of L4 on L5. DISCS AND DEGENERATIVE CHANGES: Mild degenerative changes of the lower lumbar spine. SOFT TISSUES: No acute abnormality. IMPRESSION: 1. No traumatic injury. 2. Mild degenerative changes of the lower lumbar spine. Electronically signed by: Pinkie Pebbles MD 02/09/2024 08:45 PM EST RP Workstation: [id number]   CT CHEST LUNG CA SCREEN LOW DOSE W/O CM Result  Date: 01/20/2024 CLINICAL DATA:  69 year old female former smoker with 49 pack-year smoking history, quit smoking 2 years prior EXAM: CT CHEST WITHOUT CONTRAST LOW-DOSE FOR LUNG CANCER SCREENING TECHNIQUE: Multidetector CT imaging of the chest was performed following the standard protocol without IV contrast. RADIATION DOSE REDUCTION: This exam was performed according to the departmental dose-optimization program which includes automated exposure control, adjustment of the mA and/or kV according to patient size and/or use of iterative reconstruction technique. COMPARISON:  03/17/2001 chest CT.  12/16/2022 chest radiograph. FINDINGS: Cardiovascular: Normal heart size. No significant pericardial effusion/thickening. Three-vessel coronary atherosclerosis. Atherosclerotic nonaneurysmal thoracic aorta. Normal caliber pulmonary arteries. Mediastinum/Nodes: No significant thyroid  nodules. Unremarkable esophagus. No pathologically enlarged axillary, mediastinal or hilar lymph nodes, noting limited sensitivity for the detection of hilar adenopathy on this noncontrast study. Lungs/Pleura: No pneumothorax. No pleural effusion. Mild paraseptal and centrilobular emphysema with diffuse bronchial wall thickening. No acute consolidative airspace disease or lung masses. No significant  pulmonary nodules. Upper abdomen: Large hiatal hernia.  Stomach is nondistended. Musculoskeletal: No aggressive appearing focal osseous lesions. Mild thoracic spondylosis. Moderate T5 vertebral compression fracture is of uncertain chronicity, appearing new since 12/16/2022 lateral chest radiograph, potentially acute or subacute. Chronic ununited posterolateral left seventh rib fracture. IMPRESSION: 1. Lung-RADS 1, negative. Continue annual screening with low-dose chest CT without contrast in 12 months. 2. Moderate T5 vertebral compression fracture, of uncertain chronicity, appearing new since 12/16/2022 lateral chest radiograph, potentially acute or  subacute. 3. Large hiatal hernia. 4. Three-vessel coronary atherosclerosis. 5. Aortic Atherosclerosis (ICD10-I70.0) and Emphysema (ICD10-J43.9). These results will be called to the ordering clinician or representative by the Radiologist Assistant, and communication documented in the PACS or [employer]. Electronically Signed   By: Selinda DELENA Blue M.D.   On: 01/20/2024 14:57    Alm Schneider, DO  [facility]  If 7PM-7AM, please contact night-coverage [email or web address] Password [facility] 02/10/2024, 7:36 AM   LOS: 1 day   "

## 2024-02-10 NOTE — Progress Notes (Signed)
 PT Cancellation Note  Patient Details Name: MILLEY VINING MRN: [id number] DOB: Aug 31, 1955   Cancelled Treatment:    Reason Eval/Treat Not Completed: Fatigue/lethargy limiting ability to participate. Patient unable to participate with therapy due to lethargy and confusion. Will check back tomorrow.   2:41 PM, 02/10/24 Lynwood Music, MPT Physical Therapist with [facility] [phone] office [phone] mobile phone

## 2024-02-10 NOTE — Plan of Care (Signed)
 Patients mentation has improved but still very impulsive.  Was able to eat a small amount for dinner.  Is oriented to self and know family names but not oriented to place or situation.

## 2024-02-10 NOTE — Progress Notes (Signed)
" °   02/10/24 1453  TOC Brief Assessment  Insurance and Status Reviewed  Patient has primary care physician Yes  Home environment has been reviewed From Home  Prior level of function: Unknown - unable to reach siblings and son said to call him back in the AM  Prior/Current Home Services No current home services  Social Drivers of Health Review SDOH reviewed no interventions necessary  Readmission risk has been reviewed Yes  Transition of care needs transition of care needs identified, TOC will continue to follow   She attempted to reach sister and brother and no response. CSW got in contact with son but he is working. CSW asked son when was a good time to talk and he said tomorrow morning. Will follow up tomorrow to further assess patient.  "

## 2024-02-10 NOTE — Hospital Course (Addendum)
 69 year old female with a history of paroxysmal atrial fibrillation on apixaban , hyperthyroidism, hypertension, hyperlipidemia, coronary disease, anxiety presenting with multiple falls at home. The patient was confused at the time of presentation and was unable to provide any history.  In the ED, the patient was tachycardic with atrial fibrillation with RVR with heart rates of 150s.  The patient was afebrile and hemodynamically stable.  She was started on diltiazem  drip.  The patient's HR was better controlled, but would have intermittent episodes of RVR in the setting of her agitation.  The patient remained agitated As result, more aggressive treatment for thyrotoxicosis was started. SSKI , hydrocortisone , metoprolol , and Tapazole  were started. The patient's mental status continued to decline.  She required NG tube placement to receive her medications.  She was started on a Precedex  drip which did help decrease her heart rate.  However, the drip had to be decreased secondary to bradycardia.  Patient remained agitated and required intermittent dosing Ativan .  In the interim, her mental status continued to worsen with new oxygen requirement and worsening serum creatinine. In the setting of worsening multisystem failure, critical care medicine was consulted.

## 2024-02-10 NOTE — Progress Notes (Signed)
 Received a call due to concern for paradoxical agitation after taking IV Ativan .  The patient is on p.o. Ativan  and Klonopin , prior to admission.  1 mg IV Haldol  administered in the setting of uncontrolled hyperthyroidism with nearly undetectable TSH.    Presented at bedside.  The patient is in soft restraints for patient's own safety.  Will continue to closely monitor and treat as indicated.   Time: 15 minutes.

## 2024-02-10 NOTE — Progress Notes (Signed)
 OT Cancellation Note  Patient Details Name: Debra Graves MRN: [id number] DOB: 31-Jan-1955   Cancelled Treatment:    Reason Eval/Treat Not Completed: Medical issues which prohibited therapy. MD requested hold on patient due to not being medically ready. Will attempt evaluation later as time permits and when pt is medically appropriate.   Debra Graves OT, MOT   Jayson Person 02/10/2024, 10:21 AM

## 2024-02-10 NOTE — Consult Note (Addendum)
 "  Cardiology Consultation   Patient ID: Debra Graves MRN: [id number]; DOB: 01/28/1955  Admit date: 02/09/2024 Date of Consult: 02/10/2024  PCP:  Bevely Doffing, FNP   [facility] Providers Cardiologist:  Jayson Sierras, MD      Patient Profile: Debra Graves is a 69 y.o. female with a hx of CAD, paroxysmal atrial fibrillation, carotid artery plaque, aortic valve insufficiency, HLD, hypothyroidism/hyperthyroidism (follow-up by endocrinology) who is being seen 02/10/2024 for the evaluation of A-fib with RVR at the request of Dr. Evonnie.  History of Present Illness: Ms. [name] has the following relevant cardiac history Cardiac cath 2006: moderate RCA disease Previously found to be in more persistent afib in setting of hyperthyroidism.  Heart monitor 08/2021: NSR with average HR 69 with 4% A-fib burden, rare PACs/PVCs Echo 05/2022: EF 65 to 70%, G1 DD, mild AV regurgitation  Last seen in heart care 09/2023 by Dr. Sierras.  Doing well from cardiac standpoint.  Started Zetia  10 mg daily.  Continued on Crestor  40 mg daily, Toprol -XL 100 mg daily, Eliquis  5 mg twice daily.  Presented to [facility] ED 1/20 for multiple falls at home, and worsening AMS.  Upon arrival to the ER, found to be in A-fib with RVR, HR 150s to 180s, tachypneic and confused.  Treated with Cardizem  drip and IV Lopressor . EKG: Afib, HR 150 with baseline artifact. Not c/w with wide QRS as indicated.  K 3.8, CR 0.99, Mg 1.8, TN 34 > 41, Hgb 10.3 > 9.7, WBC 12.3 > 11.2, TSH < 0.1, culture pending CXR with no acute findings.  Treated with propranolol  for uncontrolled hypothyroidism.  On interview, patient is found with sitter at bedside with mittens on hands. Attempted to interview patient but unable since patient was disoriented. Patient denied any chest pain, otherwise she did not answer any other questions. Attempted to call son but no answer.    Past Medical History:  Diagnosis Date   Adrenal insufficiency     Anxiety    Aortic insufficiency    Arthritis    Back pain    CAD (coronary artery disease)    Moderate ostial RCA disease at cardiac catheterization 2006   Constipation    Depression    Fibroids    Hypercholesteremia    Mild carotid artery disease    Rectocele     Past Surgical History:  Procedure Laterality Date   CARDIAC CATHETERIZATION  03/08/2004   mild progressive CAD   carotid duplex  03/09/2007   rt subclavian 0 to 49%,right bulb 0 to 49 %,lf ICA normal   COLONOSCOPY N/A 06/16/2013   Dr. Shaaron: hyperplastic polyps, melanosis coli    DOPPLER ECHOCARDIOGRAPHY  03/20/2010   EF >55 %,trace mitral regurg,mild tricuspid regurg,aortic valve mildly sclerotic'mild to moderate aortic regurg,trace pulmonic valver regurg   NM MYOCAR PERF WALL MOTION  03/19/06   EF 61%,low risk study       Scheduled Meds:  apixaban   5 mg Oral BID   Chlorhexidine  Gluconate Cloth  6 each Topical Daily   DULoxetine   60 mg Oral Daily   ezetimibe   10 mg Oral Daily   hydrocortisone  sod succinate (SOLU-CORTEF ) inj  50 mg Intravenous Q12H   lidocaine   1 patch Transdermal Q24H   methimazole   5 mg Oral BID   metoprolol  tartrate  5 mg Intravenous Q6H   potassium chloride   40 mEq Oral BID   rosuvastatin   40 mg Oral Daily   Continuous Infusions:  cefTRIAXone  (ROCEPHIN )  IV  diltiazem  (CARDIZEM ) infusion 15 mg/hr (02/10/24 0900)   magnesium  sulfate bolus IVPB     PRN Meds: acetaminophen , LORazepam , melatonin, mouth rinse, polyethylene glycol, prochlorperazine   Allergies:   Allergies[1]  Social History:   Social History   Socioeconomic History   Marital status: Divorced    Spouse name: Not on file   Number of children: Not on file   Years of education: Not on file   Highest education level: Not on file  Occupational History   Occupation: disability    Employer: UNEMPLOYED  Tobacco Use   Smoking status: Former    Current packs/day: 0.50    Average packs/day: 0.5 packs/day for 41.0 years  (20.5 ttl pk-yrs)    Types: Cigarettes   Smokeless tobacco: Never  Vaping Use   Vaping status: Never Used  Substance and Sexual Activity   Alcohol use: No   Drug use: No   Sexual activity: Not Currently    Birth control/protection: None, Post-menopausal    Comment: once in 9 months  Other Topics Concern   Not on file  Social History Narrative   Drinks 3 cups of coffee daily.    Family History:    Family History  Problem Relation Age of Onset   Breast cancer Mother    Cancer Mother 29       Breast then bones and all over   Coronary artery disease Father    Heart disease Father    Stroke Father    Hypertension Sister    Breast cancer Sister 78   Heart disease Sister    Stroke Brother    Coronary artery disease Brother    Coronary artery disease Brother    Coronary artery disease Brother    Colon cancer Neg Hx      ROS:  Please see the history of present illness.   All other ROS reviewed and negative.     Physical Exam/Data: Vitals:   02/10/24 0643 02/10/24 0700 02/10/24 0830 02/10/24 0845  BP: 131/66 (!) 116/41 (!) 161/137   Pulse: (!) 102 96 80 91  Resp: (!) 24 (!) 28 (!) 23 (!) 30  Temp:      TempSrc:      SpO2: 97% 93% (!) 85% 91%  Weight:      Height:        Intake/Output Summary (Last 24 hours) at 02/10/2024 1006 Last data filed at 02/10/2024 0900 Gross per 24 hour  Intake 1276.79 ml  Output 400 ml  Net 876.79 ml      02/09/2024   11:36 PM 02/09/2024    7:48 PM 01/27/2024    1:26 PM  Last 3 Weights  Weight (lbs) 205 lb 7.5 oz 163 lb 2.3 oz 163 lb 1.9 oz  Weight (kg) 93.2 kg 74 kg 73.991 kg     Body mass index is 33.16 kg/m.  General: Not  HEENT: normal Neck: no JVD Vascular: No carotid bruits; Distal pulses 2+ bilaterally Cardiac:  normal S1, S2; RRR; no murmur  Lungs:  clear to auscultation bilaterally, no wheezing, rhonchi or rales  Abd: soft, nontender, no hepatomegaly  Ext: no edema Musculoskeletal:  No deformities, BUE and BLE  strength normal and equal Skin: warm and dry  Neuro:  CNs 2-12 intact, no focal abnormalities noted Psych:  Normal affect   EKG:  The EKG was personally reviewed and demonstrates:  afib, HR 150 with baseline artifact. Not c/w with wide QRS as indicated (vs 07/10/2023: NSR with 06/26/2023 with afib) Telemetry:  Telemetry was personally reviewed and demonstrates:  Afib, HR 80-140 with most recent HR 80-90's   Relevant CV Studies: Event Monitor: 08/2021 ZIO XT reviewed.  5 days, 21 hours analyzed.   Predominant rhythm is sinus with heart rate ranging from 56 bpm up to 109 bpm and average heart rate 69 bpm. Paroxysmal atrial fibrillation was documented, overall rhythm burden of only 4% however.  Average heart rate during events was 98 bpm with limited rapid ventricular response. There were rare PACs including atrial couplets and triplets representing less than 1% total beats. There were rare PVCs representing less than 1% total beats. No pauses.   Echocardiogram: 05/2022 IMPRESSIONS     1. Left ventricular ejection fraction, by estimation, is 65 to 70%. Left  ventricular ejection fraction by PLAX is 68 %. The left ventricle has  normal function. The left ventricle has no regional wall motion  abnormalities. Left ventricular diastolic  parameters are consistent with Grade I diastolic dysfunction (impaired  relaxation).   2. Right ventricular systolic function is low normal. The right  ventricular size is normal.   3. The mitral valve is grossly normal. Trivial mitral valve  regurgitation.   4. The aortic valve is tricuspid. Aortic valve regurgitation is mild.  Aortic valve sclerosis is present, with no evidence of aortic valve  stenosis. Aortic regurgitation PHT measures 504 msec.   5. The inferior vena cava is normal in size with greater than 50%  respiratory variability, suggesting right atrial pressure of 3 mmHg.   Comparison(s): Changes from prior study are noted. 08/27/2021: LVEF  60-65%,  moderate AI.   Laboratory Data: High Sensitivity Troponin:  No results for input(s): TROPONINIHS in the last 720 hours.  Recent Labs  Lab 02/09/24 2046 02/10/24 0056  TRNPT 34* 41*      Chemistry Recent Labs  Lab 02/09/24 2046 02/10/24 0056  NA 139 138  K 3.8 4.0  CL 102 101  CO2 21* 25  GLUCOSE 110* 135*  BUN 33* 32*  CREATININE 0.99 1.07*  CALCIUM  10.9* 10.4*  MG 1.8  --   GFRNONAA >60 56*  ANIONGAP 16* 12    Recent Labs  Lab 02/09/24 2046 02/10/24 0056  PROT 6.0* 5.5*  ALBUMIN 3.2* 3.8  AST 59* 54*  ALT 30 28  ALKPHOS 74 102  BILITOT 0.8 0.8   Lipids No results for input(s): CHOL, TRIG, HDL, LABVLDL, LDLCALC, CHOLHDL in the last 168 hours.  Hematology Recent Labs  Lab 02/09/24 2046 02/10/24 0056  WBC 12.3* 11.2*  RBC 3.80* 3.59*  HGB 10.3* 9.7*  HCT 33.3* 31.6*  MCV 87.6 88.0  MCH 27.1 27.0  MCHC 30.9 30.7  RDW 14.5 14.6  PLT 321 317   Thyroid   Recent Labs  Lab 02/09/24 2046  TSH <0.100*  FREET4 >5.50*    BNPNo results for input(s): BNP, PROBNP in the last 168 hours.  DDimer No results for input(s): DDIMER in the last 168 hours.  Radiology/Studies:  CT Cervical Spine Wo Contrast Result Date: 02/09/2024 EXAM: CT CERVICAL SPINE WITHOUT CONTRAST 02/09/2024 08:47:36 PM TECHNIQUE: CT of the cervical spine was performed without the administration of intravenous contrast. Multiplanar reformatted images are provided for review. Automated exposure control, iterative reconstruction, and/or weight based adjustment of the mA/kV was utilized to reduce the radiation dose to as low as reasonably achievable. COMPARISON: None available. CLINICAL HISTORY: Polytrauma, blunt FINDINGS: BONES AND ALIGNMENT: No acute fracture or traumatic malalignment. DEGENERATIVE CHANGES: No significant degenerative changes. SOFT TISSUES: No prevertebral  soft tissue swelling. IMPRESSION: 1. No significant abnormality. Electronically signed by: Pinkie Pebbles MD 02/09/2024 08:51 PM EST RP Workstation: [id number]   CT Head Wo Contrast Result Date: 02/09/2024 EXAM: CT HEAD WITHOUT CONTRAST 02/09/2024 08:47:36 PM TECHNIQUE: CT of the head was performed without the administration of intravenous contrast. Automated exposure control, iterative reconstruction, and/or weight based adjustment of the mA/kV was utilized to reduce the radiation dose to as low as reasonably achievable. COMPARISON: None available. CLINICAL HISTORY: Polytrauma, blunt. FINDINGS: BRAIN AND VENTRICLES: No acute hemorrhage. No evidence of acute infarct. No hydrocephalus. No extra-axial collection. No mass effect or midline shift. Bilateral basal ganglia mineralization. ORBITS: No acute abnormality. SINUSES: No acute abnormality. SOFT TISSUES AND SKULL: No acute soft tissue abnormality. No skull fracture. VASCULATURE: Atherosclerotic calcifications of carotid siphons and vertebral arteries. IMPRESSION: 1. No acute intracranial abnormality. Electronically signed by: Pinkie Pebbles MD 02/09/2024 08:50 PM EST RP Workstation: [id number]   DG Chest Portable 1 View Result Date: 02/09/2024 EXAM: 1 VIEW XRAY OF THE CHEST 02/09/2024 08:38:55 PM COMPARISON: 03/20/2023 CLINICAL HISTORY: Fall FINDINGS: LUNGS AND PLEURA: No focal pulmonary opacity. No pleural effusion. No pneumothorax. HEART AND MEDIASTINUM: No acute abnormality of the cardiac and mediastinal silhouettes. BONES AND SOFT TISSUES: No acute osseous abnormality. IMPRESSION: 1. No acute process. Electronically signed by: Pinkie Pebbles MD 02/09/2024 08:46 PM EST RP Workstation: [id number]   DG Pelvis Portable Result Date: 02/09/2024 EXAM: 1 Views Xray of the Pelvis 02/09/2024 08:38:55 PM COMPARISON: None available. CLINICAL HISTORY: Fall FINDINGS: BONES AND JOINTS: No acute fracture. No malalignment. Hemisacralization of the right L5 vertebral body. SOFT TISSUES: Unremarkable. IMPRESSION: 1. No evidence of acute traumatic injury.  Electronically signed by: Pinkie Pebbles MD 02/09/2024 08:45 PM EST RP Workstation: [id number]   DG Lumbar Spine Complete Result Date: 02/09/2024 EXAM: 4 Views Xray of the Lumbar Spine 02/09/2024 08:38:55 PM COMPARISON: None available. CLINICAL HISTORY: Fall FINDINGS: LUMBAR SPINE: BONES: Vertebral body heights are maintained. Alignment is normal. Grade 1 anterolisthesis of L4 on L5. DISCS AND DEGENERATIVE CHANGES: Mild degenerative changes of the lower lumbar spine. SOFT TISSUES: No acute abnormality. IMPRESSION: 1. No traumatic injury. 2. Mild degenerative changes of the lower lumbar spine. Electronically signed by: Pinkie Pebbles MD 02/09/2024 08:45 PM EST RP Workstation: [id number]     Assessment and Plan: A-fib with RVR Presented to ED with afib with RVR, HR 150-180's. Treated with IV Cardizem  drip (currently 15 ml/hr) and IV lopressor  x2.  EKG: afib, HR 150 with baseline artifact. Not c/w with wide QRS as indicated (vs 07/10/2023: NSR with 06/26/2023 with afib) Tele: Afib, HR 80-140 with most recent HR 80-90's  K 3.8, Mg 1.8 (keep K between 4-5, and Mg > 2). Order KCL and Mg supplement.  Could be secondary to Leukocytosis, Hyperthyroidism  Continue Eliquis  5 mg twice daily, IV Cardizem  drip.  Recommend restarting home Toprol  XL at lower dose and titrate as tolerated while weaning off Cardizem .  Would not consider for DCCV at the time given her hyperthyroidism and mental status.  Recommend rate control management.   Hypothyroidism/Hyperthyroidism  12/14/2023: TSH < 0.1, free T4 0.61 TSH <0.1.  T3/T4 pending Home PTA includes methimazole , however unclear if compliant with medication. Scheduled to resume Methimazole  5 mg BID.  Followed by primary team.   Leukocytosis Presented with elevated WBC 12.3, tachycardia HR 139, tachypnea 28, afebrile.  Blood culture pending.  Treated with IV abx.  Followed by primary team   Chronic HFpEF Echo 05/2022: EF 65 to 70%, G1  DD No signs of  volume overload via imaging or exam. No need for IV diuresis.    CAD/HLD Cardiac cath 2006: moderate RCA disease TN 34 > 41 EKG with no acute ischemic changes  Not consistent w/ ACS; no need for further ischemic evaluation at this time.   CKD  Baseline Cr 0.9 -1.2  Cr 1.07 Continue to monitor    Risk Assessment/Risk Scores:  CHA2DS2-VASc Score = 3   This indicates a 3.2% annual risk of stroke. The patient's score is based upon: CHF History: 0 HTN History: 0 Diabetes History: 0 Stroke History: 0 Vascular Disease History: 1 Age Score: 1 Gender Score: 1     For questions or updates, please contact [facility] Please consult [email or web address] for contact info under      Signed, Lorette CINDERELLA Kapur, PA-C  02/10/2024 10:06 AM   Attending Note  Patient seen and discussed with PA Kapur, I agree with her documentation. History of PAF, hyper and hypothyroidism over time historically, moderate nonobstructive CAD by 2006 cath, aortic regurgitation, presents with falls at home and AMS. In ER found to be in afib with RVR, cardiology consulted to help manage. Patient with AMS and agitation currently sedated, I am not able to gather information from her.    WBC 12.3 Hgb 10.3 Plt 321 K 3.8 BUN 33 Cr 0.99 TSH undetectable Trop 34-->41   1.PAF - history of afib, presented with afib with RVR - SR 03/2023 EKG, 06/2023 was in afib.  - complicated by hyperthyroidism, TSH undetectable since 11/2023 - started on dilt gtt, now at 15 mg/hr - at home on toprol  100mg  daily - start lopressor  25mg  qid if able to take PO with her AMS, wean dilt gtt as able. If not able to take PO can continue IV lopressor  and dilt gtt, transition to oral regimen when able - she is on eliquis  for stroke prevention   Dorn Ross MD      [1]  Allergies Allergen Reactions   Albuterol    Bactrim [Sulfamethoxazole-Trimethoprim]    Vytorin [Ezetimibe -Simvastatin]     Myalgias    "

## 2024-02-10 NOTE — Progress Notes (Signed)
"  ° °      Overnight   NAME: Debra Graves MRN: [id number] DOB : [birth year]    Date of Service   02/10/2024   HPI/Events of Note    Notified by RN for increased agitation, anxiety, continually trying to get out of bed. Not redirectable.   Brief history 69 year old female history of A-fib on Eliquis , GERD, hypothyroidism, depression, anxiety, CAD and adrenal insufficiency.  Admitted through the ER via EMS for multiple falls. Patient was found to be confused and unable to give coherent history in ER. Patient also complained in ER of lower back pain, noted history of vertebral compression fracture.  Bedside visit Patient is obviously anxious, agitated, orientation is A&O x 1 or 2(fluctuates), she is rambling, continually trying to get out of bed, and yelling out to various names and apparently family members.  Patient does acknowledge lower back pain, patient does not have urinary retention at this time . Of note: Referencing note on 01/27/2024-01/29/2024 at 1320 hrs. Anxiety and panic attacks Lorazepam  adjusted to every 8 hours, resulting in increased nervousness, crying, and shaking.  Patient is noted to have been yelling at staff and family stating that she is not staying here and is leaving, patient was unable to be redirected for patient was yelling that she was going home. Patient was taken to back to her ER room where she refused to get back into bed without staff assistance. She was given Ativan  0.5 mg x 2 doses, 1 dose at 2058 hrs. and a second dose at 2303 hrs. Patient was also given Haldol  1 mg at 2302 hrs.  On arrival to stepdown, patient was given Haldol  1 mg due to aggression, and anxiety.  She is currently requiring a waist belt.   Interventions/ Plan   Avoid Ativan  for the present. Avoid Haldol  for the present Remove waist belt as able. Fall arresting pads in place. Bed alarm active We will try Zyprexa , if needed.      Lynwood Kipper BSN MSNA MSN  ACNPC-AG Acute Care Nurse Practitioner [facility]  "

## 2024-02-11 DIAGNOSIS — I4891 Unspecified atrial fibrillation: Secondary | ICD-10-CM | POA: Diagnosis not present

## 2024-02-11 DIAGNOSIS — E0581 Other thyrotoxicosis with thyrotoxic crisis or storm: Secondary | ICD-10-CM

## 2024-02-11 DIAGNOSIS — G9341 Metabolic encephalopathy: Secondary | ICD-10-CM | POA: Diagnosis not present

## 2024-02-11 LAB — URINE CULTURE: Culture: 20000 — AB

## 2024-02-11 LAB — CBC
HCT: 28.9 % — ABNORMAL LOW (ref 36.0–46.0)
Hemoglobin: 8.8 g/dL — ABNORMAL LOW (ref 12.0–15.0)
MCH: 27.1 pg (ref 26.0–34.0)
MCHC: 30.4 g/dL (ref 30.0–36.0)
MCV: 88.9 fL (ref 80.0–100.0)
Platelets: 294 K/uL (ref 150–400)
RBC: 3.25 MIL/uL — ABNORMAL LOW (ref 3.87–5.11)
RDW: 14.7 % (ref 11.5–15.5)
WBC: 11.9 K/uL — ABNORMAL HIGH (ref 4.0–10.5)
nRBC: 0 % (ref 0.0–0.2)

## 2024-02-11 LAB — BASIC METABOLIC PANEL WITH GFR
Anion gap: 12 (ref 5–15)
BUN: 47 mg/dL — ABNORMAL HIGH (ref 8–23)
CO2: 23 mmol/L (ref 22–32)
Calcium: 10.4 mg/dL — ABNORMAL HIGH (ref 8.9–10.3)
Chloride: 104 mmol/L (ref 98–111)
Creatinine, Ser: 1.28 mg/dL — ABNORMAL HIGH (ref 0.44–1.00)
GFR, Estimated: 45 mL/min — ABNORMAL LOW
Glucose, Bld: 144 mg/dL — ABNORMAL HIGH (ref 70–99)
Potassium: 5.1 mmol/L (ref 3.5–5.1)
Sodium: 138 mmol/L (ref 135–145)

## 2024-02-11 LAB — MAGNESIUM: Magnesium: 2.6 mg/dL — ABNORMAL HIGH (ref 1.7–2.4)

## 2024-02-11 LAB — T3, FREE: T3, Free: 31.9 pg/mL — ABNORMAL HIGH (ref 2.0–4.4)

## 2024-02-11 LAB — AMMONIA: Ammonia: <13 umol/L (ref 9–35)

## 2024-02-11 LAB — PROCALCITONIN: Procalcitonin: 0.16 ng/mL

## 2024-02-11 MED ORDER — METHIMAZOLE 5 MG PO TABS
20.0000 mg | ORAL_TABLET | Freq: Three times a day (TID) | ORAL | Status: DC
  Administered 2024-02-11 – 2024-02-12 (×4): 20 mg via ORAL
  Filled 2024-02-11 (×4): qty 4

## 2024-02-11 MED ORDER — LORAZEPAM 2 MG/ML IJ SOLN
0.5000 mg | Freq: Once | INTRAMUSCULAR | Status: AC
  Administered 2024-02-11: 0.5 mg via INTRAVENOUS
  Filled 2024-02-11: qty 1

## 2024-02-11 MED ORDER — HALOPERIDOL LACTATE 5 MG/ML IJ SOLN
2.0000 mg | Freq: Once | INTRAMUSCULAR | Status: AC
  Administered 2024-02-11: 2 mg via INTRAVENOUS
  Filled 2024-02-11: qty 1

## 2024-02-11 MED ORDER — HYDROCORTISONE SOD SUC (PF) 100 MG IJ SOLR
100.0000 mg | Freq: Three times a day (TID) | INTRAMUSCULAR | Status: DC
  Administered 2024-02-11 – 2024-02-13 (×8): 100 mg via INTRAVENOUS
  Filled 2024-02-11 (×8): qty 2

## 2024-02-11 MED ORDER — POTASSIUM IODIDE (EXPECTORANT) 1 GM/ML PO SOLN
200.0000 mg | Freq: Four times a day (QID) | ORAL | Status: DC
  Administered 2024-02-11 – 2024-02-12 (×5): 200 mg via ORAL
  Filled 2024-02-11 (×10): qty 0.2

## 2024-02-11 MED ORDER — METOPROLOL TARTRATE 25 MG PO TABS
25.0000 mg | ORAL_TABLET | Freq: Four times a day (QID) | ORAL | Status: DC
  Administered 2024-02-11 – 2024-02-12 (×7): 25 mg via ORAL
  Filled 2024-02-11 (×8): qty 1

## 2024-02-11 NOTE — Plan of Care (Signed)
" °  Problem: Acute Rehab OT Goals (only OT should resolve) Goal: Pt. Will Perform Grooming Flowsheets (Taken 02/11/2024 0920) Pt Will Perform Grooming:  with contact guard assist  standing Goal: Pt. Will Perform Upper Body Dressing Flowsheets (Taken 02/11/2024 0920) Pt Will Perform Upper Body Dressing: with modified independence Goal: Pt. Will Perform Lower Body Dressing Flowsheets (Taken 02/11/2024 0920) Pt Will Perform Lower Body Dressing: with modified independence Goal: Pt. Will Transfer To Toilet Flowsheets (Taken 02/11/2024 0920) Pt Will Transfer to Toilet:  with supervision  stand pivot transfer  ambulating Goal: Pt. Will Perform Toileting-Clothing Manipulation Flowsheets (Taken 02/11/2024 0920) Pt Will Perform Toileting - Clothing Manipulation and hygiene: with modified independence Goal: Pt/Caregiver Will Perform Home Exercise Program Flowsheets (Taken 02/11/2024 0920) Pt/caregiver will Perform Home Exercise Program:  Increased strength  Increased ROM  Independently  Both right and left upper extremity  Emmalena Canny OT, MOT  "

## 2024-02-11 NOTE — TOC PASRR Note (Signed)
 DOB: 2055/10/12 Date: 02/11/24   Must ID: [id number]   To Whom it May Concern:  Please be advised that the above named patient will require a short-term nursing home stay- anticipated 30 days or less rehabilitation and strengthening. The plan is for return home.

## 2024-02-11 NOTE — Plan of Care (Signed)
" °  Problem: Acute Rehab PT Goals(only PT should resolve) Goal: Pt Will Go Supine/Side To Sit Outcome: Progressing Flowsheets (Taken 02/11/2024 1206) Pt will go Supine/Side to Sit:  with minimal assist  with moderate assist Goal: Patient Will Transfer Sit To/From Stand Outcome: Progressing Flowsheets (Taken 02/11/2024 1206) Patient will transfer sit to/from stand:  with minimal assist  with moderate assist Goal: Pt Will Transfer Bed To Chair/Chair To Bed Outcome: Progressing Flowsheets (Taken 02/11/2024 1206) Pt will Transfer Bed to Chair/Chair to Bed:  with min assist  with mod assist Goal: Pt Will Ambulate Outcome: Progressing Flowsheets (Taken 02/11/2024 1206) Pt will Ambulate:  25 feet  with minimal assist  with moderate assist  with rolling walker   12:06 PM, 02/11/24 Lynwood Music, MPT Physical Therapist with [facility] [phone] office [phone] mobile phone  "

## 2024-02-11 NOTE — Evaluation (Signed)
 Occupational Therapy Evaluation Patient Details Name: Debra Graves MRN: [id number] DOB: Aug 21, 1955 Today's Date: 02/11/2024   History of Present Illness   Debra Graves is a 69 y.o. female with medical history significant for paroxysmal A-fib on Eliquis , hypothyroidism on methimazole , hypertension, hyperlipidemia, coronary artery disease, chronic anxiety disorder, who presents to the ER due to multiple falls at home today.  Associated with altered mental status for the past 2 days and worsening today.  It is unclear whether or not the patient has been compliant with her home medications.  No reported subjective fevers or chills. (per MD)     Clinical Impressions Pt impulsive but agreeable to OT and PT co-evaluation. Pt is a poor historian at this time but was able to report being independent for mobility and ADL's at baseline. Today pt required mod to max A for bed mobility and functional transfer with RW. Pt is very unsteady in standing and demonstrates need for mod to max A for lower body ADL's and set up to min A for many upper body ADL's. Pt is generally weak in B UE with limited shoulder A/ROM but full P/ROM for shoulder flexion. Pt left in the chair with sitter present and call bell within reach. Pt will benefit from continued OT in the hospital to increase strength, balance, and endurance for safe ADL's.        If plan is discharge home, recommend the following:   A lot of help with walking and/or transfers;A lot of help with bathing/dressing/bathroom;Assistance with cooking/housework;Assist for transportation;Help with stairs or ramp for entrance;Direct supervision/assist for medications management;Supervision due to cognitive status     Functional Status Assessment   Patient has had a recent decline in their functional status and demonstrates the ability to make significant improvements in function in a reasonable and predictable amount of time.     Equipment  Recommendations   None recommended by OT             Precautions/Restrictions   Precautions Precautions: Fall Recall of Precautions/Restrictions: Impaired Restrictions Weight Bearing Restrictions Per Provider Order: No     Mobility Bed Mobility Overal bed mobility: Needs Assistance Bed Mobility: Supine to Sit     Supine to sit: Mod assist, Max assist     General bed mobility comments: Assist to move B LE to EOB and to pull to sit.    Transfers Overall transfer level: Needs assistance   Transfers: Sit to/from Stand, Bed to chair/wheelchair/BSC Sit to Stand: Mod assist     Step pivot transfers: Mod assist, Max assist     General transfer comment: EOB to chair without AD.      Balance Overall balance assessment: Needs assistance Sitting-balance support: No upper extremity supported, Feet supported Sitting balance-Leahy Scale: Poor Sitting balance - Comments: poor to fair seated at EOB.   Standing balance support: Bilateral upper extremity supported, During functional activity, Reliant on assistive device for balance Standing balance-Leahy Scale: Poor Standing balance comment: Poor with and without AD                           ADL either performed or assessed with clinical judgement   ADL Overall ADL's : Needs assistance/impaired Eating/Feeding: Set up;Sitting   Grooming: Set up;Minimal assistance;Sitting   Upper Body Bathing: Set up;Minimal assistance;Sitting   Lower Body Bathing: Moderate assistance;Maximal assistance;Sitting/lateral leans   Upper Body Dressing : Set up;Minimal assistance;Sitting   Lower Body Dressing: Moderate assistance;Maximal  assistance;Sitting/lateral leans Lower Body Dressing Details (indicate cue type and reason): Able to doff sock seated in chair, but unable to don. Toilet Transfer: Moderate assistance;Maximal assistance;Stand-pivot;Rolling walker (2 wheels);BSC/3in1 Toilet Transfer Details (indicate cue type  and reason): Chair to [facility] with RW Toileting- Clothing Manipulation and Hygiene: Maximal assistance;Sitting/lateral lean;Sit to/from stand       Functional mobility during ADLs: Moderate assistance;Maximal assistance;Rolling walker (2 wheels)       Vision Baseline Vision/History: 1 Wears glasses Ability to See in Adequate Light: 0 Adequate Patient Visual Report: No change from baseline Vision Assessment?: Wears glasses for reading     Perception Perception: Not tested       Praxis Praxis: Not tested       Pertinent Vitals/Pain Pain Assessment Pain Assessment: Faces Faces Pain Scale: No hurt     Extremity/Trunk Assessment Upper Extremity Assessment Upper Extremity Assessment: Generalized weakness (3-/5 shoulder flexion bilaterally but full P/ROM. Pt noted to have tremors/shaking throughout the session. Generally weak otherwise.)   Lower Extremity Assessment Lower Extremity Assessment: Defer to PT evaluation   Cervical / Trunk Assessment Cervical / Trunk Assessment: Kyphotic   Communication Communication Communication: Impaired Factors Affecting Communication: Difficulty expressing self   Cognition Arousal: Alert Behavior During Therapy: Impulsive Cognition: Cognition impaired, No family/caregiver present to determine baseline   Orientation impairments: Time, Situation         OT - Cognition Comments: Pt new the year, place, name, but did not know the  month or situtaion. Pt struggled to give home living and prior function history.                 Following commands: Intact       Cueing  General Comments   Cueing Techniques: Tactile cues;Verbal cues;Gestural cues  Pt removed from supplemental O2 with saturation >90% SpO2 the entire session. Pt left on room air.              Home Living Family/patient expects to be discharged to:: Private residence     Type of Home: House (double-wide)                       Home Equipment: None    Additional Comments: Unable to gather much history from patient due to current cognitive status.      Prior Functioning/Environment Prior Level of Function : History of Falls (last six months);Patient poor historian/Family not available             Mobility Comments: Pt reports no use of AD for ambulation. ADLs Comments: Pt reports independence.    OT Problem List: Decreased strength;Decreased range of motion;Impaired balance (sitting and/or standing);Decreased activity tolerance;Decreased cognition;Decreased coordination;Decreased safety awareness;Decreased knowledge of use of DME or AE   OT Treatment/Interventions: Self-care/ADL training;Therapeutic exercise;Therapeutic activities;DME and/or AE instruction;Cognitive remediation/compensation;Patient/family education;Balance training      OT Goals(Current goals can be found in the care plan section)   Acute Rehab OT Goals Patient Stated Goal: Improve function. OT Goal Formulation: With patient Time For Goal Achievement: 02/25/24 Potential to Achieve Goals: Good   OT Frequency:  Min 2X/week    Co-evaluation PT/OT/SLP Co-Evaluation/Treatment: Yes Reason for Co-Treatment: To address functional/ADL transfers   OT goals addressed during session: ADL's and self-care                       End of Session Equipment Utilized During Treatment: Rolling walker (2 wheels);Gait belt  Activity Tolerance: Patient tolerated treatment  well Patient left: in chair;with call bell/phone within reach;with nursing/sitter in room  OT Visit Diagnosis: Unsteadiness on feet (R26.81);Other abnormalities of gait and mobility (R26.89);Muscle weakness (generalized) (M62.81);History of falling (Z91.81);Other symptoms and signs involving cognitive function                Time: 9191-9171 OT Time Calculation (min): 20 min Charges:  OT General Charges $OT Visit: 1 Visit OT Evaluation $OT Eval Low Complexity: 1 Low Mycah Formica OT,  MOT  Jayson Person 02/11/2024, 9:17 AM

## 2024-02-11 NOTE — Progress Notes (Signed)
 "  Rounding Note   Patient Name: Debra Graves Date of Encounter: 02/11/2024  [facility] Cardiologist: Jayson Sierras, MD   Subjective Much more alert today. Has had some palpitaitons  Scheduled Meds:  apixaban   5 mg Oral BID   Chlorhexidine  Gluconate Cloth  6 each Topical Daily   DULoxetine   60 mg Oral Daily   ezetimibe   10 mg Oral Daily   hydrocortisone  sod succinate (SOLU-CORTEF ) inj  100 mg Intravenous TID   lidocaine   1 patch Transdermal Q24H   methimazole   20 mg Oral TID   metoprolol  tartrate  5 mg Intravenous Q6H   nicotine   7 mg Transdermal Daily   potassium chloride   40 mEq Oral BID   potassium iodide   200 mg Oral QID   rosuvastatin   40 mg Oral Daily   Continuous Infusions:  cefTRIAXone  (ROCEPHIN )  IV Stopped (02/10/24 1059)   diltiazem  (CARDIZEM ) infusion 5 mg/hr (02/11/24 0801)   PRN Meds: acetaminophen , LORazepam , melatonin, mouth rinse, polyethylene glycol, prochlorperazine    Vital Signs  Vitals:   02/11/24 0000 02/11/24 0700 02/11/24 0745 02/11/24 0800  BP: (!) 140/74 (!) 179/46  (!) 159/115  Pulse:  95  95  Resp: (!) 26 (!) 26  18  Temp: 97.8 F (36.6 C)  (!) 97 F (36.1 C)   TempSrc: Axillary  Axillary   SpO2:  95%  98%  Weight:      Height:        Intake/Output Summary (Last 24 hours) at 02/11/2024 0819 Last data filed at 02/11/2024 0801 Gross per 24 hour  Intake 1479.49 ml  Output 650 ml  Net 829.49 ml      02/09/2024   11:36 PM 02/09/2024    7:48 PM 01/27/2024    1:26 PM  Last 3 Weights  Weight (lbs) 205 lb 7.5 oz 163 lb 2.3 oz 163 lb 1.9 oz  Weight (kg) 93.2 kg 74 kg 73.991 kg      Telemetry Rate controlled afib - Personally Reviewed  ECG  N/a - Personally Reviewed  Physical Exam  GEN: No acute distress.   Neck: No JVD Cardiac:irreg Respiratory: Clear to auscultation bilaterally. GI: Soft, nontender, non-distended  MS: No edema; No deformity. Neuro:  Nonfocal  Psych: Normal affect   Labs High Sensitivity  Troponin:  No results for input(s): TROPONINIHS in the last 720 hours.  Recent Labs  Lab 02/09/24 2046 02/10/24 0056  TRNPT 34* 41*       Chemistry Recent Labs  Lab 02/09/24 2046 02/10/24 0056 02/11/24 0454  NA 139 138 138  K 3.8 4.0 5.1  CL 102 101 104  CO2 21* 25 23  GLUCOSE 110* 135* 144*  BUN 33* 32* 47*  CREATININE 0.99 1.07* 1.28*  CALCIUM  10.9* 10.4* 10.4*  MG 1.8  --  2.6*  PROT 6.0* 5.5*  --   ALBUMIN 3.2* 3.8  --   AST 59* 54*  --   ALT 30 28  --   ALKPHOS 74 102  --   BILITOT 0.8 0.8  --   GFRNONAA >60 56* 45*  ANIONGAP 16* 12 12    Lipids No results for input(s): CHOL, TRIG, HDL, LABVLDL, LDLCALC, CHOLHDL in the last 168 hours.  Hematology Recent Labs  Lab 02/09/24 2046 02/10/24 0056 02/11/24 0454  WBC 12.3* 11.2* 11.9*  RBC 3.80* 3.59* 3.25*  HGB 10.3* 9.7* 8.8*  HCT 33.3* 31.6* 28.9*  MCV 87.6 88.0 88.9  MCH 27.1 27.0 27.1  MCHC 30.9 30.7  30.4  RDW 14.5 14.6 14.7  PLT 321 317 294   Thyroid   Recent Labs  Lab 02/09/24 2046  TSH <0.100*  FREET4 >5.50*    BNPNo results for input(s): BNP, PROBNP in the last 168 hours.  DDimer No results for input(s): DDIMER in the last 168 hours.   Radiology  CT Cervical Spine Wo Contrast Result Date: 02/09/2024 EXAM: CT CERVICAL SPINE WITHOUT CONTRAST 02/09/2024 08:47:36 PM TECHNIQUE: CT of the cervical spine was performed without the administration of intravenous contrast. Multiplanar reformatted images are provided for review. Automated exposure control, iterative reconstruction, and/or weight based adjustment of the mA/kV was utilized to reduce the radiation dose to as low as reasonably achievable. COMPARISON: None available. CLINICAL HISTORY: Polytrauma, blunt FINDINGS: BONES AND ALIGNMENT: No acute fracture or traumatic malalignment. DEGENERATIVE CHANGES: No significant degenerative changes. SOFT TISSUES: No prevertebral soft tissue swelling. IMPRESSION: 1. No significant abnormality.  Electronically signed by: Pinkie Pebbles MD 02/09/2024 08:51 PM EST RP Workstation: [id number]   CT Head Wo Contrast Result Date: 02/09/2024 EXAM: CT HEAD WITHOUT CONTRAST 02/09/2024 08:47:36 PM TECHNIQUE: CT of the head was performed without the administration of intravenous contrast. Automated exposure control, iterative reconstruction, and/or weight based adjustment of the mA/kV was utilized to reduce the radiation dose to as low as reasonably achievable. COMPARISON: None available. CLINICAL HISTORY: Polytrauma, blunt. FINDINGS: BRAIN AND VENTRICLES: No acute hemorrhage. No evidence of acute infarct. No hydrocephalus. No extra-axial collection. No mass effect or midline shift. Bilateral basal ganglia mineralization. ORBITS: No acute abnormality. SINUSES: No acute abnormality. SOFT TISSUES AND SKULL: No acute soft tissue abnormality. No skull fracture. VASCULATURE: Atherosclerotic calcifications of carotid siphons and vertebral arteries. IMPRESSION: 1. No acute intracranial abnormality. Electronically signed by: Pinkie Pebbles MD 02/09/2024 08:50 PM EST RP Workstation: [id number]   DG Chest Portable 1 View Result Date: 02/09/2024 EXAM: 1 VIEW XRAY OF THE CHEST 02/09/2024 08:38:55 PM COMPARISON: 03/20/2023 CLINICAL HISTORY: Fall FINDINGS: LUNGS AND PLEURA: No focal pulmonary opacity. No pleural effusion. No pneumothorax. HEART AND MEDIASTINUM: No acute abnormality of the cardiac and mediastinal silhouettes. BONES AND SOFT TISSUES: No acute osseous abnormality. IMPRESSION: 1. No acute process. Electronically signed by: Pinkie Pebbles MD 02/09/2024 08:46 PM EST RP Workstation: [id number]   DG Pelvis Portable Result Date: 02/09/2024 EXAM: 1 Views Xray of the Pelvis 02/09/2024 08:38:55 PM COMPARISON: None available. CLINICAL HISTORY: Fall FINDINGS: BONES AND JOINTS: No acute fracture. No malalignment. Hemisacralization of the right L5 vertebral body. SOFT TISSUES: Unremarkable. IMPRESSION: 1. No evidence  of acute traumatic injury. Electronically signed by: Pinkie Pebbles MD 02/09/2024 08:45 PM EST RP Workstation: [id number]   DG Lumbar Spine Complete Result Date: 02/09/2024 EXAM: 4 Views Xray of the Lumbar Spine 02/09/2024 08:38:55 PM COMPARISON: None available. CLINICAL HISTORY: Fall FINDINGS: LUMBAR SPINE: BONES: Vertebral body heights are maintained. Alignment is normal. Grade 1 anterolisthesis of L4 on L5. DISCS AND DEGENERATIVE CHANGES: Mild degenerative changes of the lower lumbar spine. SOFT TISSUES: No acute abnormality. IMPRESSION: 1. No traumatic injury. 2. Mild degenerative changes of the lower lumbar spine. Electronically signed by: Pinkie Pebbles MD 02/09/2024 08:45 PM EST RP Workstation: [id number]    Cardiac Studies   Patient Profile   Debra Graves is a 69 y.o. female with a hx of CAD, paroxysmal atrial fibrillation, carotid artery plaque, aortic valve insufficiency, HLD, hypothyroidism/hyperthyroidism (follow-up by endocrinology) who is being seen 02/10/2024 for the evaluation of A-fib with RVR at the request of Dr. Evonnie.   Assessment & Plan  1.PAF -  history of afib, presented with afib with RVR - SR 03/2023 EKG, 06/2023 was in afib. Afib on admission - complicated by hyperthyroidism, TSH undetectable since 11/2023, free T4 this admit >5.50 - started on dilt gtt, now at 15 mg/hr and also IV lopressor  - at home on toprol  100mg  daily  - altered yesterday, was not taking PO. This morning mental status much improved. Start oral lopressor  25mg  qid, d/c IV lopressor , wean dilt drip to keep HRs <110 - she is on eliquis  for stroke prevention    2.Chronic HFpEF - Echo 05/2022: EF 65 to 70%, G1 DD No signs of volume overload via imaging or exam   We will follow telemetry tomorrow    For questions or updates, please contact [facility] Please consult [email or web address] for contact info under       Signed, Alvan Carrier, MD  02/11/2024, 8:19 AM    "

## 2024-02-11 NOTE — Evaluation (Signed)
 Physical Therapy Evaluation Patient Details Name: Debra Graves MRN: [id number] DOB: 01-09-1956 Today's Date: 02/11/2024  History of Present Illness  Debra Graves is a 69 y.o. female with medical history significant for paroxysmal A-fib on Eliquis , hypothyroidism on methimazole , hypertension, hyperlipidemia, coronary artery disease, chronic anxiety disorder, who presents to the ER due to multiple falls at home today.  Associated with altered mental status for the past 2 days and worsening today.  It is unclear whether or not the patient has been compliant with her home medications.  No reported subjective fevers or chills.   Clinical Impression  Patient demonstrates slow labored movement for sitting up at bedside, unsteady on feet with near loss of balance during transfer without AD, required RW for safety, but still at risk for falls due to BLE weakness, uncontrollable shaking of extremities and easily fatigues. Patient tolerated sitting up in chair after therapy. Patient will benefit from continued skilled physical therapy in hospital and recommended venue below to increase strength, balance, endurance for safe ADLs and gait.           If plan is discharge home, recommend the following: A lot of help with bathing/dressing/bathroom;A lot of help with walking and/or transfers;Help with stairs or ramp for entrance;Assist for transportation;Assistance with cooking/housework   Can travel by private vehicle   No    Equipment Recommendations Rolling walker (2 wheels)  Recommendations for Other Services       Functional Status Assessment Patient has had a recent decline in their functional status and demonstrates the ability to make significant improvements in function in a reasonable and predictable amount of time.     Precautions / Restrictions Precautions Precautions: Fall Recall of Precautions/Restrictions: Impaired Restrictions Weight Bearing Restrictions Per Provider Order: No       Mobility  Bed Mobility Overal bed mobility: Needs Assistance Bed Mobility: Supine to Sit     Supine to sit: Mod assist, Max assist     General bed mobility comments: slow labored movement    Transfers Overall transfer level: Needs assistance Equipment used: Rolling walker (2 wheels), 1 person hand held assist, None Transfers: Sit to/from Stand, Bed to chair/wheelchair/BSC Sit to Stand: Mod assist   Step pivot transfers: Mod assist, Max assist       General transfer comment: patient very unteady on feet with near loss of balance without AD, safer using RW    Ambulation/Gait Ambulation/Gait assistance: Mod assist, Max assist Gait Distance (Feet): 5 Feet Assistive device: Rollator (4 wheels) Gait Pattern/deviations: Decreased step length - right, Decreased step length - left, Decreased stride length, Trunk flexed, Knees buckling Gait velocity: slow     General Gait Details: unsteady labored movement with frequent buckling knees due to weakness and uncontrollable shaking of extremities  Stairs            Wheelchair Mobility     Tilt Bed    Modified Rankin (Stroke Patients Only)       Balance Overall balance assessment: Needs assistance Sitting-balance support: Feet supported, No upper extremity supported Sitting balance-Leahy Scale: Poor Sitting balance - Comments: fair/poor seated at EOB   Standing balance support: Bilateral upper extremity supported, During functional activity, Reliant on assistive device for balance Standing balance-Leahy Scale: Poor Standing balance comment: Poor with and without AD                             Pertinent Vitals/Pain Pain Assessment Pain Assessment:  No/denies pain    Home Living Family/patient expects to be discharged to:: Private residence     Type of Home: House           Home Equipment: None Additional Comments: Unable to gather much history from patient due to current cognitive  status.    Prior Function Prior Level of Function : History of Falls (last six months);Patient poor historian/Family not available             Mobility Comments: Pt reports no use of AD for ambulation. ADLs Comments: Pt reports independence.     Extremity/Trunk Assessment   Upper Extremity Assessment Upper Extremity Assessment: Defer to OT evaluation    Lower Extremity Assessment Lower Extremity Assessment: Generalized weakness    Cervical / Trunk Assessment Cervical / Trunk Assessment: Kyphotic  Communication   Communication Communication: Impaired Factors Affecting Communication: Difficulty expressing self    Cognition Arousal: Alert Behavior During Therapy: Impulsive, Anxious                             Following commands: Intact       Cueing Cueing Techniques: Tactile cues, Verbal cues, Gestural cues     General Comments General comments (skin integrity, edema, etc.): Pt removed from supplemental O2 with saturation >90% SpO2 the entire session. Pt left on room air.    Exercises     Assessment/Plan    PT Assessment Patient needs continued PT services  PT Problem List Decreased strength;Decreased activity tolerance;Decreased balance;Decreased mobility       PT Treatment Interventions DME instruction;Gait training;Stair training;Functional mobility training;Therapeutic activities;Therapeutic exercise;Balance training;Patient/family education    PT Goals (Current goals can be found in the Care Plan section)  Acute Rehab PT Goals Patient Stated Goal: return  home PT Goal Formulation: With patient Time For Goal Achievement: 02/25/24 Potential to Achieve Goals: Good    Frequency Min 3X/week     Co-evaluation PT/OT/SLP Co-Evaluation/Treatment: Yes Reason for Co-Treatment: To address functional/ADL transfers PT goals addressed during session: Mobility/safety with mobility;Balance;Proper use of DME OT goals addressed during session: ADL's  and self-care       AM-PAC PT 6 Clicks Mobility  Outcome Measure Help needed turning from your back to your side while in a flat bed without using bedrails?: A Little Help needed moving from lying on your back to sitting on the side of a flat bed without using bedrails?: A Lot Help needed moving to and from a bed to a chair (including a wheelchair)?: A Lot Help needed standing up from a chair using your arms (e.g., wheelchair or bedside chair)?: A Lot Help needed to walk in hospital room?: A Lot Help needed climbing 3-5 steps with a railing? : Total 6 Click Score: 12    End of Session   Activity Tolerance: Patient tolerated treatment well;Patient limited by fatigue Patient left: in chair;with call bell/phone within reach Nurse Communication: Mobility status PT Visit Diagnosis: Unsteadiness on feet (R26.81);Other abnormalities of gait and mobility (R26.89);Muscle weakness (generalized) (M62.81)    Time: 9198-9171 PT Time Calculation (min) (ACUTE ONLY): 27 min   Charges:     PT Treatments $Therapeutic Activity: 23-37 mins PT General Charges $$ ACUTE PT VISIT: 1 Visit         12:05 PM, 02/11/24 Lynwood Music, MPT Physical Therapist with [facility] [phone] office [phone] mobile phone

## 2024-02-11 NOTE — Progress Notes (Addendum)
 "          PROGRESS NOTE  Debra Graves FMW:[id number] DOB: Nov 26, 1955 DOA: 03/10/2024 PCP: Bevely Doffing, FNP  Brief History:  69 year old female with a history of paroxysmal atrial fibrillation on apixaban , hyperthyroidism, hypertension, hyperlipidemia, coronary disease, anxiety presenting with multiple falls at home. The patient was confused at the time of presentation and was unable to provide any history.  In the ED, the patient was tachycardic with atrial fibrillation with RVR with heart rates of 150s.  The patient was afebrile and hemodynamically stable.  She was started on diltiazem  drip.  The patient's HR gradually improved with the diltiazem  drip and metoprolol .  She remained confused intermittently agitated. As result, more aggressive treatment for thyrotoxicosis was started.   Assessment/Plan: Atrial fibrillation with RVR -Continue diltiazem  drip>>weaned off - Restart metoprolol  succinate (IV lopressor  if not tolerating po) - Continue apixaban    Thyrotoxicosis - 12/14/2023 TSH <0.100 -12/14/2023 free T4---0.61 -12/14/2023 thyroid  functions were improving with regard to her hyperthyroidism when compared to prior studies - 2024-03-10 TSH <0.100 - March 10, 2024 Free T4 >5.50 - Increase methimazole  to 20 mg TID - Increase Solu-Cortef  to 100 mg TID - start SSKI  200 mg every 6 hours   Acute metabolic encephalopathy - 02/11/24--more alert, but remains intermittently agitated - B12--666 - Folic acid --10.2 - Ammonia <13 - VBG 7.39/43/43/26 - CT brain - UA 11-20 WBC>> send urine culture - Send urine culture -start empiric ceftriaxone  - PDMP reviewed-- -Ativan  1 mg, #90, last refill 01/27/2024 - Klonopin  0.5 mg, #30, last refill 01/27/2024   Leukemoid reaction - Likely stress demargination - Start empiric ceftriaxone  - Follow urine and blood cultures - Patient is afebrile and hemodynamically stable   Chronic HFpEF - 06/13/2022 echo EF 65 to 70%, grade 1 DD   Anxiety -  Patient is on baseline Ativan  and Klonopin  at home - PDMP reviewed as discussed above   Generalized weakness and multiple falls - PT evaluation once patient stable   CKD stage III A - Baseline creatinine 0.9-1.2 - Monitor BMP         Family Communication:  no Family at bedside   Consultants:  cardiology   Code Status:  FULL    DVT Prophylaxis:  apixaban      Procedures: As Listed in Progress Note Above   Antibiotics: Ceftriaxone  1/21>>       Subjective: Patient is more alert and awake but remains intermittently agitated.  She denies any chest pain or shortness of breath.  Review of systems is limited secondary to confusion.  Objective: Vitals:   02/10/24 1715 02/10/24 2000 02/11/24 0000 02/11/24 0745  BP: (!) 103/58 121/77 (!) 140/74   Pulse:  (!) 102    Resp: (!) 28 (!) 21 (!) 26   Temp:  98.2 F (36.8 C) 97.8 F (36.6 C) (!) 97 F (36.1 C)  TempSrc:  Axillary Axillary Axillary  SpO2:  96%    Weight:      Height:        Intake/Output Summary (Last 24 hours) at 02/11/2024 0800 Last data filed at 02/10/2024 2308 Gross per 24 hour  Intake 1435.07 ml  Output 650 ml  Net 785.07 ml   Weight change:  Exam:  General:  Pt is alert, intermittently follows commands appropriately, not in acute distress HEENT: No icterus, No thrush, No neck mass, [place]/AT Cardiovascular: IRRR, S1/S2, no rubs, no gallops Respiratory: CTA bilaterally, no wheezing, no crackles, no rhonchi Abdomen: Soft/+BS, non tender, non distended, no guarding Extremities:  Non pitting edema, No lymphangitis, No petechiae, No rashes, no synovitis   Data Reviewed: I have personally reviewed following labs and imaging studies Basic Metabolic Panel: Recent Labs  Lab 02/09/24 2046 02/10/24 0056 02/11/24 0454  NA 139 138 138  K 3.8 4.0 5.1  CL 102 101 104  CO2 21* 25 23  GLUCOSE 110* 135* 144*  BUN 33* 32* 47*  CREATININE 0.99 1.07* 1.28*  CALCIUM  10.9* 10.4* 10.4*  MG 1.8  --  2.6*  PHOS   --  5.0*  --    Liver Function Tests: Recent Labs  Lab 02/09/24 2046 02/10/24 0056  AST 59* 54*  ALT 30 28  ALKPHOS 74 102  BILITOT 0.8 0.8  PROT 6.0* 5.5*  ALBUMIN 3.2* 3.8   No results for input(s): LIPASE, AMYLASE in the last 168 hours. Recent Labs  Lab 02/11/24 0454  AMMONIA <13   Coagulation Profile: No results for input(s): INR, PROTIME in the last 168 hours. CBC: Recent Labs  Lab 02/09/24 2046 02/10/24 0056 02/11/24 0454  WBC 12.3* 11.2* 11.9*  NEUTROABS 8.5* 7.5  --   HGB 10.3* 9.7* 8.8*  HCT 33.3* 31.6* 28.9*  MCV 87.6 88.0 88.9  PLT 321 317 294   Cardiac Enzymes: Recent Labs  Lab 02/09/24 2046  CKTOTAL 72   BNP: Invalid input(s): POCBNP CBG: Recent Labs  Lab 02/09/24 2056  GLUCAP 108*   HbA1C: No results for input(s): HGBA1C in the last 72 hours. Urine analysis:    Component Value Date/Time   COLORURINE YELLOW 02/10/2024 0705   APPEARANCEUR CLOUDY (A) 02/10/2024 0705   LABSPEC 1.017 02/10/2024 0705   PHURINE 5.0 02/10/2024 0705   GLUCOSEU NEGATIVE 02/10/2024 0705   HGBUR NEGATIVE 02/10/2024 0705   BILIRUBINUR NEGATIVE 02/10/2024 0705   BILIRUBINUR small (A) 12/16/2022 1620   KETONESUR NEGATIVE 02/10/2024 0705   PROTEINUR 30 (A) 02/10/2024 0705   UROBILINOGEN 0.2 12/16/2022 1620   NITRITE NEGATIVE 02/10/2024 0705   LEUKOCYTESUR TRACE (A) 02/10/2024 0705   Sepsis Labs: @LABRCNTIP (procalcitonin:4,lacticidven:4) ) Recent Results (from the past 240 hours)  Culture, blood (Routine X 2) w Reflex to ID Panel     Status: None (Preliminary result)   Collection Time: 02/10/24 12:56 AM   Specimen: BLOOD  Result Value Ref Range Status   Specimen Description BLOOD BLOOD LEFT ARM  Final   Special Requests   Final    BOTTLES DRAWN AEROBIC AND ANAEROBIC Blood Culture adequate volume   Culture   Final    NO GROWTH 1 DAY Performed at [facility], [address], [place], [place] [address]    Report Status PENDING  Incomplete   Culture, blood (Routine X 2) w Reflex to ID Panel     Status: None (Preliminary result)   Collection Time: 02/10/24 12:56 AM   Specimen: BLOOD  Result Value Ref Range Status   Specimen Description BLOOD BLOOD LEFT ARM  Final   Special Requests   Final    BOTTLES DRAWN AEROBIC AND ANAEROBIC Blood Culture adequate volume   Culture   Final    NO GROWTH 1 DAY Performed at [facility], [address], [place], [place] [address]    Report Status PENDING  Incomplete  MRSA Next Gen by PCR, Nasal     Status: None   Collection Time: 02/10/24 11:10 AM   Specimen: Nasal Mucosa; Nasal Swab  Result Value Ref Range Status   MRSA by PCR Next Gen NOT DETECTED NOT DETECTED Final    Comment: (  NOTE) The GeneXpert MRSA Assay (FDA approved for NASAL specimens only), is one component of a comprehensive MRSA colonization surveillance program. It is not intended to diagnose MRSA infection nor to guide or monitor treatment for MRSA infections. Test performance is not FDA approved in patients less than 54 years old. Performed at [facility], [address], [place], [place] [address]      Scheduled Meds:  apixaban   5 mg Oral BID   Chlorhexidine  Gluconate Cloth  6 each Topical Daily   DULoxetine   60 mg Oral Daily   ezetimibe   10 mg Oral Daily   hydrocortisone  sod succinate (SOLU-CORTEF ) inj  100 mg Intravenous TID   lidocaine   1 patch Transdermal Q24H   methimazole   20 mg Oral TID   metoprolol  tartrate  5 mg Intravenous Q6H   nicotine   7 mg Transdermal Daily   potassium chloride   40 mEq Oral BID   potassium iodide   200 mg Oral QID   rosuvastatin   40 mg Oral Daily   Continuous Infusions:  cefTRIAXone  (ROCEPHIN )  IV Stopped (02/10/24 1059)   diltiazem  (CARDIZEM ) infusion 5 mg/hr (02/10/24 2308)    Procedures/Studies: CT Cervical Spine Wo Contrast Result Date: 02/09/2024 EXAM: CT CERVICAL SPINE WITHOUT CONTRAST 02/09/2024 08:47:36 PM TECHNIQUE: CT of the cervical spine was performed without the  administration of intravenous contrast. Multiplanar reformatted images are provided for review. Automated exposure control, iterative reconstruction, and/or weight based adjustment of the mA/kV was utilized to reduce the radiation dose to as low as reasonably achievable. COMPARISON: None available. CLINICAL HISTORY: Polytrauma, blunt FINDINGS: BONES AND ALIGNMENT: No acute fracture or traumatic malalignment. DEGENERATIVE CHANGES: No significant degenerative changes. SOFT TISSUES: No prevertebral soft tissue swelling. IMPRESSION: 1. No significant abnormality. Electronically signed by: Pinkie Pebbles MD 02/09/2024 08:51 PM EST RP Workstation: [id number]   CT Head Wo Contrast Result Date: 02/09/2024 EXAM: CT HEAD WITHOUT CONTRAST 02/09/2024 08:47:36 PM TECHNIQUE: CT of the head was performed without the administration of intravenous contrast. Automated exposure control, iterative reconstruction, and/or weight based adjustment of the mA/kV was utilized to reduce the radiation dose to as low as reasonably achievable. COMPARISON: None available. CLINICAL HISTORY: Polytrauma, blunt. FINDINGS: BRAIN AND VENTRICLES: No acute hemorrhage. No evidence of acute infarct. No hydrocephalus. No extra-axial collection. No mass effect or midline shift. Bilateral basal ganglia mineralization. ORBITS: No acute abnormality. SINUSES: No acute abnormality. SOFT TISSUES AND SKULL: No acute soft tissue abnormality. No skull fracture. VASCULATURE: Atherosclerotic calcifications of carotid siphons and vertebral arteries. IMPRESSION: 1. No acute intracranial abnormality. Electronically signed by: Pinkie Pebbles MD 02/09/2024 08:50 PM EST RP Workstation: [id number]   DG Chest Portable 1 View Result Date: 02/09/2024 EXAM: 1 VIEW XRAY OF THE CHEST 02/09/2024 08:38:55 PM COMPARISON: 03/20/2023 CLINICAL HISTORY: Fall FINDINGS: LUNGS AND PLEURA: No focal pulmonary opacity. No pleural effusion. No pneumothorax. HEART AND MEDIASTINUM: No  acute abnormality of the cardiac and mediastinal silhouettes. BONES AND SOFT TISSUES: No acute osseous abnormality. IMPRESSION: 1. No acute process. Electronically signed by: Pinkie Pebbles MD 02/09/2024 08:46 PM EST RP Workstation: [id number]   DG Pelvis Portable Result Date: 02/09/2024 EXAM: 1 Views Xray of the Pelvis 02/09/2024 08:38:55 PM COMPARISON: None available. CLINICAL HISTORY: Fall FINDINGS: BONES AND JOINTS: No acute fracture. No malalignment. Hemisacralization of the right L5 vertebral body. SOFT TISSUES: Unremarkable. IMPRESSION: 1. No evidence of acute traumatic injury. Electronically signed by: Pinkie Pebbles MD 02/09/2024 08:45 PM EST RP Workstation: [id number]   DG Lumbar Spine Complete Result Date: 02/09/2024 EXAM: 4 Views  Xray of the Lumbar Spine 02/09/2024 08:38:55 PM COMPARISON: None available. CLINICAL HISTORY: Fall FINDINGS: LUMBAR SPINE: BONES: Vertebral body heights are maintained. Alignment is normal. Grade 1 anterolisthesis of L4 on L5. DISCS AND DEGENERATIVE CHANGES: Mild degenerative changes of the lower lumbar spine. SOFT TISSUES: No acute abnormality. IMPRESSION: 1. No traumatic injury. 2. Mild degenerative changes of the lower lumbar spine. Electronically signed by: Pinkie Pebbles MD 02/09/2024 08:45 PM EST RP Workstation: [id number]   CT CHEST LUNG CA SCREEN LOW DOSE W/O CM Result Date: 01/20/2024 CLINICAL DATA:  69 year old female former smoker with 49 pack-year smoking history, quit smoking 2 years prior EXAM: CT CHEST WITHOUT CONTRAST LOW-DOSE FOR LUNG CANCER SCREENING TECHNIQUE: Multidetector CT imaging of the chest was performed following the standard protocol without IV contrast. RADIATION DOSE REDUCTION: This exam was performed according to the departmental dose-optimization program which includes automated exposure control, adjustment of the mA and/or kV according to patient size and/or use of iterative reconstruction technique. COMPARISON:  03/17/2001 chest  CT.  12/16/2022 chest radiograph. FINDINGS: Cardiovascular: Normal heart size. No significant pericardial effusion/thickening. Three-vessel coronary atherosclerosis. Atherosclerotic nonaneurysmal thoracic aorta. Normal caliber pulmonary arteries. Mediastinum/Nodes: No significant thyroid  nodules. Unremarkable esophagus. No pathologically enlarged axillary, mediastinal or hilar lymph nodes, noting limited sensitivity for the detection of hilar adenopathy on this noncontrast study. Lungs/Pleura: No pneumothorax. No pleural effusion. Mild paraseptal and centrilobular emphysema with diffuse bronchial wall thickening. No acute consolidative airspace disease or lung masses. No significant pulmonary nodules. Upper abdomen: Large hiatal hernia.  Stomach is nondistended. Musculoskeletal: No aggressive appearing focal osseous lesions. Mild thoracic spondylosis. Moderate T5 vertebral compression fracture is of uncertain chronicity, appearing new since 12/16/2022 lateral chest radiograph, potentially acute or subacute. Chronic ununited posterolateral left seventh rib fracture. IMPRESSION: 1. Lung-RADS 1, negative. Continue annual screening with low-dose chest CT without contrast in 12 months. 2. Moderate T5 vertebral compression fracture, of uncertain chronicity, appearing new since 12/16/2022 lateral chest radiograph, potentially acute or subacute. 3. Large hiatal hernia. 4. Three-vessel coronary atherosclerosis. 5. Aortic Atherosclerosis (ICD10-I70.0) and Emphysema (ICD10-J43.9). These results will be called to the ordering clinician or representative by the Radiologist Assistant, and communication documented in the PACS or [employer]. Electronically Signed   By: Selinda DELENA Blue M.D.   On: 01/20/2024 14:57    Alm Schneider, DO  [facility]  If 7PM-7AM, please contact night-coverage [email or web address] Password [email or web address] 02/11/2024, 8:00 AM   LOS: 2 days   "

## 2024-02-11 NOTE — NC FL2 (Signed)
 " [place]  MEDICAID FL2 LEVEL OF CARE FORM     IDENTIFICATION  Patient Name: Debra Graves Birthdate: 02-05-1955 Sex: female Admission Date (Current Location): 02/09/2024  [place] and [place] Number:  Raynaldo [id number] Facility and Address:  [facility],  [address], [place] [address]      Provider Number: [id number]  Attending Physician Name and Address:  Evonnie Lenis, MD  Relative Name and Phone Number:       Current Level of Care: Hospital Recommended Level of Care: Skilled Nursing Facility Prior Approval Number:    Date Approved/Denied:   PASRR Number: pending  Discharge Plan: SNF    Current Diagnoses: Patient Active Problem List   Diagnosis Date Noted   Other thyrotoxicosis with thyrotoxic crisis or storm 02/11/2024   Fall at home 02/10/2024   Acute metabolic encephalopathy 02/10/2024   Atrial fibrillation with rapid ventricular response (HCC) 02/09/2024   Chronic midline low back pain without sciatica 01/29/2024   Hyperthyroidism 01/29/2024   Non-traumatic compression fracture of vertebral column (HCC) 01/27/2024   Elevated troponin 03/20/2023   Tobacco abuse 06/09/2016   Adrenal insufficiency 03/16/2015   Encounter for screening colonoscopy 05/25/2013   GERD (gastroesophageal reflux disease) 05/25/2013   Aortic insufficiency 02/22/2013   CAD (coronary artery disease) 02/22/2013   Rectocele 04/14/2012   Constipation 04/14/2012   Heart disease 04/13/2012   Hypercholesteremia 04/13/2012   Fibroids 04/13/2012   Arthritis 04/13/2012   Anxiety 04/13/2012   Depression 04/13/2012    Orientation RESPIRATION BLADDER Height & Weight     Self  O2 (4L) Continent Weight: 205 lb 7.5 oz (93.2 kg) Height:  5' 6 (167.6 cm)  BEHAVIORAL SYMPTOMS/MOOD NEUROLOGICAL BOWEL NUTRITION STATUS      Continent Diet (See d/c summary)  AMBULATORY STATUS COMMUNICATION OF NEEDS Skin   Extensive Assist Verbally Normal                       Personal  Care Assistance Level of Assistance  Bathing, Feeding, Dressing Bathing Assistance: Maximum assistance Feeding assistance: Limited assistance Dressing Assistance: Maximum assistance     Functional Limitations Info  Sight, Hearing, Speech Sight Info: Adequate Hearing Info: Adequate Speech Info: Adequate    SPECIAL CARE FACTORS FREQUENCY  PT (By licensed PT)     PT Frequency: 5x weekly              Contractures      Additional Factors Info  Code Status, Allergies, Psychotropic Code Status Info: Full Allergies Info: Albuterol  Bactrim (Sulfamethoxazole-trimethoprim)  Vytorin (Ezetimibe -simvastatin) Psychotropic Info: Klonopin , Ativan , Cymbalta          Current Medications (02/11/2024):  This is the current hospital active medication list Current Facility-Administered Medications  Medication Dose Route Frequency Provider Last Rate Last Admin   acetaminophen  (TYLENOL ) tablet 650 mg  650 mg Oral Q6H PRN Hall, Carole N, DO   650 mg at 02/10/24 2040   apixaban  (ELIQUIS ) tablet 5 mg  5 mg Oral BID Tat, Lenis, MD   5 mg at 02/11/24 0749   cefTRIAXone  (ROCEPHIN ) 1 g in sodium chloride  0.9 % 100 mL IVPB  1 g Intravenous Q24H Evonnie Lenis, MD 200 mL/hr at 02/11/24 0919 1 g at 02/11/24 0919   Chlorhexidine  Gluconate Cloth 2 % PADS 6 each  6 each Topical Daily Shona Laurence N, DO   6 each at 02/10/24 1024   diltiazem  (CARDIZEM ) 125 mg in dextrose  5% 125 mL (1 mg/mL) infusion  5-15 mg/hr Intravenous  Continuous [place]and, Julie, MD 5 mL/hr at 02/11/24 0856 5 mg/hr at 02/11/24 0856   DULoxetine  (CYMBALTA ) DR capsule 60 mg  60 mg Oral Daily Shona Terry SAILOR, DO   60 mg at 02/11/24 9250   ezetimibe  (ZETIA ) tablet 10 mg  10 mg Oral Daily Shona Terry N, DO   10 mg at 02/11/24 9251   hydrocortisone  sodium succinate  (SOLU-CORTEF ) 100 MG injection 100 mg  100 mg Intravenous TID Evonnie Lenis, MD   100 mg at 02/11/24 0920   lidocaine  (LIDODERM ) 5 % 1 patch  1 patch Transdermal Q24H Daniels, James K, NP   1  patch at 02/11/24 0345   LORazepam  (ATIVAN ) injection 1 mg  1 mg Intravenous Q6H PRN Evonnie Lenis, MD   1 mg at 02/10/24 2226   melatonin tablet 6 mg  6 mg Oral QHS PRN Shona Terry SAILOR, DO   6 mg at 02/10/24 2040   methimazole  (TAPAZOLE ) tablet 20 mg  20 mg Oral TID Evonnie Lenis, MD   20 mg at 02/11/24 0920   metoprolol  tartrate (LOPRESSOR ) tablet 25 mg  25 mg Oral QID Alvan Dorn FALCON, MD   25 mg at 02/11/24 0920   nicotine  (NICODERM CQ  - dosed in mg/24 hr) patch 7 mg  7 mg Transdermal Daily Daniels, James K, NP   7 mg at 02/11/24 9080   Oral care mouth rinse  15 mL Mouth Rinse PRN Shona Terry N, DO       polyethylene glycol (MIRALAX  / GLYCOLAX ) packet 17 g  17 g Oral Daily PRN Shona Terry SAILOR, DO       potassium chloride  SA (KLOR-CON  M) CR tablet 40 mEq  40 mEq Oral BID Dunlap, Scotesia Y, PA-C   40 mEq at 02/10/24 2119   potassium iodide  (SSKI ) 1 GM/ML solution 200 mg  200 mg Oral QID Tat, Lenis, MD       prochlorperazine  (COMPAZINE ) injection 5 mg  5 mg Intravenous Q6H PRN Shona Terry N, DO   5 mg at 02/10/24 0011   rosuvastatin  (CRESTOR ) tablet 40 mg  40 mg Oral Daily Hall, Carole N, DO   40 mg at 02/11/24 9251     Discharge Medications: Please see discharge summary for a list of discharge medications.  Relevant Imaging Results:  Relevant Lab Results:   Additional Information SSN: [id number]  Mcarthur Saddie Kim, LCSW     "

## 2024-02-11 NOTE — TOC Initial Note (Signed)
 Transition of Care [facility]) - Initial/Assessment Note    Patient Details  Name: Debra Graves MRN: [id number] Date of Birth: 1955-09-23  Transition of Care [facility]) CM/SW Contact:    Mcarthur Saddie Kim, LCSW Phone Number: 02/11/2024, 9:27 AM  Clinical Narrative:  Pt admitted due to atrial fibrillation with RVR. Assessment completed with pt's son as pt oriented to self only per chart. Pt's son reports pt lives alone. He has been staying with her some at night, but pt's son works during the day. PT evaluated pt and recommend SNF. LCSW discussed placement process, including Medicare.gov ratings and insurance authorization. Pt's son is agreeable. Requests [place] placement if available. TOC will initiate bed search and start auth when appropriate.                  Expected Discharge Plan: Skilled Nursing Facility Barriers to Discharge: Continued Medical Work up   Patient Goals and CMS Choice Patient states their goals for this hospitalization and ongoing recovery are:: short term SNF   Choice offered to / list presented to : Adult Children [place] ownership interest in [facility].provided to:: Adult Children    Expected Discharge Plan and Services In-house Referral: Clinical Social Work   Post Acute Care Choice: Skilled Nursing Facility Living arrangements for the past 2 months: Single Family Home                                      Prior Living Arrangements/Services Living arrangements for the past 2 months: Single Family Home Lives with:: Self Patient language and need for interpreter reviewed:: Yes Do you feel safe going back to the place where you live?: Yes      Need for Family Participation in Patient Care: Yes (Comment)     Criminal Activity/Legal Involvement Pertinent to Current Situation/Hospitalization: No - Comment as needed  Activities of Daily Living   ADL Screening (condition at time of admission) Independently performs ADLs?:  No Does the patient have a NEW difficulty with bathing/dressing/toileting/self-feeding that is expected to last >3 days?: Yes (Initiates electronic notice to provider for possible OT consult) Does the patient have a NEW difficulty with getting in/out of bed, walking, or climbing stairs that is expected to last >3 days?: Yes (Initiates electronic notice to provider for possible PT consult) Does the patient have a NEW difficulty with communication that is expected to last >3 days?: Yes (Initiates electronic notice to provider for possible SLP consult) Is the patient deaf or have difficulty hearing?: No Does the patient have difficulty seeing, even when wearing glasses/contacts?: No Does the patient have difficulty concentrating, remembering, or making decisions?: Yes  Permission Sought/Granted                  Emotional Assessment   Attitude/Demeanor/Rapport: Unable to Assess Affect (typically observed): Unable to Assess Orientation: : Oriented to Self Alcohol / Substance Use: Not Applicable Psych Involvement: No (comment)  Admission diagnosis:  Hyperthyroidism [E05.90] Atrial fibrillation with rapid ventricular response (HCC) [I48.91] Atrial fibrillation with RVR (HCC) [I48.91] Fall in home, initial encounter [W19.CHERENE, Y92.009] Patient Active Problem List   Diagnosis Date Noted   Other thyrotoxicosis with thyrotoxic crisis or storm 02/11/2024   Fall at home 02/10/2024   Acute metabolic encephalopathy 02/10/2024   Atrial fibrillation with rapid ventricular response (HCC) 02/09/2024   Chronic midline low back pain without sciatica 01/29/2024   Hyperthyroidism  01/29/2024   Non-traumatic compression fracture of vertebral column (HCC) 01/27/2024   Elevated troponin 03/20/2023   Tobacco abuse 06/09/2016   Adrenal insufficiency 03/16/2015   Encounter for screening colonoscopy 05/25/2013   GERD (gastroesophageal reflux disease) 05/25/2013   Aortic insufficiency 02/22/2013   CAD  (coronary artery disease) 02/22/2013   Rectocele 04/14/2012   Constipation 04/14/2012   Heart disease 04/13/2012   Hypercholesteremia 04/13/2012   Fibroids 04/13/2012   Arthritis 04/13/2012   Anxiety 04/13/2012   Depression 04/13/2012   PCP:  Bevely Doffing, FNP Pharmacy:   [facility] - [place], [place] - [address] [place] [address] Phone: [phone] Fax: [phone]     Social Drivers of Health (SDOH) Social History: SDOH Screenings   Food Insecurity: Patient Unable To Answer (02/09/2024)  Housing: Unknown (02/09/2024)  Transportation Needs: No Transportation Needs (02/09/2024)  Utilities: Not At Risk (02/09/2024)  Depression (PHQ2-9): Low Risk (01/27/2024)  Social Connections: Unknown (02/09/2024)  Tobacco Use: Medium Risk (02/09/2024)   SDOH Interventions:     Readmission Risk Interventions    02/10/2024    2:53 PM  Readmission Risk Prevention Plan  Transportation Screening Complete  Home Care Screening Complete  Medication Review (RN CM) Complete

## 2024-02-12 ENCOUNTER — Other Ambulatory Visit (HOSPITAL_COMMUNITY)

## 2024-02-12 ENCOUNTER — Inpatient Hospital Stay (HOSPITAL_COMMUNITY)

## 2024-02-12 DIAGNOSIS — I4891 Unspecified atrial fibrillation: Secondary | ICD-10-CM | POA: Diagnosis not present

## 2024-02-12 DIAGNOSIS — E0581 Other thyrotoxicosis with thyrotoxic crisis or storm: Secondary | ICD-10-CM | POA: Diagnosis not present

## 2024-02-12 DIAGNOSIS — G9341 Metabolic encephalopathy: Secondary | ICD-10-CM | POA: Diagnosis not present

## 2024-02-12 LAB — BLOOD GAS, VENOUS
Acid-base deficit: 6.4 mmol/L — ABNORMAL HIGH (ref 0.0–2.0)
Bicarbonate: 19.1 mmol/L — ABNORMAL LOW (ref 20.0–28.0)
Drawn by: 442
O2 Saturation: 61.2 %
Patient temperature: 37.3
pCO2, Ven: 37 mmHg — ABNORMAL LOW (ref 44–60)
pH, Ven: 7.32 (ref 7.25–7.43)
pO2, Ven: 40 mmHg (ref 32–45)

## 2024-02-12 LAB — GLUCOSE, CAPILLARY
Glucose-Capillary: 94 mg/dL (ref 70–99)
Glucose-Capillary: 98 mg/dL (ref 70–99)

## 2024-02-12 LAB — BASIC METABOLIC PANEL WITH GFR
Anion gap: 16 — ABNORMAL HIGH (ref 5–15)
BUN: 63 mg/dL — ABNORMAL HIGH (ref 8–23)
CO2: 21 mmol/L — ABNORMAL LOW (ref 22–32)
Calcium: 9.9 mg/dL (ref 8.9–10.3)
Chloride: 106 mmol/L (ref 98–111)
Creatinine, Ser: 1.32 mg/dL — ABNORMAL HIGH (ref 0.44–1.00)
GFR, Estimated: 43 mL/min — ABNORMAL LOW
Glucose, Bld: 112 mg/dL — ABNORMAL HIGH (ref 70–99)
Potassium: 5.3 mmol/L — ABNORMAL HIGH (ref 3.5–5.1)
Sodium: 142 mmol/L (ref 135–145)

## 2024-02-12 LAB — CBC
HCT: 30.5 % — ABNORMAL LOW (ref 36.0–46.0)
Hemoglobin: 9.5 g/dL — ABNORMAL LOW (ref 12.0–15.0)
MCH: 27.6 pg (ref 26.0–34.0)
MCHC: 31.1 g/dL (ref 30.0–36.0)
MCV: 88.7 fL (ref 80.0–100.0)
Platelets: 399 K/uL (ref 150–400)
RBC: 3.44 MIL/uL — ABNORMAL LOW (ref 3.87–5.11)
RDW: 15 % (ref 11.5–15.5)
WBC: 17.8 K/uL — ABNORMAL HIGH (ref 4.0–10.5)
nRBC: 0.4 % — ABNORMAL HIGH (ref 0.0–0.2)

## 2024-02-12 LAB — MAGNESIUM: Magnesium: 2.6 mg/dL — ABNORMAL HIGH (ref 1.7–2.4)

## 2024-02-12 MED ORDER — LORAZEPAM 2 MG/ML IJ SOLN
2.0000 mg | Freq: Four times a day (QID) | INTRAMUSCULAR | Status: DC | PRN
  Administered 2024-02-12: 2 mg via INTRAVENOUS
  Filled 2024-02-12: qty 1

## 2024-02-12 MED ORDER — SODIUM CHLORIDE 0.9 % IV SOLN: INTRAVENOUS | Status: DC

## 2024-02-12 MED ORDER — METHIMAZOLE 5 MG PO TABS
20.0000 mg | ORAL_TABLET | Freq: Four times a day (QID) | ORAL | Status: DC
  Administered 2024-02-12 – 2024-02-14 (×7): 20 mg via ORAL
  Filled 2024-02-12: qty 4
  Filled 2024-02-12 (×2): qty 2
  Filled 2024-02-12 (×4): qty 4
  Filled 2024-02-12: qty 2

## 2024-02-12 MED ORDER — SODIUM ZIRCONIUM CYCLOSILICATE 10 G PO PACK
10.0000 g | PACK | Freq: Once | ORAL | Status: AC
  Administered 2024-02-12: 10 g via ORAL
  Filled 2024-02-12: qty 1

## 2024-02-12 MED ORDER — OLANZAPINE 5 MG PO TBDP
5.0000 mg | ORAL_TABLET | Freq: Once | ORAL | Status: AC
  Administered 2024-02-12: 5 mg via ORAL
  Filled 2024-02-12: qty 1

## 2024-02-12 MED ORDER — DEXMEDETOMIDINE HCL IN NACL 400 MCG/100ML IV SOLN
0.0000 ug/kg/h | INTRAVENOUS | Status: DC
  Administered 2024-02-12 – 2024-02-13 (×2): 0.4 ug/kg/h via INTRAVENOUS
  Filled 2024-02-12 (×2): qty 100

## 2024-02-12 MED ORDER — POTASSIUM IODIDE (EXPECTORANT) 1 GM/ML PO SOLN
250.0000 mg | Freq: Four times a day (QID) | ORAL | Status: DC
  Administered 2024-02-12 – 2024-02-13 (×7): 250 mg via ORAL
  Filled 2024-02-12 (×10): qty 0.25

## 2024-02-12 NOTE — Significant Event (Signed)
"  ° °      CROSS COVER NOTE  NAME: [name] MRN: [id number] DOB : [birth year] ATTENDING PHYSICIAN: Evonnie Lenis, MD    Date of Service   02/12/2024   HPI/Events of Note   TRH Cross cover at [facility] Overnight events  intermittent agitation and restlessness. Nurse with sitter unable to redirect patient. Removed multiple Ivs despite lap belt and mittens, also additional ativan  dose, haldol  and zyprexa ,  necessitating wrist restraints   Interventions   Assessment/Plan: See above        Erminio LITTIE Cone NP [facility] Cross Cover 7pm-7am - check amion for availability Pager [phone]  "

## 2024-02-12 NOTE — Progress Notes (Signed)
 "          PROGRESS NOTE  Debra Graves FMW:[id number] DOB: Dec 19, 1955 DOA: Mar 03, 2024 PCP: Bevely Doffing, FNP  Brief History:  69 year old female with a history of paroxysmal atrial fibrillation on apixaban , hyperthyroidism, hypertension, hyperlipidemia, coronary disease, anxiety presenting with multiple falls at home. The patient was confused at the time of presentation and was unable to provide any history.  In the ED, the patient was tachycardic with atrial fibrillation with RVR with heart rates of 150s.  The patient was afebrile and hemodynamically stable.  She was started on diltiazem  drip.  The patient's HR gradually improved with the diltiazem  drip and metoprolol .  She remained confused intermittently agitated. As result, more aggressive treatment for thyrotoxicosis was started.   Assessment/Plan:  Atrial fibrillation with RVR -Continue diltiazem  drip>>weaned off -1/23 restarted diltiazem  due to agitation - Restart metoprolol  succinate (IV lopressor  if not tolerating po) - Continue apixaban    Thyrotoxicosis - 12/14/2023 TSH <0.100 -12/14/2023 free T4---0.61 -12/14/2023 thyroid  functions were improving with regard to her hyperthyroidism when compared to prior studies - 03/03/2024 TSH <0.100 - 03-03-24 Free T4 >5.50 - Increase methimazole  to 20 mg q 6 hrs - Increase Solu-Cortef  to 100 mg TID - increase SSKI  250 mg every 6 hours - will need NG tube if unable to take po   Acute metabolic encephalopathy - 02/11/24--more alert, but remains intermittently agitated - B12--666 - Folic acid --10.2 - Ammonia <13 - VBG 7.39/43/43/26 - CT brain--neg - UA 11-20 WBC>> send urine culture - Send urine culture>>Group B strep - continue ceftriaxone  - PDMP reviewed-- -Ativan  1 mg, #90, last refill 01/27/2024 - Klonopin  0.5 mg, #30, last refill 01/27/2024 - increase ativan  to 2 mg prn agitation - may need precedex if remains danger to self and staff  UTI -UA 11-20 WBC -culture =  Group B strept -continue ceftriaxone     Leukemoid reaction - Likely stress demargination and steroids - Start empiric ceftriaxone  - Follow blood cultures--neg - Patient is afebrile and hemodynamically stable   Chronic HFpEF - 06/13/2022 echo EF 65 to 70%, grade 1 DD - clinically compensated   Anxiety - Patient is on baseline Ativan  and Klonopin  at home - PDMP reviewed as discussed above   Generalized weakness and multiple falls - PT evaluation once patient stable   CKD stage III A - Baseline creatinine 0.9-1.2 - Monitor BMP  Hyperkalemia -give lokelma x 1 -start IVF         Family Communication:  son bedside 1/23   Consultants:  cardiology   Code Status:  FULL    DVT Prophylaxis:  apixaban      Procedures: As Listed in Progress Note Above   Antibiotics: Ceftriaxone  1/21>>           Subjective: Pt is awake and confused and intermittently agitated.  Denies cp, sob.  Remainder ROS not possible due to encephalopathy  Objective: Vitals:   02/12/24 0500 02/12/24 0600 02/12/24 0700 02/12/24 1619  BP: (!) 146/53 (!) 143/52 (!) 136/99   Pulse: (!) 119 (!) 109 (!) 110   Resp: (!) 28 (!) 26 (!) 26   Temp:    99.1 F (37.3 C)  TempSrc:    Axillary  SpO2: 94% 91% 95%   Weight:      Height:        Intake/Output Summary (Last 24 hours) at 02/12/2024 1705 Last data filed at 02/12/2024 0458 Gross per 24 hour  Intake --  Output 500 ml  Net -500 ml  Weight change:  Exam:  General:  Pt is alert, follows commands appropriately, not in acute distress HEENT: No icterus, No thrush, No neck mass, [place]/AT Cardiovascular: RRR, S1/S2, no rubs, no gallops Respiratory: bibasilar rales.  No wheeze Abdomen: Soft/+BS, non tender, non distended, no guarding Extremities: Non pitting edema, No lymphangitis, No petechiae, No rashes, no synovitis   Data Reviewed: I have personally reviewed following labs and imaging studies Basic Metabolic Panel: Recent Labs  Lab  02/09/24 2046 02/10/24 0056 02/11/24 0454 02/12/24 1428  NA 139 138 138 142  K 3.8 4.0 5.1 5.3*  CL 102 101 104 106  CO2 21* 25 23 21*  GLUCOSE 110* 135* 144* 112*  BUN 33* 32* 47* 63*  CREATININE 0.99 1.07* 1.28* 1.32*  CALCIUM  10.9* 10.4* 10.4* 9.9  MG 1.8  --  2.6* 2.6*  PHOS  --  5.0*  --   --    Liver Function Tests: Recent Labs  Lab 02/09/24 2046 02/10/24 0056  AST 59* 54*  ALT 30 28  ALKPHOS 74 102  BILITOT 0.8 0.8  PROT 6.0* 5.5*  ALBUMIN 3.2* 3.8   No results for input(s): LIPASE, AMYLASE in the last 168 hours. Recent Labs  Lab 02/11/24 0454  AMMONIA <13   Coagulation Profile: No results for input(s): INR, PROTIME in the last 168 hours. CBC: Recent Labs  Lab 02/09/24 2046 02/10/24 0056 02/11/24 0454 02/12/24 1428  WBC 12.3* 11.2* 11.9* 17.8*  NEUTROABS 8.5* 7.5  --   --   HGB 10.3* 9.7* 8.8* 9.5*  HCT 33.3* 31.6* 28.9* 30.5*  MCV 87.6 88.0 88.9 88.7  PLT 321 317 294 399   Cardiac Enzymes: Recent Labs  Lab 02/09/24 2046  CKTOTAL 72   BNP: Invalid input(s): POCBNP CBG: Recent Labs  Lab 02/09/24 2056  GLUCAP 108*   HbA1C: No results for input(s): HGBA1C in the last 72 hours. Urine analysis:    Component Value Date/Time   COLORURINE YELLOW 02/10/2024 0705   APPEARANCEUR CLOUDY (A) 02/10/2024 0705   LABSPEC 1.017 02/10/2024 0705   PHURINE 5.0 02/10/2024 0705   GLUCOSEU NEGATIVE 02/10/2024 0705   HGBUR NEGATIVE 02/10/2024 0705   BILIRUBINUR NEGATIVE 02/10/2024 0705   BILIRUBINUR small (A) 12/16/2022 1620   KETONESUR NEGATIVE 02/10/2024 0705   PROTEINUR 30 (A) 02/10/2024 0705   UROBILINOGEN 0.2 12/16/2022 1620   NITRITE NEGATIVE 02/10/2024 0705   LEUKOCYTESUR TRACE (A) 02/10/2024 0705   Sepsis Labs: @LABRCNTIP (procalcitonin:4,lacticidven:4) ) Recent Results (from the past 240 hours)  Culture, blood (Routine X 2) w Reflex to ID Panel     Status: None (Preliminary result)   Collection Time: 02/10/24 12:56 AM    Specimen: BLOOD  Result Value Ref Range Status   Specimen Description BLOOD BLOOD LEFT ARM  Final   Special Requests   Final    BOTTLES DRAWN AEROBIC AND ANAEROBIC Blood Culture adequate volume   Culture   Final    NO GROWTH 2 DAYS Performed at [facility], [address], [place], [place] [address]    Report Status PENDING  Incomplete  Culture, blood (Routine X 2) w Reflex to ID Panel     Status: None (Preliminary result)   Collection Time: 02/10/24 12:56 AM   Specimen: BLOOD  Result Value Ref Range Status   Specimen Description BLOOD BLOOD LEFT ARM  Final   Special Requests   Final    BOTTLES DRAWN AEROBIC AND ANAEROBIC Blood Culture adequate volume   Culture   Final  NO GROWTH 2 DAYS Performed at [facility], [address] [place], [place] [address]    Report Status PENDING  Incomplete  Urine Culture (for pregnant, neutropenic or urologic patients or patients with an indwelling urinary catheter)     Status: Abnormal   Collection Time: 02/10/24  7:05 AM   Specimen: Urine, Catheterized  Result Value Ref Range Status   Specimen Description   Final    URINE, CATHETERIZED Performed at [facility], [address], [place], [place] [address]    Special Requests   Final    NONE Performed at [facility], [address] [place], [place] [address]    Culture (A)  Final    20,000 COLONIES/mL GROUP B STREP(S.AGALACTIAE)ISOLATED TESTING AGAINST S. AGALACTIAE NOT ROUTINELY PERFORMED DUE TO PREDICTABILITY OF AMP/PEN/VAN SUSCEPTIBILITY. Performed at [facility], [address], [place], [place] [address]    Report Status 02/11/2024 FINAL  Final  MRSA Next Gen by PCR, Nasal     Status: None   Collection Time: 02/10/24 11:10 AM   Specimen: Nasal Mucosa; Nasal Swab  Result Value Ref Range Status   MRSA by PCR Next Gen NOT DETECTED NOT DETECTED Final    Comment: (NOTE) The GeneXpert MRSA Assay (FDA approved for NASAL specimens only), is one component of a comprehensive MRSA  colonization surveillance program. It is not intended to diagnose MRSA infection nor to guide or monitor treatment for MRSA infections. Test performance is not FDA approved in patients less than 72 years old. Performed at [facility], [address], [place], [place] [address]      Scheduled Meds:  apixaban   5 mg Oral BID   Chlorhexidine  Gluconate Cloth  6 each Topical Daily   ezetimibe   10 mg Oral Daily   hydrocortisone  sod succinate (SOLU-CORTEF ) inj  100 mg Intravenous TID   methimazole   20 mg Oral Q6H   metoprolol  tartrate  25 mg Oral QID   potassium iodide   250 mg Oral QID   rosuvastatin   40 mg Oral Daily   sodium zirconium cyclosilicate  10 g Oral Once   Continuous Infusions:  sodium chloride  75 mL/hr at 02/12/24 1613   cefTRIAXone  (ROCEPHIN )  IV 1 g (02/12/24 1134)   diltiazem  (CARDIZEM ) infusion 15 mg/hr (02/12/24 1215)    Procedures/Studies: CT Cervical Spine Wo Contrast Result Date: 02/09/2024 EXAM: CT CERVICAL SPINE WITHOUT CONTRAST 02/09/2024 08:47:36 PM TECHNIQUE: CT of the cervical spine was performed without the administration of intravenous contrast. Multiplanar reformatted images are provided for review. Automated exposure control, iterative reconstruction, and/or weight based adjustment of the mA/kV was utilized to reduce the radiation dose to as low as reasonably achievable. COMPARISON: None available. CLINICAL HISTORY: Polytrauma, blunt FINDINGS: BONES AND ALIGNMENT: No acute fracture or traumatic malalignment. DEGENERATIVE CHANGES: No significant degenerative changes. SOFT TISSUES: No prevertebral soft tissue swelling. IMPRESSION: 1. No significant abnormality. Electronically signed by: Pinkie Pebbles MD 02/09/2024 08:51 PM EST RP Workstation: [id number]   CT Head Wo Contrast Result Date: 02/09/2024 EXAM: CT HEAD WITHOUT CONTRAST 02/09/2024 08:47:36 PM TECHNIQUE: CT of the head was performed without the administration of intravenous contrast. Automated exposure  control, iterative reconstruction, and/or weight based adjustment of the mA/kV was utilized to reduce the radiation dose to as low as reasonably achievable. COMPARISON: None available. CLINICAL HISTORY: Polytrauma, blunt. FINDINGS: BRAIN AND VENTRICLES: No acute hemorrhage. No evidence of acute infarct. No hydrocephalus. No extra-axial collection. No mass effect or midline shift. Bilateral basal ganglia mineralization. ORBITS: No acute abnormality.  SINUSES: No acute abnormality. SOFT TISSUES AND SKULL: No acute soft tissue abnormality. No skull fracture. VASCULATURE: Atherosclerotic calcifications of carotid siphons and vertebral arteries. IMPRESSION: 1. No acute intracranial abnormality. Electronically signed by: Pinkie Pebbles MD 02/09/2024 08:50 PM EST RP Workstation: [id number]   DG Chest Portable 1 View Result Date: 02/09/2024 EXAM: 1 VIEW XRAY OF THE CHEST 02/09/2024 08:38:55 PM COMPARISON: 03/20/2023 CLINICAL HISTORY: Fall FINDINGS: LUNGS AND PLEURA: No focal pulmonary opacity. No pleural effusion. No pneumothorax. HEART AND MEDIASTINUM: No acute abnormality of the cardiac and mediastinal silhouettes. BONES AND SOFT TISSUES: No acute osseous abnormality. IMPRESSION: 1. No acute process. Electronically signed by: Pinkie Pebbles MD 02/09/2024 08:46 PM EST RP Workstation: [id number]   DG Pelvis Portable Result Date: 02/09/2024 EXAM: 1 Views Xray of the Pelvis 02/09/2024 08:38:55 PM COMPARISON: None available. CLINICAL HISTORY: Fall FINDINGS: BONES AND JOINTS: No acute fracture. No malalignment. Hemisacralization of the right L5 vertebral body. SOFT TISSUES: Unremarkable. IMPRESSION: 1. No evidence of acute traumatic injury. Electronically signed by: Pinkie Pebbles MD 02/09/2024 08:45 PM EST RP Workstation: [id number]   DG Lumbar Spine Complete Result Date: 02/09/2024 EXAM: 4 Views Xray of the Lumbar Spine 02/09/2024 08:38:55 PM COMPARISON: None available. CLINICAL HISTORY: Fall FINDINGS: LUMBAR  SPINE: BONES: Vertebral body heights are maintained. Alignment is normal. Grade 1 anterolisthesis of L4 on L5. DISCS AND DEGENERATIVE CHANGES: Mild degenerative changes of the lower lumbar spine. SOFT TISSUES: No acute abnormality. IMPRESSION: 1. No traumatic injury. 2. Mild degenerative changes of the lower lumbar spine. Electronically signed by: Pinkie Pebbles MD 02/09/2024 08:45 PM EST RP Workstation: [id number]    Alm Schneider, DO  [facility]  If 7PM-7AM, please contact night-coverage [email or web address] Password [email or web address] 02/12/2024, 5:05 PM   LOS: 3 days   "

## 2024-02-12 NOTE — Progress Notes (Signed)
 Telemetry reviewed   History of PAF, issues with afib with RVR this admission in setting of hyperthyroidism, TSH undetectable since 11/2023, free T4 this admit >5.50. Started on oral lopressor  yesterday 25mg  qid. Had weaned off dilt drip yeserday, back on this AM in setting of agitation. Actually back in sinus rhythm today, inus tach 130s in setting of agitation, follow with management of her agitation. Can wean dilt drip again as able  Dorn Ross MD       For questions or updates, please contact  [facility] Please consult [email or web address] for contact info under     Signed, Ross Dorn, MD  02/12/2024, 8:16 AM

## 2024-02-13 ENCOUNTER — Inpatient Hospital Stay (HOSPITAL_COMMUNITY)

## 2024-02-13 DIAGNOSIS — I959 Hypotension, unspecified: Secondary | ICD-10-CM

## 2024-02-13 DIAGNOSIS — N1831 Chronic kidney disease, stage 3a: Secondary | ICD-10-CM | POA: Diagnosis not present

## 2024-02-13 DIAGNOSIS — G9341 Metabolic encephalopathy: Secondary | ICD-10-CM | POA: Diagnosis not present

## 2024-02-13 DIAGNOSIS — N179 Acute kidney failure, unspecified: Secondary | ICD-10-CM | POA: Diagnosis not present

## 2024-02-13 DIAGNOSIS — E875 Hyperkalemia: Secondary | ICD-10-CM

## 2024-02-13 DIAGNOSIS — D72829 Elevated white blood cell count, unspecified: Secondary | ICD-10-CM

## 2024-02-13 DIAGNOSIS — E0581 Other thyrotoxicosis with thyrotoxic crisis or storm: Secondary | ICD-10-CM | POA: Diagnosis not present

## 2024-02-13 DIAGNOSIS — J9601 Acute respiratory failure with hypoxia: Secondary | ICD-10-CM

## 2024-02-13 DIAGNOSIS — I4891 Unspecified atrial fibrillation: Secondary | ICD-10-CM | POA: Diagnosis not present

## 2024-02-13 LAB — BASIC METABOLIC PANEL WITH GFR
Anion gap: 16 — ABNORMAL HIGH (ref 5–15)
BUN: 76 mg/dL — ABNORMAL HIGH (ref 8–23)
CO2: 21 mmol/L — ABNORMAL LOW (ref 22–32)
Calcium: 9.2 mg/dL (ref 8.9–10.3)
Chloride: 112 mmol/L — ABNORMAL HIGH (ref 98–111)
Creatinine, Ser: 1.78 mg/dL — ABNORMAL HIGH (ref 0.44–1.00)
GFR, Estimated: 30 mL/min — ABNORMAL LOW
Glucose, Bld: 132 mg/dL — ABNORMAL HIGH (ref 70–99)
Potassium: 5.6 mmol/L — ABNORMAL HIGH (ref 3.5–5.1)
Sodium: 148 mmol/L — ABNORMAL HIGH (ref 135–145)

## 2024-02-13 LAB — PROCALCITONIN: Procalcitonin: 0.37 ng/mL

## 2024-02-13 LAB — BLOOD GAS, ARTERIAL
Acid-base deficit: 5.6 mmol/L — ABNORMAL HIGH (ref 0.0–2.0)
Acid-base deficit: 4.8 mmol/L — ABNORMAL HIGH (ref 0.0–2.0)
Bicarbonate: 18.2 mmol/L — ABNORMAL LOW (ref 20.0–28.0)
Bicarbonate: 19.7 mmol/L — ABNORMAL LOW (ref 20.0–28.0)
Drawn by: 37588
Drawn by: 22179
FIO2: 44 %
FIO2: 56 %
O2 Saturation: 88.6 %
O2 Saturation: 80.2 %
Patient temperature: 36.4
Patient temperature: 36.8
pCO2 arterial: 29 mmHg — ABNORMAL LOW (ref 32–48)
pCO2 arterial: 34 mmHg (ref 32–48)
pH, Arterial: 7.4 (ref 7.35–7.45)
pH, Arterial: 7.37 (ref 7.35–7.45)
pO2, Arterial: 53 mmHg — ABNORMAL LOW (ref 83–108)
pO2, Arterial: 48 mmHg — ABNORMAL LOW (ref 83–108)

## 2024-02-13 LAB — POCT I-STAT 7, (LYTES, BLD GAS, ICA,H+H)
Acid-base deficit: 6 mmol/L — ABNORMAL HIGH (ref 0.0–2.0)
Bicarbonate: 20.9 mmol/L (ref 20.0–28.0)
Calcium, Ion: 1.18 mmol/L (ref 1.15–1.40)
HCT: 27 % — ABNORMAL LOW (ref 36.0–46.0)
Hemoglobin: 9.2 g/dL — ABNORMAL LOW (ref 12.0–15.0)
O2 Saturation: 98 %
Potassium: 4.7 mmol/L (ref 3.5–5.1)
Sodium: 145 mmol/L (ref 135–145)
TCO2: 22 mmol/L (ref 22–32)
pCO2 arterial: 49.7 mmHg — ABNORMAL HIGH (ref 32–48)
pH, Arterial: 7.231 — ABNORMAL LOW (ref 7.35–7.45)
pO2, Arterial: 131 mmHg — ABNORMAL HIGH (ref 83–108)

## 2024-02-13 LAB — GLUCOSE, CAPILLARY
Glucose-Capillary: 126 mg/dL — ABNORMAL HIGH (ref 70–99)
Glucose-Capillary: 124 mg/dL — ABNORMAL HIGH (ref 70–99)
Glucose-Capillary: 148 mg/dL — ABNORMAL HIGH (ref 70–99)
Glucose-Capillary: 122 mg/dL — ABNORMAL HIGH (ref 70–99)
Glucose-Capillary: 120 mg/dL — ABNORMAL HIGH (ref 70–99)
Glucose-Capillary: 121 mg/dL — ABNORMAL HIGH (ref 70–99)
Glucose-Capillary: 162 mg/dL — ABNORMAL HIGH (ref 70–99)

## 2024-02-13 LAB — CBC
HCT: 32.9 % — ABNORMAL LOW (ref 36.0–46.0)
Hemoglobin: 9.8 g/dL — ABNORMAL LOW (ref 12.0–15.0)
MCH: 27.4 pg (ref 26.0–34.0)
MCHC: 29.8 g/dL — ABNORMAL LOW (ref 30.0–36.0)
MCV: 91.9 fL (ref 80.0–100.0)
Platelets: 254 10*3/uL (ref 150–400)
RBC: 3.58 MIL/uL — ABNORMAL LOW (ref 3.87–5.11)
RDW: 15.3 % (ref 11.5–15.5)
WBC: 16.7 10*3/uL — ABNORMAL HIGH (ref 4.0–10.5)
nRBC: 0.7 % — ABNORMAL HIGH (ref 0.0–0.2)

## 2024-02-13 LAB — CK: Total CK: 538 U/L — ABNORMAL HIGH (ref 38–234)

## 2024-02-13 LAB — MAGNESIUM: Magnesium: 2.8 mg/dL — ABNORMAL HIGH (ref 1.7–2.4)

## 2024-02-13 MED ORDER — SODIUM CHLORIDE 0.9 % IV SOLN
1.0000 g | INTRAVENOUS | Status: DC
  Administered 2024-02-14: 1 g via INTRAVENOUS
  Filled 2024-02-13: qty 10

## 2024-02-13 MED ORDER — ROSUVASTATIN CALCIUM 20 MG PO TABS: 20.0000 mg | ORAL_TABLET | Freq: Every day | ORAL | Status: DC

## 2024-02-13 MED ORDER — ZIPRASIDONE MESYLATE 20 MG IM SOLR
10.0000 mg | Freq: Once | INTRAMUSCULAR | Status: DC
  Filled 2024-02-13: qty 20

## 2024-02-13 MED ORDER — HYDROCORTISONE SOD SUC (PF) 100 MG IJ SOLR
100.0000 mg | Freq: Four times a day (QID) | INTRAMUSCULAR | Status: DC
  Administered 2024-02-13 – 2024-02-15 (×6): 100 mg via INTRAVENOUS
  Filled 2024-02-13 (×6): qty 2

## 2024-02-13 MED ORDER — PIPERACILLIN-TAZOBACTAM 3.375 G IVPB
3.3750 g | Freq: Three times a day (TID) | INTRAVENOUS | Status: DC
  Administered 2024-02-13: 3.375 g via INTRAVENOUS
  Filled 2024-02-13: qty 50

## 2024-02-13 MED ORDER — SODIUM CHLORIDE 0.45 % IV BOLUS
1000.0000 mL | Freq: Once | INTRAVENOUS | Status: AC
  Administered 2024-02-13: 1000 mL via INTRAVENOUS

## 2024-02-13 MED ORDER — SODIUM ZIRCONIUM CYCLOSILICATE 10 G PO PACK
10.0000 g | PACK | Freq: Three times a day (TID) | ORAL | Status: DC
  Administered 2024-02-13 – 2024-02-14 (×3): 10 g via ORAL
  Filled 2024-02-13 (×3): qty 1

## 2024-02-13 MED ORDER — SODIUM CHLORIDE 0.45 % IV SOLN: INTRAVENOUS | Status: AC

## 2024-02-13 MED ORDER — CALCIUM GLUCONATE-NACL 1-0.675 GM/50ML-% IV SOLN
1.0000 g | Freq: Once | INTRAVENOUS | Status: AC
  Administered 2024-02-13: 1000 mg via INTRAVENOUS
  Filled 2024-02-13: qty 50

## 2024-02-13 MED ORDER — NOREPINEPHRINE 4 MG/250ML-% IV SOLN
0.0000 ug/min | INTRAVENOUS | Status: DC
  Administered 2024-02-14: 5 ug/min via INTRAVENOUS
  Administered 2024-02-14 – 2024-02-15 (×2): 8 ug/min via INTRAVENOUS
  Administered 2024-02-15: 12 ug/min via INTRAVENOUS
  Filled 2024-02-13 (×5): qty 250

## 2024-02-13 MED ORDER — MIDODRINE HCL 5 MG PO TABS
10.0000 mg | ORAL_TABLET | Freq: Three times a day (TID) | ORAL | Status: DC
  Administered 2024-02-13 – 2024-02-14 (×3): 10 mg via ORAL
  Filled 2024-02-13 (×3): qty 2

## 2024-02-13 MED ORDER — PROPRANOLOL HCL 60 MG PO TABS
60.0000 mg | ORAL_TABLET | ORAL | Status: DC
  Administered 2024-02-13 – 2024-02-14 (×3): 60 mg via ORAL
  Filled 2024-02-13 (×6): qty 1

## 2024-02-13 MED ORDER — NOREPINEPHRINE 4 MG/250ML-% IV SOLN
INTRAVENOUS | Status: AC
  Administered 2024-02-13: 4 mg via INTRAVENOUS
  Filled 2024-02-13: qty 250

## 2024-02-13 MED ORDER — INSULIN ASPART 100 UNIT/ML IJ SOLN
0.0000 [IU] | INTRAMUSCULAR | Status: DC
  Administered 2024-02-13: 3 [IU] via SUBCUTANEOUS
  Administered 2024-02-14 (×5): 4 [IU] via SUBCUTANEOUS
  Administered 2024-02-14: 3 [IU] via SUBCUTANEOUS
  Administered 2024-02-15: 7 [IU] via SUBCUTANEOUS
  Administered 2024-02-15: 11 [IU] via SUBCUTANEOUS
  Administered 2024-02-15: 7 [IU] via SUBCUTANEOUS
  Filled 2024-02-13: qty 3
  Filled 2024-02-13: qty 11
  Filled 2024-02-13 (×2): qty 4
  Filled 2024-02-13: qty 5
  Filled 2024-02-13: qty 4
  Filled 2024-02-13 (×2): qty 3
  Filled 2024-02-13: qty 4
  Filled 2024-02-13: qty 7

## 2024-02-13 MED ORDER — ROCURONIUM BROMIDE 10 MG/ML (PF) SYRINGE
PREFILLED_SYRINGE | INTRAVENOUS | Status: AC
  Administered 2024-02-13: 100 mg
  Filled 2024-02-13: qty 10

## 2024-02-13 MED ORDER — SENNA 8.6 MG PO TABS
1.0000 | ORAL_TABLET | Freq: Two times a day (BID) | ORAL | Status: DC
  Administered 2024-02-13 – 2024-02-15 (×4): 8.6 mg
  Filled 2024-02-13 (×5): qty 1

## 2024-02-13 MED ORDER — PROPOFOL 1000 MG/100ML IV EMUL
INTRAVENOUS | Status: AC
  Filled 2024-02-13: qty 100

## 2024-02-13 MED ORDER — SODIUM CHLORIDE 0.9 % IV SOLN: 250.0000 mL | INTRAVENOUS | Status: AC

## 2024-02-13 MED ORDER — ETOMIDATE 2 MG/ML IV SOLN
INTRAVENOUS | Status: AC
  Administered 2024-02-13: 20 mg
  Filled 2024-02-13: qty 20

## 2024-02-13 MED ORDER — FAMOTIDINE 20 MG PO TABS
20.0000 mg | ORAL_TABLET | Freq: Every day | ORAL | Status: DC
  Administered 2024-02-13: 20 mg via ORAL
  Filled 2024-02-13: qty 1

## 2024-02-13 MED ORDER — IPRATROPIUM BROMIDE 0.02 % IN SOLN
0.5000 mg | Freq: Four times a day (QID) | RESPIRATORY_TRACT | Status: DC
  Administered 2024-02-14 – 2024-02-15 (×9): 0.5 mg via RESPIRATORY_TRACT
  Filled 2024-02-13 (×9): qty 2.5

## 2024-02-13 MED ORDER — PROPOFOL 1000 MG/100ML IV EMUL
0.0000 ug/kg/min | INTRAVENOUS | Status: DC
  Administered 2024-02-14: 20 ug/kg/min via INTRAVENOUS
  Administered 2024-02-14: 25 ug/kg/min via INTRAVENOUS
  Administered 2024-02-14 – 2024-02-15 (×2): 15 ug/kg/min via INTRAVENOUS
  Filled 2024-02-13 (×4): qty 100

## 2024-02-13 MED ORDER — LORAZEPAM 2 MG/ML IJ SOLN
INTRAMUSCULAR | Status: AC
  Filled 2024-02-13: qty 1

## 2024-02-13 MED ORDER — LORAZEPAM 2 MG/ML IJ SOLN
2.0000 mg | Freq: Once | INTRAMUSCULAR | Status: AC
  Administered 2024-02-13: 2 mg via INTRAVENOUS

## 2024-02-13 MED ORDER — FENTANYL 2500MCG IN NS 250ML (10MCG/ML) PREMIX INFUSION
0.0000 ug/h | INTRAVENOUS | Status: DC
  Administered 2024-02-15: 25 ug/h via INTRAVENOUS
  Filled 2024-02-13: qty 250

## 2024-02-13 MED ORDER — SODIUM ZIRCONIUM CYCLOSILICATE 10 G PO PACK
10.0000 g | PACK | Freq: Once | ORAL | Status: AC
  Administered 2024-02-13: 10 g via ORAL
  Filled 2024-02-13: qty 1

## 2024-02-13 MED ORDER — LACTATED RINGERS IV BOLUS
500.0000 mL | Freq: Once | INTRAVENOUS | Status: AC
  Administered 2024-02-13: 500 mL via INTRAVENOUS

## 2024-02-13 MED ORDER — POLYETHYLENE GLYCOL 3350 17 G PO PACK
17.0000 g | PACK | Freq: Every day | ORAL | Status: DC
  Administered 2024-02-13 – 2024-02-15 (×3): 17 g
  Filled 2024-02-13 (×3): qty 1

## 2024-02-13 MED ORDER — DULOXETINE HCL 30 MG PO CPEP: 30.0000 mg | ORAL_CAPSULE | Freq: Every day | ORAL | Status: DC

## 2024-02-13 NOTE — Progress Notes (Signed)
" ° ° ° °  Call from [facility] MD at [facility] - Dr Alm Tat for requesting transfer  S: Hyperthyroid, CAD with PAF. Now in thyroid  storm. On SSKI , Hydrocort , Beta blocker. HAs been agiate. In middle of al lthis Is A FIb RVR and getting AKI. Encephalopathy getting worse. Has UTI. On precedex  gtt     LABS    PULMONARY Recent Labs  Lab 02/10/24 0811 02/12/24 2236  HCO3 26.0 19.1*  O2SAT 72.1 61.2    CBC Recent Labs  Lab 02/10/24 0056 02/11/24 0454 02/12/24 1428  HGB 9.7* 8.8* 9.5*  HCT 31.6* 28.9* 30.5*  WBC 11.2* 11.9* 17.8*  PLT 317 294 399    COAGULATION No results for input(s): INR in the last 168 hours.  CARDIAC  No results for input(s): TROPONINI in the last 168 hours. No results for input(s): PROBNP in the last 168 hours.   CHEMISTRY Recent Labs  Lab 02/09/24 2046 02/10/24 0056 02/11/24 0454 02/12/24 1428 02/13/24 0630  NA 139 138 138 142 148*  K 3.8 4.0 5.1 5.3* 5.6*  CL 102 101 104 106 112*  CO2 21* 25 23 21* 21*  GLUCOSE 110* 135* 144* 112* 132*  BUN 33* 32* 47* 63* 76*  CREATININE 0.99 1.07* 1.28* 1.32* 1.78*  CALCIUM  10.9* 10.4* 10.4* 9.9 9.2  MG 1.8  --  2.6* 2.6* 2.8*  PHOS  --  5.0*  --   --   --    Estimated Creatinine Clearance: 34 mL/min (A) (by C-G formula based on SCr of 1.78 mg/dL (H)).   LIVER Recent Labs  Lab 02/09/24 2046 02/10/24 0056  AST 59* 54*  ALT 30 28  ALKPHOS 74 102  BILITOT 0.8 0.8  PROT 6.0* 5.5*  ALBUMIN 3.2* 3.8     INFECTIOUS Recent Labs  Lab 02/11/24 0454  PROCALCITON 0.16     ENDOCRINE CBG (last 3)  Recent Labs    02/12/24 2339 02/13/24 0320 02/13/24 0749  GLUCAP 98 126* 124*         IMAGING x48h  - image(s) personally visualized  -   highlighted in bold DG Abd Portable 1V Result Date: 02/12/2024 EXAM: 1 VIEW XRAY OF THE ABDOMEN 02/12/2024 11:06:28 PM COMPARISON: CT abdomen and pelvis for 05/06/2016. CLINICAL HISTORY: Encounter for nasogastric tube placement. ICD10: 747668  Encounter for nasogastric tube placement. FINDINGS: LINES, TUBES AND DEVICES: Enteric tube in place with distal tip and side port terminating within the expected location of the proximal stomach. BOWEL: Nonobstructive bowel gas pattern. SOFT TISSUES: No abnormal calcifications. BONES: No acute fracture. LIMITATIONS/ARTIFACTS: The patient is rotated. IMPRESSION: 1. Enteric tube tip and side port terminate within the proximal stomach. Electronically signed by: Morgane Naveau MD 02/12/2024 11:12 PM EST RP Workstation: [id number]    A/P Thyrotoxic Storm AKI orsnin A Fib RVR Acute encephaloathy - needing precedex  AT risk intubation Acute resp failure - 5L [place] (wa on RA 48-72h ago)  Plan  - ACCEPT  TO ANY MAJOR CAMPUS (no bed currently)(     SIGNATURE    Dr. Dorethia Cave, M.D., F.C.C.P,  Pulmonary and Critical Care Medicine Staff Physician, [facility] Director - Interstitial Lung Disease  Program  Pulmonary Fibrosis [facility] at [facility] [place], [place], [address]   Pager: [phone], If no answer  -> Check AMION or Try [phone] Telephone (clinical office): [phone] Telephone (research): [phone]  11:22 AM 02/13/2024   "

## 2024-02-13 NOTE — Progress Notes (Signed)
 eLink Physician-Brief Progress Note Patient Name: Debra Graves DOB: 1955/12/05 MRN: [id number]   Date of Service  02/13/2024  HPI/Events of Note  Patient now in ICU from [facility]. Intubated for encephalopathy and resp failure related to thyroid  storm. Noted she had A fib with RVR at outside facility needing intervention. Has known history of A fib. Now on vent. Chart reviewed.   eICU Interventions  CCM notified and will be admitting  Call E link if needed      Intervention Category Major Interventions: Respiratory failure - evaluation and management;Arrhythmia - evaluation and management Evaluation Type: New Patient Evaluation  Cheryll KANDICE Bang 02/13/2024, 7:54 PM

## 2024-02-13 NOTE — NC FL2 (Signed)
 " [place]  MEDICAID FL2 LEVEL OF CARE FORM     IDENTIFICATION  Patient Name: Debra Graves Birthdate: Dec 08, 1955 Sex: female Admission Date (Current Location): 02/09/2024  [place] and [place] Number:  Ray[place] [id number] K Facility and Address:  [facility],  [address] [place] [address]      Provider Number: [id number]  Attending Physician Name and Address:  Evonnie Lenis, MD  Relative Name and Phone Number:       Current Level of Care: Hospital Recommended Level of Care: Skilled Nursing Facility Prior Approval Number:    Date Approved/Denied:   PASRR Number: [id number]  Discharge Plan: SNF    Current Diagnoses: Patient Active Problem List   Diagnosis Date Noted   AKI (acute kidney injury) 02/13/2024   Other thyrotoxicosis with thyrotoxic crisis or storm 02/11/2024   Fall at home 02/10/2024   Acute metabolic encephalopathy 02/10/2024   Atrial fibrillation with rapid ventricular response (HCC) 02/09/2024   Chronic midline low back pain without sciatica 01/29/2024   Hyperthyroidism 01/29/2024   Non-traumatic compression fracture of vertebral column (HCC) 01/27/2024   Elevated troponin 03/20/2023   Tobacco abuse 06/09/2016   Adrenal insufficiency 03/16/2015   Encounter for screening colonoscopy 05/25/2013   GERD (gastroesophageal reflux disease) 05/25/2013   Aortic insufficiency 02/22/2013   CAD (coronary artery disease) 02/22/2013   Rectocele 04/14/2012   Constipation 04/14/2012   Heart disease 04/13/2012   Hypercholesteremia 04/13/2012   Fibroids 04/13/2012   Arthritis 04/13/2012   Anxiety 04/13/2012   Depression 04/13/2012    Orientation RESPIRATION BLADDER Height & Weight     Self  O2 (4L) Continent Weight: 91.8 kg Height:  5' 6 (167.6 cm)  BEHAVIORAL SYMPTOMS/MOOD NEUROLOGICAL BOWEL NUTRITION STATUS      Continent Diet (See d/c summary)  AMBULATORY STATUS COMMUNICATION OF NEEDS Skin   Extensive Assist Verbally Normal                        Personal Care Assistance Level of Assistance  Bathing, Feeding, Dressing Bathing Assistance: Maximum assistance Feeding assistance: Limited assistance Dressing Assistance: Maximum assistance     Functional Limitations Info  Sight, Hearing, Speech Sight Info: Adequate Hearing Info: Adequate Speech Info: Adequate    SPECIAL CARE FACTORS FREQUENCY  PT (By licensed PT)     PT Frequency: 5x weekly              Contractures      Additional Factors Info  Code Status, Allergies, Psychotropic Code Status Info: Full Allergies Info: Albuterol  Bactrim (Sulfamethoxazole-trimethoprim)  Vytorin (Ezetimibe -simvastatin) Psychotropic Info: Klonopin , Ativan , Cymbalta          Current Medications (02/13/2024):  This is the current hospital active medication list Current Facility-Administered Medications  Medication Dose Route Frequency Provider Last Rate Last Admin   0.45 % sodium chloride  infusion   Intravenous Continuous Tat, David, MD 100 mL/hr at 02/13/24 1200 Infusion Verify at 02/13/24 1200   acetaminophen  (TYLENOL ) tablet 650 mg  650 mg Oral Q6H PRN Shona Terry SAILOR, DO   650 mg at 02/10/24 2040   apixaban  (ELIQUIS ) tablet 5 mg  5 mg Oral BID Tat, Lenis, MD   5 mg at 02/13/24 1042   Chlorhexidine  Gluconate Cloth 2 % PADS 6 each  6 each Topical Daily Shona Terry N, DO   6 each at 02/13/24 1000   dexmedetomidine  (PRECEDEX ) 400 MCG/100ML (4 mcg/mL) infusion  0-1.2 mcg/kg/hr Intravenous Titrated Jesus America, NP 6.89 mL/hr at 02/13/24 1600 0.3  mcg/kg/hr at 02/13/24 1600   diltiazem  (CARDIZEM ) 125 mg in dextrose  5% 125 mL (1 mg/mL) infusion  5-15 mg/hr Intravenous Continuous Dean Clarity, MD   Stopped at 02/13/24 0604   hydrocortisone  sodium succinate  (SOLU-CORTEF ) 100 MG injection 100 mg  100 mg Intravenous TID Evonnie Lenis, MD   100 mg at 02/13/24 1550   LORazepam  (ATIVAN ) injection 2 mg  2 mg Intravenous Q6H PRN Evonnie Lenis, MD   2 mg at 02/12/24 1735   melatonin  tablet 6 mg  6 mg Oral QHS PRN Shona Laurence N, DO   6 mg at 02/12/24 2325   methimazole  (TAPAZOLE ) tablet 20 mg  20 mg Oral Q6H Evonnie Lenis, MD   20 mg at 02/13/24 1550   metoprolol  tartrate (LOPRESSOR ) tablet 25 mg  25 mg Oral QID Alvan Dorn FALCON, MD   25 mg at 02/12/24 2324   midodrine  (PROAMATINE ) tablet 10 mg  10 mg Oral TID WC Evonnie Lenis, MD   10 mg at 02/13/24 1601   Oral care mouth rinse  15 mL Mouth Rinse PRN Shona Laurence N, DO       piperacillin -tazobactam (ZOSYN ) IVPB 3.375 g  3.375 g Intravenous Q8H Tat, David, MD 12.5 mL/hr at 02/13/24 1600 Infusion Verify at 02/13/24 1600   polyethylene glycol (MIRALAX  / GLYCOLAX ) packet 17 g  17 g Oral Daily PRN Shona Laurence SAILOR, DO       potassium iodide  (SSKI ) 1 GM/ML solution 250 mg  250 mg Oral QID Tat, Lenis, MD   250 mg at 02/13/24 1316   prochlorperazine  (COMPAZINE ) injection 5 mg  5 mg Intravenous Q6H PRN Shona Laurence N, DO   5 mg at 02/10/24 0011   rosuvastatin  (CRESTOR ) tablet 40 mg  40 mg Oral Daily Shona Laurence N, DO   40 mg at 02/13/24 1042   sodium zirconium cyclosilicate  (LOKELMA ) packet 10 g  10 g Oral TID Evonnie Lenis, MD   10 g at 02/13/24 1549     Discharge Medications: Please see discharge summary for a list of discharge medications.  Relevant Imaging Results:  Relevant Lab Results:   Additional Information SSN: [id number]  Ronnald MARLA Sil, RN     "

## 2024-02-13 NOTE — Significant Event (Signed)
"  ° °      CROSS COVER NOTE  NAME: Debra Graves MRN: [id number] DOB : 11-04-1955 ATTENDING PHYSICIAN: Evonnie Lenis, MD    Date of Service   02/13/2024   HPI/Events of Note   [facility] cross Cover at [facility] received from nurse patient with intermittent agitation and attempting to remove lines but she is less communicative and no longer intermittently redirectable Also unable to swall    Interventions   Assessment/Plan: Appears medicated. Unable to follow commands. Focal deficits not apparent, tachuypnea is worse as is tachycardia.  MD notes reviewed NGT placemnt  Start precedex  for ongoing agitation       Erminio LITTIE Cone NP [facility] Cross Cover 7pm-7am - check amion for availability Pager [phone]  "

## 2024-02-13 NOTE — TOC Progression Note (Signed)
 Transition of Care [facility]) - Progression Note    Patient Details  Name: Debra Graves MRN: [id number] Date of Birth: Oct 25, 1955  Transition of Care [facility]) CM/SW Contact  Ronnald MARLA Sil, RN Phone Number: 02/13/2024, 4:47 PM  Clinical Narrative:    Patient discussed during Progression rounds this morning, patient remians in Step-down unit, confused with bilateral hand mittens in place, on precedex  gtt.  CM updated FL2 with PASRR # [id number], and attempted to contact family to review lack of SNF bed offers with Son Debra Graves - [phone] (number incorrect), Sis - Debra Graves (no answer), and Bro - Debra Graves: left voicemail msg requesting call back.  Family may need to consider expanding SNF referrals to neighboring areas to optimize available bed offers  UPDATE at 4:45pm: CM received call-back from Son Debra Graves, reviewed the 6-referrals disseminated, 4 - DECLINED: [facility] [place], [facility], and [facility], and 2 - CONSIDERING: [address] and [address]  Son agreed to [employer] expanding search and forwarding referrals to facilities in [place] and additional counties.  CM informed son of expected responses by late Monday afternoon and CM team would likely follow-up with him at that time, and that once bed selected patient would need Insurance Auth.  CM team will continue to follow along and assist as appropriate with finalizing discharge plan.   Expected Discharge Plan: Skilled Nursing Facility Barriers to Discharge: Continued Medical Work up               Expected Discharge Plan and Services In-house Referral: Clinical Social Work   Post Acute Care Choice: Skilled Nursing Facility Living arrangements for the past 2 months: Single Family Home                                       Social Drivers of Health (SDOH) Interventions SDOH Screenings   Food Insecurity: Patient Unable To Answer (02/09/2024)  Housing: Unknown (02/09/2024)  Transportation Needs: No Transportation Needs  (02/09/2024)  Utilities: Not At Risk (02/09/2024)  Depression (PHQ2-9): Low Risk (01/27/2024)  Social Connections: Unknown (02/09/2024)  Tobacco Use: Medium Risk (02/09/2024)    Readmission Risk Interventions    02/10/2024    2:53 PM  Readmission Risk Prevention Plan  Transportation Screening Complete  Home Care Screening Complete  Medication Review (RN CM) Complete

## 2024-02-13 NOTE — Plan of Care (Signed)
" °  Problem: Education: Goal: Knowledge of General Education information will improve Description: Including pain rating scale, medication(s)/side effects and non-pharmacologic comfort measures Outcome: Not Progressing   Problem: Health Behavior/Discharge Planning: Goal: Ability to manage health-related needs will improve Outcome: Not Progressing   Problem: Clinical Measurements: Goal: Ability to maintain clinical measurements within normal limits will improve Outcome: Not Progressing Goal: Will remain free from infection Outcome: Not Progressing Goal: Diagnostic test results will improve Outcome: Not Progressing Goal: Respiratory complications will improve Outcome: Not Progressing Goal: Cardiovascular complication will be avoided Outcome: Not Progressing   Problem: Activity: Goal: Risk for activity intolerance will decrease Outcome: Not Progressing   Problem: Nutrition: Goal: Adequate nutrition will be maintained Outcome: Not Progressing   Problem: Coping: Goal: Level of anxiety will decrease Outcome: Not Progressing   Problem: Elimination: Goal: Will not experience complications related to bowel motility Outcome: Not Progressing Goal: Will not experience complications related to urinary retention Outcome: Not Progressing   Problem: Pain Managment: Goal: General experience of comfort will improve and/or be controlled Outcome: Not Progressing   Problem: Safety: Goal: Ability to remain free from injury will improve Outcome: Not Progressing   Problem: Skin Integrity: Goal: Risk for impaired skin integrity will decrease Outcome: Not Progressing   Problem: Education: Goal: Knowledge of disease or condition will improve Outcome: Not Progressing Goal: Understanding of medication regimen will improve Outcome: Not Progressing Goal: Individualized Educational Video(s) Outcome: Not Progressing   Problem: Activity: Goal: Ability to tolerate increased activity will  improve Outcome: Not Progressing   Problem: Cardiac: Goal: Ability to achieve and maintain adequate cardiopulmonary perfusion will improve Outcome: Not Progressing   Problem: Health Behavior/Discharge Planning: Goal: Ability to safely manage health-related needs after discharge will improve Outcome: Not Progressing   Problem: Safety: Goal: Non-violent Restraint(s) Outcome: Not Progressing   "

## 2024-02-13 NOTE — Consult Note (Addendum)
 "  NAME:  Debra Graves, MRN:  [id number], DOB:  [birth year], LOS: 4 ADMISSION DATE:  02/09/2024, CONSULTATION DATE:  02/13/24 REFERRING MD:  [facility], CHIEF COMPLAINT:  Thyrotoxicosis   History of Present Illness:  Roshni Burbano is a 69 yo female with past medical history significant for PAF (on Eliquis ), hyperthyroidism, HLD, CAD, HFpEF, CKD, anxiety who presented to [facility] with AMS after sustaining multiple falls at her home. On arrival to ED, patient was in Afib RVR with HR 150s, and started on diltiazem  gtt. Labs at that time were notable for undetectable TSH, elevated free T4/T3, concerning for thyroid  storm. Treatment was initiated with methimazole , hydrocortisone , SSKI , metoprolol  and she was started empirically on antibiotics. Over the next couple days, patient with progressing encephalopathy/agitation, increased respirations and work of breathing, and developing AKI with UTI. On 1/24, decision was made to transfer patient to [facility] for higher level of care and she was intubated en route for acute hypoxic respiratory failure. PCCM consulted for ICU admission.  Pertinent Medical History:   Past Medical History:  Diagnosis Date   Adrenal insufficiency    Anxiety    Aortic insufficiency    Arthritis    Back pain    CAD (coronary artery disease)    Moderate ostial RCA disease at cardiac catheterization 2006   Constipation    Depression    Fibroids    Hypercholesteremia    Mild carotid artery disease    Rectocele     Significant Hospital Events: Including procedures, antibiotic start and stop dates in addition to other pertinent events   1/21 [facility] thyroid  storm 1/24 Transfer to [facility] ICU, intubated  Interim History / Subjective:  PCCM consulted for ICU admission  Objective    Blood pressure (!) 123/44, pulse 73, temperature 98.3 F (36.8 C), temperature source Axillary, resp. rate (!) 22, height 5' 6 (1.676 m), weight 91.8 kg, SpO2 100%.        Intake/Output Summary (Last 24 hours) at  02/13/2024 2002 Last data filed at 02/13/2024 1800 Gross per 24 hour  Intake 3143.13 ml  Output 100 ml  Net 3043.13 ml   Filed Weights   02/09/24 1948 02/09/24 2336 02/12/24 0456  Weight: 74 kg 93.2 kg 91.8 kg   Examination: On sedation General: acutely-ill elderly woman lying in bed, in NAD, intubated and sedated HEENT: AT/[place], PERRL, 2mm bilaterally Pulm: ETT, ventilator-assisted breaths, CTAB CV: irreg/irreg GI: soft, non distended, Neuro: GCS3TS E1 V1 M1 , intubated sedated   Labs/imaging reviewed  Resolved Problem List:   Assessment and Plan:   Thyroid  storm Acute metabolic encephalopathy 11/20: TSH <0.100; free T4 >5.50; free T3 31.9 11/24: TSH <0.100; free T4 0.61 -Cont: -Metoprolol ->changed to propranolol  60 mg q4 -Hydrocortisone  100mg  tid (received 300mg  loading dose 1/22) -Methimazole  20mg  q6 -Potassium iodide  250mg  q4 -Afebrile; Tylenol  PRN -Trend TSH, free T4/T3 PRN -Trend labs, monitor fever curve  Acute hypoxic respiratory failure in setting of thyroid  storm Sedation in setting of mechanical ventilation -Intubated 1/24 en route to [facility] -LTVV; wean FiO2 to maintain SpO2 >94% -Cont propofol /fent gtt to maintain RASS 0 to -1 -Daily SAT/SBT -CXR with right basilar opacities; c/f aspiration -CXR ABG PRN -Started on zosyn ->changed to rocephin  -Nebs -Trach aspirate ordered  Hx Afib with new onset RVR in setting of thyroid  storm Hypotension in setting of above -Do not use amiodarone as this can worsen symptoms -Prev on dilt; rate controlled; now off -Continue norepinephrine  to maintain MAP >65 -Continue midodrine  10mg  q4 -Cont cardiac monitoring -  Cont apixaban   Hx HFpEF CAD; cath 2006 w/ mod RCA disease -Troponin flat -Cont cardiac monitoring -TTE (05/2022) EF 65%, G1DD -TTE pending  AKI on CKD3a in setting of above -Baseline sCr 0.9-1.1->sCr now 1.78 -Foley in situ -Monitor renal indices -Monitor uop -Cont fluids  Hyperkalemia in setting of  SSKI  -Cont lokelma  -Trend bmp  Leukocytosis; likely reactive to above + steroids-WBC normal on admission; gradual inc Afebrile Continue to trend WBC and fever curve Tylenol  PRN -1/21 [facility] NGTD -1/24 rpt [facility] pending -1/21 UA ; UC Group B strep; ?colonization -MRSA nares neg -Tracheal aspirate ordered -Started on zosyn  at [facility]->changed to rocephin  1/24  Stress-induced hyperglycemia in setting of thyroid  storm and steroids -SSI -CBG  GI ppx: famotidine  Labs:  CBC: Recent Labs  Lab 02/09/24 2046 02/10/24 0056 02/11/24 0454 02/12/24 1428 02/13/24 1123  WBC 12.3* 11.2* 11.9* 17.8* 16.7*  NEUTROABS 8.5* 7.5  --   --   --   HGB 10.3* 9.7* 8.8* 9.5* 9.8*  HCT 33.3* 31.6* 28.9* 30.5* 32.9*  MCV 87.6 88.0 88.9 88.7 91.9  PLT 321 317 294 399 254    Basic Metabolic Panel: Recent Labs  Lab 02/09/24 2046 02/10/24 0056 02/11/24 0454 02/12/24 1428 02/13/24 0630  NA 139 138 138 142 148*  K 3.8 4.0 5.1 5.3* 5.6*  CL 102 101 104 106 112*  CO2 21* 25 23 21* 21*  GLUCOSE 110* 135* 144* 112* 132*  BUN 33* 32* 47* 63* 76*  CREATININE 0.99 1.07* 1.28* 1.32* 1.78*  CALCIUM  10.9* 10.4* 10.4* 9.9 9.2  MG 1.8  --  2.6* 2.6* 2.8*  PHOS  --  5.0*  --   --   --    GFR: Estimated Creatinine Clearance: 34 mL/min (A) (by C-G formula based on SCr of 1.78 mg/dL (H)). Recent Labs  Lab 02/10/24 0056 02/11/24 0454 02/12/24 1428 02/13/24 1123  PROCALCITON  --  0.16  --  0.37  WBC 11.2* 11.9* 17.8* 16.7*    Liver Function Tests: Recent Labs  Lab 02/09/24 2046 02/10/24 0056  AST 59* 54*  ALT 30 28  ALKPHOS 74 102  BILITOT 0.8 0.8  PROT 6.0* 5.5*  ALBUMIN 3.2* 3.8   No results for input(s): LIPASE, AMYLASE in the last 168 hours. Recent Labs  Lab 02/11/24 0454  AMMONIA <13    ABG    Component Value Date/Time   PHART 7.37 02/13/2024 1740   PCO2ART 34 02/13/2024 1740   PO2ART 48 (L) 02/13/2024 1740   HCO3 19.7 (L) 02/13/2024 1740   ACIDBASEDEF 4.8 (H) 02/13/2024 1740    O2SAT 80.2 02/13/2024 1740     Coagulation Profile: No results for input(s): INR, PROTIME in the last 168 hours.  Cardiac Enzymes: Recent Labs  Lab 02/09/24 2046 02/13/24 0630  CKTOTAL 72 538*    HbA1C: No results found for: HGBA1C  CBG: Recent Labs  Lab 02/13/24 0749 02/13/24 1120 02/13/24 1312 02/13/24 1631 02/13/24 2000  GLUCAP 124* 148* 122* 120* 121*    Review of Systems:   Unable to obtain at this time, patient is intubated and sedated  Past Medical History:  She,  has a past medical history of Adrenal insufficiency, Anxiety, Aortic insufficiency, Arthritis, Back pain, CAD (coronary artery disease), Constipation, Depression, Fibroids, Hypercholesteremia, Mild carotid artery disease, and Rectocele.   Surgical History:   Past Surgical History:  Procedure Laterality Date   CARDIAC CATHETERIZATION  03/08/2004   mild progressive CAD   carotid duplex  03/09/2007   rt subclavian  0 to 49%,right bulb 0 to 49 %,lf ICA normal   COLONOSCOPY N/A 06/16/2013   Dr. Shaaron: hyperplastic polyps, melanosis coli    DOPPLER ECHOCARDIOGRAPHY  03/20/2010   EF >55 %,trace mitral regurg,mild tricuspid regurg,aortic valve mildly sclerotic'mild to moderate aortic regurg,trace pulmonic valver regurg   NM MYOCAR PERF WALL MOTION  03/19/06   EF 61%,low risk study     Social History:   reports that she has quit smoking. Her smoking use included cigarettes. She has a 20.5 pack-year smoking history. She has never used smokeless tobacco. She reports that she does not drink alcohol and does not use drugs.   Family History:  Her family history includes Breast cancer in her mother; Breast cancer (age of onset: 1) in her sister; Cancer (age of onset: 63) in her mother; Coronary artery disease in her brother, brother, brother, and father; Heart disease in her father and sister; Hypertension in her sister; Stroke in her brother and father. There is no history of Colon cancer.    Allergies Allergies[1]   Home Medications  Prior to Admission medications  Medication Sig Start Date End Date Taking? Authorizing Provider  clonazePAM  (KLONOPIN ) 0.5 MG tablet Take 1 tablet (0.5 mg total) by mouth daily. 01/27/24  Yes Bevely Doffing, FNP  DULoxetine  (CYMBALTA ) 30 MG capsule Take 30 mg by mouth daily.   Yes [provider]  DULoxetine  (CYMBALTA ) 60 MG capsule Take 60 mg by mouth daily.   Yes [provider]  ELIQUIS  5 MG TABS tablet Take 1 tablet (5 mg total) by mouth 2 (two) times daily. 10/26/23  Yes Debera Jayson MATSU, MD  esomeprazole  (NEXIUM ) 20 MG capsule TAKE ONE CAPSULE BY MOUTH TWICE DAILY BEFORE A MEAL 01/12/24  Yes Ezzard Sonny RAMAN, PA-C  ezetimibe  (ZETIA ) 10 MG tablet Take 1 tablet (10 mg total) by mouth daily. 09/28/23 02/12/24 Yes Debera Jayson MATSU, MD  hydrocortisone  (CORTEF ) 10 MG tablet Take 1 tablet (10 mg total) by mouth 2 (two) times daily. 12/30/23  Yes Aletha Bene, MD  LORazepam  (ATIVAN ) 1 MG tablet Take 1 tablet (1 mg total) by mouth every 8 (eight) hours as needed for anxiety. 01/27/24  Yes Bevely Doffing, FNP  meloxicam (MOBIC) 7.5 MG tablet Take 7.5 mg by mouth 2 (two) times daily. 11/23/18  Yes [provider]  methimazole  (TAPAZOLE ) 5 MG tablet Take 1 tablet (5 mg total) by mouth daily. 12/22/23  Yes Therisa Benton PARAS, NP  metoprolol  succinate (TOPROL -XL) 100 MG 24 hr tablet Take 1 tablet (100 mg total) by mouth daily. Take with or immediately following a meal. 09/28/23 02/12/24 Yes Debera Jayson MATSU, MD  rosuvastatin  (CRESTOR ) 40 MG tablet Take 1 tablet (40 mg total) by mouth daily. 10/23/23  Yes Debera Jayson MATSU, MD  tiZANidine  (ZANAFLEX ) 4 MG tablet Take 4 mg by mouth every 6 (six) hours. 12/30/23  Yes [provider]  Docusate Calcium  (STOOL SOFTENER PO) Take 2 tablets by mouth daily.    [provider]  fish oil-omega-3 fatty acids 1000 MG capsule Take 2 g by mouth daily.    [provider]   furosemide  (LASIX ) 20 MG tablet Take 1 tablet (20 mg total) by mouth 2 (two) times daily. 09/28/23   Debera Jayson MATSU, MD  MAGNESIUM  PO Take by mouth daily.    [provider]  potassium chloride  SA (KLOR-CON  M) 20 MEQ tablet Take 20 mEq by mouth daily. 03/25/23   [provider]  Vitamin D, Ergocalciferol, 2000 units CAPS  Take 1 capsule by mouth daily.    [provider]     Critical care time: 45 minutes   Jessikah Dicker, DNP, AGACNP-BC [facility]  Please see [email or web address] for pager details.  From 7A-7P if no response, please call [phone]. After hours, please call ELink [phone].     [1]  Allergies Allergen Reactions   Albuterol    Bactrim [Sulfamethoxazole-Trimethoprim]    Vytorin [Ezetimibe -Simvastatin]     Myalgias    "

## 2024-02-13 NOTE — Progress Notes (Signed)
 Per Son Christopher Belton to call [facility] with all information when he can not be reached

## 2024-02-13 NOTE — ED Provider Notes (Signed)
 Procedure Name: Intubation Date/Time: 02/13/2024 6:48 PM  Performed by: Bernard Drivers, MDOxygen Delivery Method: Nasal cannula Preoxygenation: Pre-oxygenation with 100% oxygen Induction Type: Rapid sequence Laryngoscope Size: Glidescope Grade View: Grade III Tube size: 7.5 mm Number of attempts: 1 Airway Equipment and Method: Video-laryngoscopy Placement Confirmation: ETT inserted through vocal cords under direct vision, CO2 detector and Breath sounds checked- equal and bilateral Secured at: 22 cm Tube secured with: ETT holder Dental Injury: Teeth and Oropharynx as per pre-operative assessment  Comments: Intubated without difficulty, one attempt, o2 sats 100%, bil breath sounds, co2 color change. Stat pcxr ordered.     Called to intubated by hospitalist, as ICU team/carelink request intbuation prior to transport.   Pt is lethargic/obutnded, o2 sats good on 6 liters [place].   Intubated, RSI etomidate , roc.   Bil bs. Pcxr. O2 sats remain 100%.       Bernard Drivers, MD 02/13/24 [phone]

## 2024-02-13 NOTE — TOC Progression Note (Signed)
 Transition of Care [facility]) - Progression Note    Patient Details  Name: Debra Graves MRN: [id number] Date of Birth: [birth year]  [facility]) CM/SW Contact  Ronnald MARLA Sil, RN Phone Number: 02/13/2024, 4:30 PM  Clinical Narrative:    Patient discussed during Progression rounds this morning, patient remians in Step-down unit, confused with bilateral hand mittens in place, on precedex  gtt.  CM updated FL2 with PASRR # [id number], and attempted to contact family to review lack of SNF bed offers with Son GLENWOOD Harder - [phone] (number incorrect), Sis - Nena (no answer), and Bro - Randy: left voicemail msg requesting call back.  Family may need to consider expanding SNF referrals to neighboring areas to optimize available bed offers  CM team will continue to follow along and assist as appropriate with finalizing discharge plan.   Expected Discharge Plan: Skilled Nursing Facility Barriers to Discharge: Continued Medical Work up               Expected Discharge Plan and Services In-house Referral: Clinical Social Work   Post Acute Care Choice: Skilled Nursing Facility Living arrangements for the past 2 months: Single Family Home                                       Social Drivers of Health (SDOH) Interventions SDOH Screenings   Food Insecurity: Patient Unable To Answer (02/09/2024)  Housing: Unknown (02/09/2024)  Transportation Needs: No Transportation Needs (02/09/2024)  Utilities: Not At Risk (02/09/2024)  Depression (PHQ2-9): Low Risk (01/27/2024)  Social Connections: Unknown (02/09/2024)  Tobacco Use: Medium Risk (02/09/2024)    Readmission Risk Interventions    02/10/2024    2:53 PM  Readmission Risk Prevention Plan  Transportation Screening Complete  Home Care Screening Complete  Medication Review (RN CM) Complete

## 2024-02-13 NOTE — Progress Notes (Addendum)
 "          PROGRESS NOTE  Debra Graves FMW:[id number] DOB: 14-Aug-1955 DOA: 02/13/24 PCP: Bevely Doffing, FNP  Brief History:  69 year old female with a history of paroxysmal atrial fibrillation on apixaban , hyperthyroidism, hypertension, hyperlipidemia, coronary disease, anxiety presenting with multiple falls at home. The patient was confused at the time of presentation and was unable to provide any history.  In the ED, the patient was tachycardic with atrial fibrillation with RVR with heart rates of 150s.  The patient was afebrile and hemodynamically stable.  She was started on diltiazem  drip.  The patient's HR was better controlled, but would have intermittent episodes of RVR in the setting of her agitation.  The patient remained agitated As result, more aggressive treatment for thyrotoxicosis was started. SSKI , hydrocortisone , metoprolol , and Tapazole  were started. The patient's mental status continued to decline.  She required NG tube placement to receive her medications.  She was started on a Precedex  drip which did help decrease her heart rate.  However, the drip had to be decreased secondary to bradycardia.  Patient remained agitated and required intermittent dosing Ativan .  In the interim, her mental status continued to worsen with new oxygen requirement and worsening serum creatinine. In the setting of worsening multisystem failure, critical care medicine was consulted.   Assessment/Plan: Atrial fibrillation with RVR -Continue diltiazem  drip>>weaned off -1/23 restarted diltiazem  due to agitation - Restart metoprolol  succinate (IV lopressor  if not tolerating po) - Continue apixaban    Thyrotoxicosis - 12/14/2023 TSH <0.100 -12/14/2023 free T4---0.61 -12/14/2023 thyroid  functions were improving with regard to her hyperthyroidism when compared to prior studies - Feb 13, 2024 TSH <0.100 - Feb 13, 2024 Free T4 >5.50 - Increase methimazole  to 20 mg q 6 hrs - Increase Solu-Cortef  to 100  mg TID - increase SSKI  250 mg every 6 hours - NG tube placed a.m. 02/13/2024 secondary to worsening mental status - repeat TSH, Free T4 - check Free T3   Acute metabolic encephalopathy -Patient's mental status continues to gradually decline with continued agitation - B12--666 - Folic acid --10.2 - Ammonia <13 - VBG 7.39/43/43/26 - CT brain--neg - UA 11-20 WBC>> send urine culture - Send urine culture>>Group B strep - continue ceftriaxone  - PDMP reviewed-- -Ativan  1 mg, #90, last refill 01/27/2024 - Klonopin  0.5 mg, #30, last refill 01/27/2024 - increase ativan  to 2 mg prn agitation - 02/13/2024 precedex  drip started  Acute respiratory failure with hypoxia -Check ABG -Obtain chest x-ray -Start empiric Zosyn  to cover for aspiration pneumonitis  AKI on CKD 3A -baseline creatinine 0.9-1.1 -Serum creatinine increased up to 1.78 -Continue IV fluids -Renal ultrasound  Hyperkalemia -In part due to the patient's SSKI  -Give Lokelma  TID -Continue IV fluids -Monitor electrolytes   UTI -UA 11-20 WBC -culture = Group B strept -initially on ceftriaxone     Leukemoid reaction - Likely stress demargination and steroids - Start empiric zosyn  - initial blood cultures--neg - repeat blood culture   Chronic HFpEF - 06/13/2022 echo EF 65 to 70%, grade 1 DD - clinically compensated   Anxiety - Patient is on baseline Ativan  and Klonopin  at home - PDMP reviewed as discussed above   Generalized weakness and multiple falls - PT evaluation once patient stable          Family Communication:  son bedside 1/23   Consultants:  cardiology, PCCM   Code Status:  FULL    DVT Prophylaxis:  apixaban      Procedures: As Listed in Progress Note Above   Antibiotics: Ceftriaxone  1/21>>1/24  Zosyn  1/24>>   The patient is critically ill with multiple organ systems failure and requires high complexity decision making for assessment and support, frequent evaluation and titration of therapies,  application of advanced monitoring technologies and extensive interpretation of multiple databases.  Critical care time - 40 mins.     Subjective: Review of systems not possible.  Patient remains agitated.  Objective: Vitals:   02/13/24 0831 02/13/24 0832 02/13/24 0900 02/13/24 0930  BP: (!) 89/39 (!) 89/39 (!) 88/27 (!) 109/40  Pulse: 71  68 (!) 54  Resp: (!) 30 (!) 24 (!) 24 (!) 23  Temp:      TempSrc:      SpO2: 95%  91% 93%  Weight:      Height:        Intake/Output Summary (Last 24 hours) at 02/13/2024 1105 Last data filed at 02/13/2024 0930 Gross per 24 hour  Intake 2502.01 ml  Output 150 ml  Net 2352.01 ml   Weight change:  Exam:  General:  Pt is agitated, does not follow commands appropriately HEENT: No icterus, No thrush, No neck mass, [place]/AT Cardiovascular: RRR, S1/S2, no rubs, no gallops Respiratory: Bilateral rales. Abdomen: Soft/+BS, non tender, non distended, no guarding Extremities: Nonptting edema, No lymphangitis, No petechiae, No rashes, no synovitis   Data Reviewed: I have personally reviewed following labs and imaging studies Basic Metabolic Panel: Recent Labs  Lab 02/09/24 2046 02/10/24 0056 02/11/24 0454 02/12/24 1428 02/13/24 0630  NA 139 138 138 142 148*  K 3.8 4.0 5.1 5.3* 5.6*  CL 102 101 104 106 112*  CO2 21* 25 23 21* 21*  GLUCOSE 110* 135* 144* 112* 132*  BUN 33* 32* 47* 63* 76*  CREATININE 0.99 1.07* 1.28* 1.32* 1.78*  CALCIUM  10.9* 10.4* 10.4* 9.9 9.2  MG 1.8  --  2.6* 2.6* 2.8*  PHOS  --  5.0*  --   --   --    Liver Function Tests: Recent Labs  Lab 02/09/24 2046 02/10/24 0056  AST 59* 54*  ALT 30 28  ALKPHOS 74 102  BILITOT 0.8 0.8  PROT 6.0* 5.5*  ALBUMIN 3.2* 3.8   No results for input(s): LIPASE, AMYLASE in the last 168 hours. Recent Labs  Lab 02/11/24 0454  AMMONIA <13   Coagulation Profile: No results for input(s): INR, PROTIME in the last 168 hours. CBC: Recent Labs  Lab 02/09/24 2046  02/10/24 0056 02/11/24 0454 02/12/24 1428  WBC 12.3* 11.2* 11.9* 17.8*  NEUTROABS 8.5* 7.5  --   --   HGB 10.3* 9.7* 8.8* 9.5*  HCT 33.3* 31.6* 28.9* 30.5*  MCV 87.6 88.0 88.9 88.7  PLT 321 317 294 399   Cardiac Enzymes: Recent Labs  Lab 02/09/24 2046 02/13/24 0630  CKTOTAL 72 538*   BNP: Invalid input(s): POCBNP CBG: Recent Labs  Lab 02/09/24 2056 02/12/24 2154 02/12/24 2339 02/13/24 0320 02/13/24 0749  GLUCAP 108* 94 98 126* 124*   HbA1C: No results for input(s): HGBA1C in the last 72 hours. Urine analysis:    Component Value Date/Time   COLORURINE YELLOW 02/10/2024 0705   APPEARANCEUR CLOUDY (A) 02/10/2024 0705   LABSPEC 1.017 02/10/2024 0705   PHURINE 5.0 02/10/2024 0705   GLUCOSEU NEGATIVE 02/10/2024 0705   HGBUR NEGATIVE 02/10/2024 0705   BILIRUBINUR NEGATIVE 02/10/2024 0705   BILIRUBINUR small (A) 12/16/2022 1620   KETONESUR NEGATIVE 02/10/2024 0705   PROTEINUR 30 (A) 02/10/2024 0705   UROBILINOGEN 0.2 12/16/2022 1620   NITRITE NEGATIVE 02/10/2024 0705  LEUKOCYTESUR TRACE (A) 02/10/2024 0705   Sepsis Labs: @LABRCNTIP (procalcitonin:4,lacticidven:4) ) Recent Results (from the past 240 hours)  Culture, blood (Routine X 2) w Reflex to ID Panel     Status: None (Preliminary result)   Collection Time: 02/10/24 12:56 AM   Specimen: BLOOD  Result Value Ref Range Status   Specimen Description BLOOD BLOOD LEFT ARM  Final   Special Requests   Final    BOTTLES DRAWN AEROBIC AND ANAEROBIC Blood Culture adequate volume   Culture   Final    NO GROWTH 3 DAYS Performed at [facility], [address], [place], [place] [address]    Report Status PENDING  Incomplete  Culture, blood (Routine X 2) w Reflex to ID Panel     Status: None (Preliminary result)   Collection Time: 02/10/24 12:56 AM   Specimen: BLOOD  Result Value Ref Range Status   Specimen Description BLOOD BLOOD LEFT ARM  Final   Special Requests   Final    BOTTLES DRAWN AEROBIC AND ANAEROBIC  Blood Culture adequate volume   Culture   Final    NO GROWTH 3 DAYS Performed at [facility], [address] [place], [place] [address]    Report Status PENDING  Incomplete  Urine Culture (for pregnant, neutropenic or urologic patients or patients with an indwelling urinary catheter)     Status: Abnormal   Collection Time: 02/10/24  7:05 AM   Specimen: Urine, Catheterized  Result Value Ref Range Status   Specimen Description   Final    URINE, CATHETERIZED Performed at [facility], [address], [place], [place] [address]    Special Requests   Final    NONE Performed at [facility], [address], [place], [place] [address]    Culture (A)  Final    20,000 COLONIES/mL GROUP B STREP(S.AGALACTIAE)ISOLATED TESTING AGAINST S. AGALACTIAE NOT ROUTINELY PERFORMED DUE TO PREDICTABILITY OF AMP/PEN/VAN SUSCEPTIBILITY. Performed at [facility], [address] [place], [place] [address]    Report Status 02/11/2024 FINAL  Final  MRSA Next Gen by PCR, Nasal     Status: None   Collection Time: 02/10/24 11:10 AM   Specimen: Nasal Mucosa; Nasal Swab  Result Value Ref Range Status   MRSA by PCR Next Gen NOT DETECTED NOT DETECTED Final    Comment: (NOTE) The GeneXpert MRSA Assay (FDA approved for NASAL specimens only), is one component of a comprehensive MRSA colonization surveillance program. It is not intended to diagnose MRSA infection nor to guide or monitor treatment for MRSA infections. Test performance is not FDA approved in patients less than 61 years old. Performed at [facility], [address], [place], [place] [address]      Scheduled Meds:  apixaban   5 mg Oral BID   Chlorhexidine  Gluconate Cloth  6 each Topical Daily   hydrocortisone  sod succinate (SOLU-CORTEF ) inj  100 mg Intravenous TID   methimazole   20 mg Oral Q6H   metoprolol  tartrate  25 mg Oral QID   potassium iodide   250 mg Oral QID   rosuvastatin   40 mg Oral Daily   Continuous Infusions:  sodium chloride   75 mL/hr at 02/13/24 0930   dexmedetomidine  (PRECEDEX ) IV infusion 0.2 mcg/kg/hr (02/13/24 1048)   diltiazem  (CARDIZEM ) infusion Stopped (02/13/24 0604)   piperacillin -tazobactam (ZOSYN )  IV      Procedures/Studies: DG Abd Portable 1V Result Date: 02/12/2024 EXAM: 1 VIEW XRAY OF THE ABDOMEN 02/12/2024 11:06:28 PM COMPARISON: CT abdomen and pelvis for 05/06/2016. CLINICAL HISTORY:  Encounter for nasogastric tube placement. ICD10: 747668 Encounter for nasogastric tube placement. FINDINGS: LINES, TUBES AND DEVICES: Enteric tube in place with distal tip and side port terminating within the expected location of the proximal stomach. BOWEL: Nonobstructive bowel gas pattern. SOFT TISSUES: No abnormal calcifications. BONES: No acute fracture. LIMITATIONS/ARTIFACTS: The patient is rotated. IMPRESSION: 1. Enteric tube tip and side port terminate within the proximal stomach. Electronically signed by: Morgane Naveau MD 02/12/2024 11:12 PM EST RP Workstation: [id number]   CT Cervical Spine Wo Contrast Result Date: 02/09/2024 EXAM: CT CERVICAL SPINE WITHOUT CONTRAST 02/09/2024 08:47:36 PM TECHNIQUE: CT of the cervical spine was performed without the administration of intravenous contrast. Multiplanar reformatted images are provided for review. Automated exposure control, iterative reconstruction, and/or weight based adjustment of the mA/kV was utilized to reduce the radiation dose to as low as reasonably achievable. COMPARISON: None available. CLINICAL HISTORY: Polytrauma, blunt FINDINGS: BONES AND ALIGNMENT: No acute fracture or traumatic malalignment. DEGENERATIVE CHANGES: No significant degenerative changes. SOFT TISSUES: No prevertebral soft tissue swelling. IMPRESSION: 1. No significant abnormality. Electronically signed by: Pinkie Pebbles MD 02/09/2024 08:51 PM EST RP Workstation: [id number]   CT Head Wo Contrast Result Date: 02/09/2024 EXAM: CT HEAD WITHOUT CONTRAST 02/09/2024 08:47:36 PM TECHNIQUE: CT of  the head was performed without the administration of intravenous contrast. Automated exposure control, iterative reconstruction, and/or weight based adjustment of the mA/kV was utilized to reduce the radiation dose to as low as reasonably achievable. COMPARISON: None available. CLINICAL HISTORY: Polytrauma, blunt. FINDINGS: BRAIN AND VENTRICLES: No acute hemorrhage. No evidence of acute infarct. No hydrocephalus. No extra-axial collection. No mass effect or midline shift. Bilateral basal ganglia mineralization. ORBITS: No acute abnormality. SINUSES: No acute abnormality. SOFT TISSUES AND SKULL: No acute soft tissue abnormality. No skull fracture. VASCULATURE: Atherosclerotic calcifications of carotid siphons and vertebral arteries. IMPRESSION: 1. No acute intracranial abnormality. Electronically signed by: Pinkie Pebbles MD 02/09/2024 08:50 PM EST RP Workstation: [id number]   DG Chest Portable 1 View Result Date: 02/09/2024 EXAM: 1 VIEW XRAY OF THE CHEST 02/09/2024 08:38:55 PM COMPARISON: 03/20/2023 CLINICAL HISTORY: Fall FINDINGS: LUNGS AND PLEURA: No focal pulmonary opacity. No pleural effusion. No pneumothorax. HEART AND MEDIASTINUM: No acute abnormality of the cardiac and mediastinal silhouettes. BONES AND SOFT TISSUES: No acute osseous abnormality. IMPRESSION: 1. No acute process. Electronically signed by: Pinkie Pebbles MD 02/09/2024 08:46 PM EST RP Workstation: [id number]   DG Pelvis Portable Result Date: 02/09/2024 EXAM: 1 Views Xray of the Pelvis 02/09/2024 08:38:55 PM COMPARISON: None available. CLINICAL HISTORY: Fall FINDINGS: BONES AND JOINTS: No acute fracture. No malalignment. Hemisacralization of the right L5 vertebral body. SOFT TISSUES: Unremarkable. IMPRESSION: 1. No evidence of acute traumatic injury. Electronically signed by: Pinkie Pebbles MD 02/09/2024 08:45 PM EST RP Workstation: [id number]   DG Lumbar Spine Complete Result Date: 02/09/2024 EXAM: 4 Views Xray of the Lumbar Spine  02/09/2024 08:38:55 PM COMPARISON: None available. CLINICAL HISTORY: Fall FINDINGS: LUMBAR SPINE: BONES: Vertebral body heights are maintained. Alignment is normal. Grade 1 anterolisthesis of L4 on L5. DISCS AND DEGENERATIVE CHANGES: Mild degenerative changes of the lower lumbar spine. SOFT TISSUES: No acute abnormality. IMPRESSION: 1. No traumatic injury. 2. Mild degenerative changes of the lower lumbar spine. Electronically signed by: Pinkie Pebbles MD 02/09/2024 08:45 PM EST RP Workstation: [id number]    Alm Schneider, DO  [facility]  If 7PM-7AM, please contact night-coverage [email or web address] Password [email or web address] 02/13/2024, 11:05 AM   LOS: 4 days  s "

## 2024-02-13 NOTE — Progress Notes (Signed)
 History of PAF, issues with afib with RVR this admission in setting of hyperthyroidism, TSH undetectable since 11/2023, free T4 this admit >5.50. Rate control has been complicated by AMS and agitation, has not been consistent with oral meds. Also some issues with low bp's overnight. Was in sinus tach yesterday in setting of significant agitation. Low bp's noted overnight, if recurrent afib with RVR issues and low bp's could initiate amiodarone drip, rates reported through epic currently 60s to 70s. Agitation got to point required precedex  drip, managed by primary team     Debra Ross MD

## 2024-02-13 NOTE — Progress Notes (Signed)
 1823 The patient was intubated by Dr. Bernard . ETT placement noted by BBS and color change on CO detector. The patient was ventilated with ambu bag until ETT placement was confirmed by x-ray and [facility] was ready to place patient on their ventilator.

## 2024-02-13 NOTE — Significant Event (Signed)
 I was called by transfer RN as the patient was tachypneic.  Per RN the patient was breathing in 50s and increased oxygen need from 3 L to 9 L.  She was going on and off Precedex  because of bradycardia.  She ended up getting Ativan  for agitation and now getting Geodon .  Blood gas was drawn and pending. I briefly spoke to the transferring attending.  Requested him to gauge her respiratory status.  Did provide input that if the patient is looking even slightly tachypneic and concern for respiratory decompensation in transit it is better to have a secure airway for transfer.  Tentative plan for intubation prior to transfer of the patient to the hospital.

## 2024-02-13 NOTE — Progress Notes (Addendum)
 0800 patient doesn't respond to any commands in bilateral restraints with precedex  running, patient will move arms and legs in bed with stimulation but not to any commands.  0900 Precedex  off because heart rate decreased and BP decreased  1000 patient agitated in bed not following command plan is to restart precedex  half starting dosage. Meds given via NG tube  1230 bladder scanned 30ml noted  1823 patient intubated   1858 patient being transported to [facility] room 4 report called to RN already

## 2024-02-13 NOTE — Progress Notes (Signed)
 I was called to see the patient by the [facility] transfer team secondary to concerns of her agitation and respiratory status  The patient had been receiving Precedex  at 0.2 mcg/kg/min.  She has some difficulty with tolerance secondary to soft blood pressures.  Oxygen demand has increased up to 9 L. There was concern for possible decompensation during the transport with regard to the patient's respiratory status Case was discussed with PCCM, Dr. Theodoro whom did recommend to err on the side of securing airway.  The patient was given Ativan  2 mg IV.  She remained agitated.  She continued to be tachypneic.  ABG was obtained  7.3 7/34/48/19 on 50% FiO2  I spoke with Dr. Franky Gaul.  He assisted in intubating patient  Patient's son was updated  Alm Faria Casella, DO

## 2024-02-14 ENCOUNTER — Inpatient Hospital Stay (HOSPITAL_COMMUNITY)

## 2024-02-14 DIAGNOSIS — I2510 Atherosclerotic heart disease of native coronary artery without angina pectoris: Secondary | ICD-10-CM | POA: Diagnosis not present

## 2024-02-14 DIAGNOSIS — R579 Shock, unspecified: Secondary | ICD-10-CM | POA: Diagnosis not present

## 2024-02-14 DIAGNOSIS — I4891 Unspecified atrial fibrillation: Secondary | ICD-10-CM | POA: Diagnosis not present

## 2024-02-14 DIAGNOSIS — E8729 Other acidosis: Secondary | ICD-10-CM

## 2024-02-14 DIAGNOSIS — G9341 Metabolic encephalopathy: Secondary | ICD-10-CM | POA: Diagnosis not present

## 2024-02-14 DIAGNOSIS — E0591 Thyrotoxicosis, unspecified with thyrotoxic crisis or storm: Secondary | ICD-10-CM | POA: Diagnosis not present

## 2024-02-14 DIAGNOSIS — J9601 Acute respiratory failure with hypoxia: Secondary | ICD-10-CM | POA: Diagnosis not present

## 2024-02-14 DIAGNOSIS — N1831 Chronic kidney disease, stage 3a: Secondary | ICD-10-CM | POA: Diagnosis not present

## 2024-02-14 DIAGNOSIS — N179 Acute kidney failure, unspecified: Secondary | ICD-10-CM | POA: Diagnosis not present

## 2024-02-14 DIAGNOSIS — E875 Hyperkalemia: Secondary | ICD-10-CM | POA: Diagnosis not present

## 2024-02-14 LAB — CBC
HCT: 34.6 % — ABNORMAL LOW (ref 36.0–46.0)
Hemoglobin: 10.5 g/dL — ABNORMAL LOW (ref 12.0–15.0)
MCH: 27.4 pg (ref 26.0–34.0)
MCHC: 30.3 g/dL (ref 30.0–36.0)
MCV: 90.3 fL (ref 80.0–100.0)
Platelets: 247 10*3/uL (ref 150–400)
RBC: 3.83 MIL/uL — ABNORMAL LOW (ref 3.87–5.11)
RDW: 14.8 % (ref 11.5–15.5)
WBC: 23.5 10*3/uL — ABNORMAL HIGH (ref 4.0–10.5)
nRBC: 1.5 % — ABNORMAL HIGH (ref 0.0–0.2)

## 2024-02-14 LAB — POCT I-STAT 7, (LYTES, BLD GAS, ICA,H+H)
Acid-base deficit: 7 mmol/L — ABNORMAL HIGH (ref 0.0–2.0)
Bicarbonate: 20.3 mmol/L (ref 20.0–28.0)
Calcium, Ion: 1.2 mmol/L (ref 1.15–1.40)
HCT: 31 % — ABNORMAL LOW (ref 36.0–46.0)
Hemoglobin: 10.5 g/dL — ABNORMAL LOW (ref 12.0–15.0)
O2 Saturation: 90 %
Potassium: 4.9 mmol/L (ref 3.5–5.1)
Sodium: 142 mmol/L (ref 135–145)
TCO2: 22 mmol/L (ref 22–32)
pCO2 arterial: 47.5 mmHg (ref 32–48)
pH, Arterial: 7.24 — ABNORMAL LOW (ref 7.35–7.45)
pO2, Arterial: 69 mmHg — ABNORMAL LOW (ref 83–108)

## 2024-02-14 LAB — GLUCOSE, CAPILLARY
Glucose-Capillary: 104 mg/dL — ABNORMAL HIGH (ref 70–99)
Glucose-Capillary: 143 mg/dL — ABNORMAL HIGH (ref 70–99)
Glucose-Capillary: 152 mg/dL — ABNORMAL HIGH (ref 70–99)
Glucose-Capillary: 157 mg/dL — ABNORMAL HIGH (ref 70–99)
Glucose-Capillary: 169 mg/dL — ABNORMAL HIGH (ref 70–99)
Glucose-Capillary: 177 mg/dL — ABNORMAL HIGH (ref 70–99)

## 2024-02-14 LAB — CBC WITH DIFFERENTIAL/PLATELET
Abs Immature Granulocytes: 0.24 10*3/uL — ABNORMAL HIGH (ref 0.00–0.07)
Basophils Absolute: 0.1 10*3/uL (ref 0.0–0.1)
Basophils Relative: 0 %
Eosinophils Absolute: 0 10*3/uL (ref 0.0–0.5)
Eosinophils Relative: 0 %
HCT: 36.1 % (ref 36.0–46.0)
Hemoglobin: 11.2 g/dL — ABNORMAL LOW (ref 12.0–15.0)
Immature Granulocytes: 1 %
Lymphocytes Relative: 3 %
Lymphs Abs: 0.7 10*3/uL (ref 0.7–4.0)
MCH: 27.8 pg (ref 26.0–34.0)
MCHC: 31 g/dL (ref 30.0–36.0)
MCV: 89.6 fL (ref 80.0–100.0)
Monocytes Absolute: 0.8 10*3/uL (ref 0.1–1.0)
Monocytes Relative: 3 %
Neutro Abs: 24.1 10*3/uL — ABNORMAL HIGH (ref 1.7–7.7)
Neutrophils Relative %: 93 %
Platelets: 259 10*3/uL (ref 150–400)
RBC: 4.03 MIL/uL (ref 3.87–5.11)
RDW: 14.7 % (ref 11.5–15.5)
Smear Review: NORMAL
WBC: 25.9 10*3/uL — ABNORMAL HIGH (ref 4.0–10.5)
nRBC: 2 % — ABNORMAL HIGH (ref 0.0–0.2)

## 2024-02-14 LAB — LACTIC ACID, PLASMA
Lactic Acid, Venous: 2.6 mmol/L (ref 0.5–1.9)
Lactic Acid, Venous: 2.5 mmol/L (ref 0.5–1.9)
Lactic Acid, Venous: 2.9 mmol/L (ref 0.5–1.9)

## 2024-02-14 LAB — COMPREHENSIVE METABOLIC PANEL WITH GFR
ALT: 1501 U/L — ABNORMAL HIGH (ref 0–44)
ALT: 1508 U/L — ABNORMAL HIGH (ref 0–44)
AST: 2201 U/L — ABNORMAL HIGH (ref 15–41)
AST: 2016 U/L — ABNORMAL HIGH (ref 15–41)
Albumin: 2.6 g/dL — ABNORMAL LOW (ref 3.5–5.0)
Albumin: 2.7 g/dL — ABNORMAL LOW (ref 3.5–5.0)
Alkaline Phosphatase: 104 U/L (ref 38–126)
Alkaline Phosphatase: 118 U/L (ref 38–126)
Anion gap: 15 (ref 5–15)
Anion gap: 17 — ABNORMAL HIGH (ref 5–15)
BUN: 94 mg/dL — ABNORMAL HIGH (ref 8–23)
BUN: 95 mg/dL — ABNORMAL HIGH (ref 8–23)
CO2: 16 mmol/L — ABNORMAL LOW (ref 22–32)
CO2: 15 mmol/L — ABNORMAL LOW (ref 22–32)
Calcium: 8.1 mg/dL — ABNORMAL LOW (ref 8.9–10.3)
Calcium: 8.1 mg/dL — ABNORMAL LOW (ref 8.9–10.3)
Chloride: 105 mmol/L (ref 98–111)
Chloride: 104 mmol/L (ref 98–111)
Creatinine, Ser: 2.56 mg/dL — ABNORMAL HIGH (ref 0.44–1.00)
Creatinine, Ser: 2.88 mg/dL — ABNORMAL HIGH (ref 0.44–1.00)
GFR, Estimated: 20 mL/min — ABNORMAL LOW
GFR, Estimated: 17 mL/min — ABNORMAL LOW
Glucose, Bld: 174 mg/dL — ABNORMAL HIGH (ref 70–99)
Glucose, Bld: 165 mg/dL — ABNORMAL HIGH (ref 70–99)
Potassium: 4.8 mmol/L (ref 3.5–5.1)
Potassium: 5.3 mmol/L — ABNORMAL HIGH (ref 3.5–5.1)
Sodium: 137 mmol/L (ref 135–145)
Sodium: 137 mmol/L (ref 135–145)
Total Bilirubin: 1 mg/dL (ref 0.0–1.2)
Total Bilirubin: 1 mg/dL (ref 0.0–1.2)
Total Protein: 5.3 g/dL — ABNORMAL LOW (ref 6.5–8.1)
Total Protein: 5.6 g/dL — ABNORMAL LOW (ref 6.5–8.1)

## 2024-02-14 LAB — DIC (DISSEMINATED INTRAVASCULAR COAGULATION)PANEL
D-Dimer, Quant: 2.41 ug{FEU}/mL — ABNORMAL HIGH (ref 0.00–0.50)
Fibrinogen: 432 mg/dL (ref 210–475)
INR: 5.6 (ref 0.8–1.2)
Platelets: 261 10*3/uL (ref 150–400)
Prothrombin Time: 52.9 s — ABNORMAL HIGH (ref 11.4–15.2)
Smear Review: NONE SEEN
aPTT: 38 s — ABNORMAL HIGH (ref 24–36)

## 2024-02-14 LAB — ECHOCARDIOGRAM COMPLETE
AR max vel: 1.89 cm2
AV Area VTI: 1.89 cm2
AV Area mean vel: 1.61 cm2
AV Mean grad: 11 mmHg
AV Peak grad: 20.6 mmHg
Ao pk vel: 2.27 m/s
Calc EF: 58.7 %
Height: 65.984 in
P 1/2 time: 138 ms
S' Lateral: 3.2 cm
Single Plane A2C EF: 60.4 %
Single Plane A4C EF: 56.8 %
Weight: 3470.92 [oz_av]

## 2024-02-14 LAB — TROPONIN T, HIGH SENSITIVITY
Troponin T High Sensitivity: 130 ng/L (ref 0–19)
Troponin T High Sensitivity: 137 ng/L (ref 0–19)
Troponin T High Sensitivity: 154 ng/L (ref 0–19)

## 2024-02-14 LAB — TSH
TSH: <0.1 u[IU]/mL — ABNORMAL LOW (ref 0.350–4.500)
TSH: <0.1 u[IU]/mL — ABNORMAL LOW (ref 0.350–4.500)

## 2024-02-14 LAB — APTT
aPTT: 39 s — ABNORMAL HIGH (ref 24–36)
aPTT: 63 s — ABNORMAL HIGH (ref 24–36)

## 2024-02-14 LAB — CK: Total CK: 848 U/L — ABNORMAL HIGH (ref 38–234)

## 2024-02-14 LAB — TRIGLYCERIDES: Triglycerides: 131 mg/dL

## 2024-02-14 LAB — HEMOGLOBIN A1C
Hgb A1c MFr Bld: 5.9 % — ABNORMAL HIGH (ref 4.8–5.6)
Mean Plasma Glucose: 122.63 mg/dL

## 2024-02-14 LAB — MAGNESIUM: Magnesium: 2.7 mg/dL — ABNORMAL HIGH (ref 1.7–2.4)

## 2024-02-14 LAB — HEPARIN LEVEL (UNFRACTIONATED): Heparin Unfractionated: >1.1 [IU]/mL — ABNORMAL HIGH (ref 0.30–0.70)

## 2024-02-14 LAB — T4, FREE: Free T4: 2.04 ng/dL — ABNORMAL HIGH (ref 0.80–2.00)

## 2024-02-14 LAB — PHOSPHORUS: Phosphorus: 5.9 mg/dL — ABNORMAL HIGH (ref 2.5–4.6)

## 2024-02-14 MED ORDER — POTASSIUM IODIDE (EXPECTORANT) 1 GM/ML PO SOLN
250.0000 mg | Freq: Four times a day (QID) | ORAL | Status: DC
  Administered 2024-02-14 – 2024-02-15 (×5): 250 mg
  Filled 2024-02-14 (×7): qty 0.25

## 2024-02-14 MED ORDER — THIAMINE MONONITRATE 100 MG PO TABS
100.0000 mg | ORAL_TABLET | Freq: Every day | ORAL | Status: DC
  Administered 2024-02-14 – 2024-02-15 (×2): 100 mg
  Filled 2024-02-14 (×2): qty 1

## 2024-02-14 MED ORDER — ORAL CARE MOUTH RINSE
15.0000 mL | OROMUCOSAL | Status: DC
  Administered 2024-02-14 – 2024-02-15 (×17): 15 mL via OROMUCOSAL

## 2024-02-14 MED ORDER — FAMOTIDINE 20 MG PO TABS
20.0000 mg | ORAL_TABLET | Freq: Every day | ORAL | Status: DC
  Administered 2024-02-14: 20 mg
  Filled 2024-02-14: qty 1

## 2024-02-14 MED ORDER — PROSOURCE TF20 ENFIT COMPATIBL EN LIQD
60.0000 mL | Freq: Three times a day (TID) | ENTERAL | Status: DC
  Administered 2024-02-14 – 2024-02-15 (×4): 60 mL
  Filled 2024-02-14 (×5): qty 60

## 2024-02-14 MED ORDER — ACETAMINOPHEN 325 MG PO TABS: 650.0000 mg | ORAL_TABLET | Freq: Four times a day (QID) | ORAL | Status: DC | PRN

## 2024-02-14 MED ORDER — OSMOLITE 1.5 CAL PO LIQD: 1000.0000 mL | ORAL | Status: DC

## 2024-02-14 MED ORDER — CLONAZEPAM 0.5 MG PO TABS: 0.5000 mg | ORAL_TABLET | Freq: Every day | ORAL | Status: DC | PRN

## 2024-02-14 MED ORDER — HEPARIN (PORCINE) 25000 UT/250ML-% IV SOLN
1100.0000 [IU]/h | INTRAVENOUS | Status: DC
  Administered 2024-02-14: 1000 [IU]/h via INTRAVENOUS
  Filled 2024-02-14: qty 250

## 2024-02-14 MED ORDER — PROSOURCE TF20 ENFIT COMPATIBL EN LIQD
60.0000 mL | Freq: Two times a day (BID) | ENTERAL | Status: DC
  Administered 2024-02-14: 60 mL
  Filled 2024-02-14: qty 60

## 2024-02-14 MED ORDER — SODIUM CHLORIDE 0.45 % IV BOLUS
500.0000 mL | Freq: Once | INTRAVENOUS | Status: AC
  Administered 2024-02-14: 500 mL via INTRAVENOUS

## 2024-02-14 MED ORDER — MIDODRINE HCL 5 MG PO TABS
10.0000 mg | ORAL_TABLET | Freq: Three times a day (TID) | ORAL | Status: DC
  Administered 2024-02-14 – 2024-02-15 (×4): 10 mg
  Filled 2024-02-14 (×5): qty 2

## 2024-02-14 MED ORDER — OSMOLITE 1.5 CAL PO LIQD
1000.0000 mL | ORAL | Status: DC
  Administered 2024-02-14: 1000 mL

## 2024-02-14 MED ORDER — METHIMAZOLE 10 MG PO TABS
20.0000 mg | ORAL_TABLET | Freq: Four times a day (QID) | ORAL | Status: DC
  Administered 2024-02-14 – 2024-02-15 (×6): 20 mg
  Filled 2024-02-14 (×8): qty 2

## 2024-02-14 MED ORDER — ADULT MULTIVITAMIN W/MINERALS CH
1.0000 | ORAL_TABLET | Freq: Every day | ORAL | Status: DC
  Administered 2024-02-14 – 2024-02-15 (×2): 1
  Filled 2024-02-14 (×2): qty 1

## 2024-02-14 MED ORDER — MIDODRINE HCL 5 MG PO TABS: 10.0000 mg | ORAL_TABLET | Freq: Three times a day (TID) | ORAL | Status: DC

## 2024-02-14 MED ORDER — APIXABAN 5 MG PO TABS: 5.0000 mg | ORAL_TABLET | Freq: Two times a day (BID) | ORAL | Status: DC

## 2024-02-14 MED ORDER — SODIUM ZIRCONIUM CYCLOSILICATE 10 G PO PACK
10.0000 g | PACK | Freq: Three times a day (TID) | ORAL | Status: AC
  Administered 2024-02-14 – 2024-02-15 (×3): 10 g
  Filled 2024-02-14 (×3): qty 1

## 2024-02-14 MED ORDER — POLYETHYLENE GLYCOL 3350 17 G PO PACK: 17.0000 g | PACK | Freq: Every day | ORAL | Status: DC | PRN

## 2024-02-14 MED ORDER — PROPRANOLOL HCL 60 MG PO TABS
60.0000 mg | ORAL_TABLET | ORAL | Status: DC
  Administered 2024-02-14 – 2024-02-15 (×6): 60 mg
  Filled 2024-02-14 (×7): qty 1

## 2024-02-14 MED ORDER — ROSUVASTATIN CALCIUM 20 MG PO TABS
20.0000 mg | ORAL_TABLET | Freq: Every day | ORAL | Status: DC
  Administered 2024-02-14: 20 mg
  Filled 2024-02-14: qty 1

## 2024-02-14 MED ORDER — ORAL CARE MOUTH RINSE: 15.0000 mL | OROMUCOSAL | Status: DC | PRN

## 2024-02-14 NOTE — Progress Notes (Signed)
 PHARMACY - ANTICOAGULATION CONSULT NOTE  Pharmacy Consult:  Heparin  Indication: atrial fibrillation  Allergies[1]  Patient Measurements: Height: 5' 5.98 (167.6 cm) Weight: 98.4 kg (216 lb 14.9 oz) IBW/kg (Calculated) : 59.26 HEPARIN  DW (KG): 80.2  Vital Signs: Temp: 98 F (36.7 C) (01/25 2000) Temp Source: Axillary (01/25 2000) BP: 122/63 (01/25 2008) Pulse Rate: 80 (01/25 2008)  Labs: Recent Labs    02/12/24 1428 02/13/24 0630 02/13/24 1123 02/13/24 2131 02/13/24 2356 02/14/24 0732 02/14/24 1057 02/14/24 2249  HGB 9.5*  --  9.8* 9.2* 10.5*  --  11.2*  --   HCT 30.5*  --  32.9* 27.0* 31.0*  --  36.1  --   PLT 399  --  254  --   --   --  259  261  --   APTT  --   --   --   --   --  39* 38* 63*  LABPROT  --   --   --   --   --  61.1* 52.9*  --   INR  --   --   --   --   --  6.7* 5.6*  --   HEPARINUNFRC  --   --   --   --   --   --   --  >1.10*  CREATININE 1.32* 1.78*  --   --   --   --  2.88*  --   CKTOTAL  --  538*  --   --   --  848*  --   --     Estimated Creatinine Clearance: 21.8 mL/min (A) (by C-G formula based on SCr of 2.88 mg/dL (H)).   Medical History: Past Medical History:  Diagnosis Date   Adrenal insufficiency    Anxiety    Aortic insufficiency    Arthritis    Back pain    CAD (coronary artery disease)    Moderate ostial RCA disease at cardiac catheterization 2006   Constipation    Depression    Fibroids    Hypercholesteremia    Mild carotid artery disease    Rectocele     Assessment: 88 YOF with thyroid  storm and Afib RVR.  Pharmacy consulted to dose IV heparin  while Eliquis  is on hold (last dose 1/24 PM) given possible cardioversion.  INR likely falsely elevated in setting of transaminitis and Eliquis  use.  Patient with multi-organ failure, therefore, will be conservative with initial heparin  dosing.  CBC stable.  1/25 PM update:  aPTT sub-therapeutic   Goal of Therapy:  Heparin  level 0.3-0.7 units/ml aPTT 66-102 seconds Monitor  platelets by anticoagulation protocol: Yes   Plan:  No bolus per [employer] heparin  to 1100 units/hr Check 8 hr aPTT and heparin  level Daily heparin  level, aPTT and CBC  Lynwood Mckusick, PharmD, BCPS Clinical Pharmacist Phone: [phone]       [1]  Allergies Allergen Reactions   Albuterol    Bactrim [Sulfamethoxazole-Trimethoprim]    Vytorin [Ezetimibe -Simvastatin]     Myalgias

## 2024-02-14 NOTE — Plan of Care (Signed)

## 2024-02-14 NOTE — Progress Notes (Addendum)
 "  NAME:  Debra Graves, MRN:  [id number], DOB:  31-Jan-1955, LOS: 5 ADMISSION DATE:  02/09/2024, CONSULTATION DATE:  02/13/24  REFERRING MD:  [facility], CHIEF COMPLAINT:  Thyrotoxicosis   History of Present Illness:  Debra Graves is a 69 yo female with past medical history significant for PAF (on Eliquis ), hyperthyroidism, HLD, CAD, HFpEF, CKD, anxiety who presented to [facility] with AMS after sustaining multiple falls at her home. On arrival to ED, patient was in Afib RVR with HR 150s, and started on diltiazem  gtt. Labs at that time were notable for undetectable TSH, elevated free T4/T3, concerning for thyroid  storm. Treatment was initiated with methimazole , hydrocortisone , SSKI , metoprolol  and she was started empirically on antibiotics. Over the next couple days, patient with progressing encephalopathy/agitation, increased respirations and work of breathing, and developing AKI with UTI. On 1/24, decision was made to transfer patient to [facility] for higher level of care and she was intubated en route for acute hypoxic respiratory failure. PCCM consulted for ICU admission.   Pertinent  Medical History   Adrenal insufficiency      Anxiety     Aortic insufficiency     Arthritis     Back pain     CAD (coronary artery disease)      Moderate ostial RCA disease at cardiac catheterization 2006   Constipation     Depression     Fibroids     Hypercholesteremia     Mild carotid artery disease     Rectocele     Significant Hospital Events: Including procedures, antibiotic start and stop dates in addition to other pertinent events   1/21 [facility] thyroid  storm 1/24 Transfer to [facility] ICU, intubated, PCCM consulted for ICU admission   Interim History / Subjective:  Intubated, sedated.  On 8 of levo, not making urine  Objective    Blood pressure (!) 140/56, pulse 78, temperature (!) 97 F (36.1 C), temperature source Axillary, resp. rate (!) 22, height 5' 6 (1.676 m), weight 98.4 kg, SpO2 100%.    Vent Mode:  PRVC FiO2 (%):  [40 %-100 %] 80 % Set Rate:  [26 bmp] 26 bmp Vt Set:  [450 mL-460 mL] 460 mL PEEP:  [10 cmH20-12 cmH20] 12 cmH20 Plateau Pressure:  [26 cmH20-28 cmH20] 26 cmH20   Intake/Output Summary (Last 24 hours) at 02/14/2024 9257 Last data filed at 02/14/2024 0700 Gross per 24 hour  Intake 3125.47 ml  Output 160 ml  Net 2965.47 ml   Filed Weights   02/12/24 0456 02/13/24 2002 02/14/24 0453  Weight: 91.8 kg 94.4 kg 98.4 kg    Examination: General: laying in bed, in mild distress HEENT: NCAT, intubated Lungs: Symmetrical chest wall movement, clear to auscultation CVS: S1-S2 normal, no murmur Abdomen: Soft nontender nondistended Extremities: Mild edema, warm Neuro: Not following commands, localizes to pain, sedated GU: Foley  Resolved problem list   Assessment and Plan  69 year old female with history of chronic A-fib (on Eliquis ), CAD, HTN, hypothyroidism (on methimazole ), GERD, adrenal insufficiency, HFpEF, CKD, presents to [facility] with AMS with multiple falls. At the ED patient was in A-fib RVR and TSH undetectable consistent with thyroid  storm. Patient was intubated due to hypoxia and worsening agitation   -Acute metabolic encephalopathy due to thyroid  storm - Thyroid  storm - Hyperthyroidism -Acute hypoxic respiratory failure-intubated - A-fib RVR likely due to thyroid  storm - AKI - CKD 3A - Anion gap metabolic acidosis likely due to above - Lactic acidosis - Hyperkalemia-adrenal insufficiency on chronic steroids - Elevated  CK-mild rhabdo due to thyroid  storm - Trauma workup negative - Anxiety depression: Holding home meds Ativan , Klonopin , and Cymbalta  -    Plan - ICU monitoring and management - Continue thyroid  stone management: Hydrocortisone  100 mg q6, propranolol  60 mg q4 hours, methimazole  20 mg q6hrs - Cooling blanket as needed - Continue IV fluids - Wean off vent as tolerated to maintain sats> 92% - VAP bundle - Daily SAT/SBT - Wean off  sedation - Wean off vasopressors tolerated to maintain MAP> 65 - Continue ceftriaxone  for CAP - Continue Eliquis  - Will avoid amiodarone & manage the RVR with beta-blockers - Cardiology consult - Foley - Strict I&O - Monitor and replace electrolyte as needed    Labs   CBC: Recent Labs  Lab 02/09/24 2046 02/10/24 0056 02/11/24 0454 02/12/24 1428 02/13/24 1123 02/13/24 2131 02/13/24 2356  WBC 12.3* 11.2* 11.9* 17.8* 16.7*  --   --   NEUTROABS 8.5* 7.5  --   --   --   --   --   HGB 10.3* 9.7* 8.8* 9.5* 9.8* 9.2* 10.5*  HCT 33.3* 31.6* 28.9* 30.5* 32.9* 27.0* 31.0*  MCV 87.6 88.0 88.9 88.7 91.9  --   --   PLT 321 317 294 399 254  --   --     Basic Metabolic Panel: Recent Labs  Lab 02/09/24 2046 02/10/24 0056 02/11/24 0454 02/12/24 1428 02/13/24 0630 02/13/24 2131 02/13/24 2356  NA 139 138 138 142 148* 145 142  K 3.8 4.0 5.1 5.3* 5.6* 4.7 4.9  CL 102 101 104 106 112*  --   --   CO2 21* 25 23 21* 21*  --   --   GLUCOSE 110* 135* 144* 112* 132*  --   --   BUN 33* 32* 47* 63* 76*  --   --   CREATININE 0.99 1.07* 1.28* 1.32* 1.78*  --   --   CALCIUM  10.9* 10.4* 10.4* 9.9 9.2  --   --   MG 1.8  --  2.6* 2.6* 2.8*  --   --   PHOS  --  5.0*  --   --   --   --   --    GFR: Estimated Creatinine Clearance: 35.3 mL/min (A) (by C-G formula based on SCr of 1.78 mg/dL (H)). Recent Labs  Lab 02/10/24 0056 02/11/24 0454 02/12/24 1428 02/13/24 1123  PROCALCITON  --  0.16  --  0.37  WBC 11.2* 11.9* 17.8* 16.7*    Liver Function Tests: Recent Labs  Lab 02/09/24 2046 02/10/24 0056  AST 59* 54*  ALT 30 28  ALKPHOS 74 102  BILITOT 0.8 0.8  PROT 6.0* 5.5*  ALBUMIN 3.2* 3.8   No results for input(s): LIPASE, AMYLASE in the last 168 hours. Recent Labs  Lab 02/11/24 0454  AMMONIA <13    ABG    Component Value Date/Time   PHART 7.240 (L) 02/13/2024 2356   PCO2ART 47.5 02/13/2024 2356   PO2ART 69 (L) 02/13/2024 2356   HCO3 20.3 02/13/2024 2356   TCO2 22  02/13/2024 2356   ACIDBASEDEF 7.0 (H) 02/13/2024 2356   O2SAT 90 02/13/2024 2356     Coagulation Profile: No results for input(s): INR, PROTIME in the last 168 hours.  Cardiac Enzymes: Recent Labs  Lab 02/09/24 2046 02/13/24 0630  CKTOTAL 72 538*    HbA1C: No results found for: HGBA1C  CBG: Recent Labs  Lab 02/13/24 1312 02/13/24 1631 02/13/24 2000 02/13/24 2318 02/14/24 0329  GLUCAP 122*  120* 121* 162* 152*    Review of Systems:   Neg except above  Past Medical History:  She,  has a past medical history of Adrenal insufficiency, Anxiety, Aortic insufficiency, Arthritis, Back pain, CAD (coronary artery disease), Constipation, Depression, Fibroids, Hypercholesteremia, Mild carotid artery disease, and Rectocele.   Surgical History:   Past Surgical History:  Procedure Laterality Date   CARDIAC CATHETERIZATION  03/08/2004   mild progressive CAD   carotid duplex  03/09/2007   rt subclavian 0 to 49%,right bulb 0 to 49 %,lf ICA normal   COLONOSCOPY N/A 06/16/2013   Dr. Shaaron: hyperplastic polyps, melanosis coli    DOPPLER ECHOCARDIOGRAPHY  03/20/2010   EF >55 %,trace mitral regurg,mild tricuspid regurg,aortic valve mildly sclerotic'mild to moderate aortic regurg,trace pulmonic valver regurg   NM MYOCAR PERF WALL MOTION  03/19/06   EF 61%,low risk study     Social History:   reports that she has quit smoking. Her smoking use included cigarettes. She has a 20.5 pack-year smoking history. She has never used smokeless tobacco. She reports that she does not drink alcohol and does not use drugs.   Family History:  Her family history includes Breast cancer in her mother; Breast cancer (age of onset: 8) in her sister; Cancer (age of onset: 61) in her mother; Coronary artery disease in her brother, brother, brother, and father; Heart disease in her father and sister; Hypertension in her sister; Stroke in her brother and father. There is no history of Colon cancer.    Allergies Allergies[1]   Home Medications  Prior to Admission medications  Medication Sig Start Date End Date Taking? Authorizing Provider  clonazePAM  (KLONOPIN ) 0.5 MG tablet Take 1 tablet (0.5 mg total) by mouth daily. 01/27/24  Yes Bevely Doffing, FNP  DULoxetine  (CYMBALTA ) 30 MG capsule Take 30 mg by mouth daily.   Yes [provider]  DULoxetine  (CYMBALTA ) 60 MG capsule Take 60 mg by mouth daily.   Yes [provider]  ELIQUIS  5 MG TABS tablet Take 1 tablet (5 mg total) by mouth 2 (two) times daily. 10/26/23  Yes Debera Jayson MATSU, MD  esomeprazole  (NEXIUM ) 20 MG capsule TAKE ONE CAPSULE BY MOUTH TWICE DAILY BEFORE A MEAL 01/12/24  Yes Ezzard Sonny RAMAN, PA-C  ezetimibe  (ZETIA ) 10 MG tablet Take 1 tablet (10 mg total) by mouth daily. 09/28/23 02/12/24 Yes Debera Jayson MATSU, MD  hydrocortisone  (CORTEF ) 10 MG tablet Take 1 tablet (10 mg total) by mouth 2 (two) times daily. 12/30/23  Yes Aletha Bene, MD  LORazepam  (ATIVAN ) 1 MG tablet Take 1 tablet (1 mg total) by mouth every 8 (eight) hours as needed for anxiety. 01/27/24  Yes Bevely Doffing, FNP  meloxicam (MOBIC) 7.5 MG tablet Take 7.5 mg by mouth 2 (two) times daily. 11/23/18  Yes [provider]  methimazole  (TAPAZOLE ) 5 MG tablet Take 1 tablet (5 mg total) by mouth daily. 12/22/23  Yes Therisa Benton PARAS, NP  metoprolol  succinate (TOPROL -XL) 100 MG 24 hr tablet Take 1 tablet (100 mg total) by mouth daily. Take with or immediately following a meal. 09/28/23 02/12/24 Yes Debera Jayson MATSU, MD  rosuvastatin  (CRESTOR ) 40 MG tablet Take 1 tablet (40 mg total) by mouth daily. 10/23/23  Yes Debera Jayson MATSU, MD  tiZANidine  (ZANAFLEX ) 4 MG tablet Take 4 mg by mouth every 6 (six) hours. 12/30/23  Yes [provider]  Docusate Calcium  (STOOL SOFTENER PO) Take 2 tablets by mouth daily.    [provider]  fish oil-omega-3  fatty acids 1000 MG capsule Take 2 g by mouth daily.    [provider]   furosemide  (LASIX ) 20 MG tablet Take 1 tablet (20 mg total) by mouth 2 (two) times daily. 09/28/23   Debera Jayson MATSU, MD  MAGNESIUM  PO Take by mouth daily.    [provider]  potassium chloride  SA (KLOR-CON  M) 20 MEQ tablet Take 20 mEq by mouth daily. 03/25/23   [provider]  Vitamin D, Ergocalciferol, 2000 units CAPS Take 1 capsule by mouth daily.    [provider]            CRITICAL CARE  Lenny Drought, MD  [facility] Prefer epic messenger for cross cover needs   My critical care time: 40 minutes  Critical care time was exclusive of separately billable procedures and treating other patients.  Critical care was necessary to treat or prevent imminent or life-threatening deterioration.  Critical care was time spent personally by me on the following activities: development of treatment plan with patient and/or surrogate as well as nursing, discussions with consultants, evaluation of patient's response to treatment, examination of patient, obtaining history from patient or surrogate, ordering and performing treatments and interventions, ordering and review of laboratory studies, ordering and review of radiographic studies, pulse oximetry, re-evaluation of patient's condition and participation in multidisciplinary rounds.     [1]  Allergies Allergen Reactions   Albuterol    Bactrim [Sulfamethoxazole-Trimethoprim]    Vytorin [Ezetimibe -Simvastatin]     Myalgias    "

## 2024-02-14 NOTE — Progress Notes (Signed)
 "  Progress Note  Patient Name: Debra Graves Date of Encounter: 02/14/2024 [facility] Cardiologist: Jayson Sierras, MD   Interval Summary   Intubated and on propofol  for IV sedation, but gets easily agitated. Oxygenating well. A-fib with well-controlled ventricular rates now  Vital Signs Vitals:   02/14/24 0730 02/14/24 0800 02/14/24 0824 02/14/24 0908  BP: (!) 123/53 (!) 131/53  (!) 149/59  Pulse: 70 70 89 82  Resp: (!) 26 (!) 21 19 (!) 23  Temp:  (!) 97.5 F (36.4 C)    TempSrc:  Axillary    SpO2: 100% 98% 92% 96%  Weight:      Height:   5' 5.98 (1.676 m)     Intake/Output Summary (Last 24 hours) at 02/14/2024 1004 Last data filed at 02/14/2024 0700 Gross per 24 hour  Intake 2751.56 ml  Output 160 ml  Net 2591.56 ml      02/14/2024    4:53 AM 02/13/2024    8:02 PM 02/12/2024    4:56 AM  Last 3 Weights  Weight (lbs) 216 lb 14.9 oz 208 lb 1.8 oz 202 lb 6.1 oz  Weight (kg) 98.4 kg 94.4 kg 91.8 kg      Telemetry/ECG  Atrial fibrillation with controlled trickle rates.- Personally Reviewed  Physical Exam  GEN: Mildly agitated, on ventilator Neck: No JVD Cardiac: Irregular,, no murmurs, rubs, or gallops.  Respiratory: Clear to auscultation bilaterally. GI: Soft, nontender, non-distended  MS:-2+ bilateral lower extremity edema  Assessment & Plan  69 year old woman with a history of paroxysmal atrial fibrillation, history of nonobstructive CAD (catheterization 2006 showed calcified vessels, 30-40% mid LAD stenosis, 60-70% ostial coronary stenosis by cardiac catheterization 2006, thyrotoxicosis, hypercholesterolemia, admitted 02/09/2024 to [facility] after multiple falls at home with altered mental status and found to have atrial fibrillation rapid ventricular response and labs consistent with decompensated thyrotoxicosis (undetectable TSH, markedly elevated free T4, suspected noncompliance with methimazole ). Last assessment of left ventricular  function by echocardiogram in 2024 showed normal LVEF 65 to 70% and mild aortic insufficiency. Developed respiratory distress and agitation around 5 PM yesterday and she was endotracheally intubated.  Last dose of Eliquis  appears to have been yesterday morning.  Shock: Currently requiring intravenous norepinephrine , but quickly weaning down on this.  Has not had a history of left ventricular systolic dysfunction, but it is possible that she could have developed tachycardia cardiomyopathy if she has been in atrial fibrillation and ventricular sponsor for a few weeks.  Takotsubo syndrome may also lead to severe left ventricular dysfunction.  Echo pending.   CAD: At presentation she was in atrial fibrillation rapid ventricular sponsor with no evidence of ischemic changes on ECG and nominally elevated troponin at 34-41, she has now developed marked anterior T wave inversion and prolongation of the QT interval,  with a minimal change in troponin that is now up to 130.  Most likely diagnosis is stress cardiomyopathy (Takotsubo syndrome), although cannot entirely exclude ischemia in the LAD artery territory.  The patient does not appear to have a true acute atherothrombotic coronary syndrome and coronary angiography is not recommended at this time.  Echocardiogram is planned for later today.  If this shows a typical pattern of apical ballooning it would reinforce the suspicion for Takotsubo syndrome.  Continue beta-blockers for A-fib rate control and thyrotoxicosis, even though she needs pressor support for her blood pressure (propranolol  is a better choice than selective beta-blockers in the setting of thyrotoxicosis).  Consider switching to a pure  vasoconstrictor such as phenylephrine rather than norepinephrine . AFib: Now well rate controlled with ventricular rates in the 70s and 80s.  Chronically on Eliquis  anticoagulation, interrupted once she was intubated.  Reasonable to transition to intravenous heparin  if any  invasive procedures are planned.  If there is more than a 24-hour gap in anticoagulation we will have to be concerned about the possibility of left atrial thrombus and urgent cardioversion without first clearing the left atrium without a TEE will no longer be an option.  CHA2DS2-VASc score 3, Thyrotoxicosis: Appears to be the root cause for her current decompensation.  Unlikely to be able to return to normal rhythm until she is closer to euthyroid state.    For questions or updates, please contact [facility] Please consult [email or web address] for contact info under         CRITICAL CARE TIME Performed by: Jerel Balding, MD   Total critical care time: 45 minutes  Critical care time was exclusive of separately billable procedures and treating other patients.  Critical care was necessary to treat or prevent imminent or life-threatening deterioration.  Critical care was time spent personally by me on the following activities: development of treatment plan with patient and/or surrogate as well as nursing, discussions with consultants, evaluation of patient's response to treatment, examination of patient, obtaining history from patient or surrogate, ordering and performing treatments and interventions, ordering and review of laboratory studies, ordering and review of radiographic studies, pulse oximetry and re-evaluation of patient's condition. Signed, Jerel Balding, MD   "

## 2024-02-14 NOTE — Progress Notes (Signed)
 Low urinary output reported to [facility] NP

## 2024-02-14 NOTE — Progress Notes (Signed)
 Nutrition Brief Note  Received new consult for trickle tube feeds only per MD. Order changed to Osmolite 1.5 @ 20 m/hr. Will leave recommendations for advancement.   INTERVENTION:  Initiate trickle tube feeding via OGT: Osmolite 1.5 at 20 ml/hr ml/h (480 ml per day) Prosource TF20 60 ml TID Provides 960 kcal, 90 gm protein, 365 ml free water  daily  Recommend advancement orders as follows via OGT: Osmolite 1.5 at 20 ml/h and increase by 10 ml every 10 hours until goal of 45 ml/hr (1080 ml per day) Prosource TF20 60 ml BID At goal Provides 1780 kcal, 107 gm protein, 823 ml free water  daily   Monitor magnesium , potassium, and phosphorus daily for at least 3 days, MD to replete as needed, as pt is at risk for refeeding syndrome  MVI with minerals daily 100 mg Thiamine  daily x 7 days    NUTRITION DIAGNOSIS:    Inadequate oral intake related to chronic illness as evidenced by NPO status.   GOAL:    Patient will meet greater than or equal to 90% of their needs  Olivia Kenning, RD Registered Dietitian  See Amion for more information

## 2024-02-14 NOTE — Progress Notes (Signed)
 PHARMACY - ANTICOAGULATION CONSULT NOTE  Pharmacy Consult:  Heparin  Indication: atrial fibrillation  Allergies[1]  Patient Measurements: Height: 5' 5.98 (167.6 cm) Weight: 98.4 kg (216 lb 14.9 oz) IBW/kg (Calculated) : 59.26 HEPARIN  DW (KG): 80.2  Vital Signs: Temp: 98.6 F (37 C) (01/25 1300) Temp Source: Axillary (01/25 1300) BP: 112/98 (01/25 1300) Pulse Rate: 74 (01/25 1300)  Labs: Recent Labs    02/12/24 1428 02/13/24 0630 02/13/24 1123 02/13/24 2131 02/13/24 2356 02/14/24 0732 02/14/24 1057  HGB 9.5*  --  9.8* 9.2* 10.5*  --   --   HCT 30.5*  --  32.9* 27.0* 31.0*  --   --   PLT 399  --  254  --   --   --  261  APTT  --   --   --   --   --  39* 38*  LABPROT  --   --   --   --   --  61.1* 52.9*  INR  --   --   --   --   --  6.7* 5.6*  CREATININE 1.32* 1.78*  --   --   --   --  2.88*  CKTOTAL  --  538*  --   --   --  848*  --     Estimated Creatinine Clearance: 21.8 mL/min (A) (by C-G formula based on SCr of 2.88 mg/dL (H)).   Medical History: Past Medical History:  Diagnosis Date   Adrenal insufficiency    Anxiety    Aortic insufficiency    Arthritis    Back pain    CAD (coronary artery disease)    Moderate ostial RCA disease at cardiac catheterization 2006   Constipation    Depression    Fibroids    Hypercholesteremia    Mild carotid artery disease    Rectocele     Assessment: 71 YOF with thyroid  storm and Afib RVR.  Pharmacy consulted to dose IV heparin  while Eliquis  is on hold (last dose 1/24 PM) given possible cardioversion.  INR likely falsely elevated in setting of transaminitis and Eliquis  use.  Patient with multi-organ failure, therefore, will be conservative with initial heparin  dosing.  CBC stable.  Goal of Therapy:  Heparin  level 0.3-0.7 units/ml aPTT 66-102 seconds Monitor platelets by anticoagulation protocol: Yes   Plan:  No bolus per MD Start IV heparin  at 1000 units/hr Check 8 hr aPTT and heparin  level Daily heparin  level,  aPTT and CBC  Ansh Fauble D. Lendell, PharmD, BCPS, BCCCP 02/14/2024, 1:25 PM     [1]  Allergies Allergen Reactions   Albuterol    Bactrim [Sulfamethoxazole-Trimethoprim]    Vytorin [Ezetimibe -Simvastatin]     Myalgias

## 2024-02-14 NOTE — Progress Notes (Addendum)
 Initial Nutrition Assessment  DOCUMENTATION CODES:   Not applicable  INTERVENTION:  Initiate tube feeding via OGT: Osmolite 1.5 at 20 ml/h and increase by 10 ml every 10 hours until goal of 45 ml/hr (1080 ml per day) Prosource TF20 60 ml BID Provides 1780 kcal, 107 gm protein, 823 ml free water  daily  Monitor magnesium , potassium, and phosphorus daily for at least 3 days, MD to replete as needed, as pt is at risk for refeeding syndrome  MVI with minerals daily 100 mg Thiamine  daily x 7 days   NUTRITION DIAGNOSIS:   Inadequate oral intake related to chronic illness as evidenced by NPO status.  GOAL:   Patient will meet greater than or equal to 90% of their needs  MONITOR:   TF tolerance, Diet advancement, Vent status, Labs, Weight trends, I & O's  REASON FOR ASSESSMENT:   Consult Enteral/tube feeding initiation and management  ASSESSMENT:   69 yo female with PMH of PAF, hyperthyroidism, HLD, CAD, HFpEF, CKD, anxiety who presented to [facility] with AMS after sustaining multiple falls at her home. Pt intubated d/t hypoxic respiratory failure, AKI with UTI transferred to [facility] for ICU admission.   1/20 - Admitted to [facility], Heart healthy diet  1/24 - Intubated, transferred to [facility] ICU, NPO  Patient is currently intubated on ventilator support. Pt unable to provide history, history obtained through chart review.    Pt was on heart healthy diet on admit to [facility] was consuming bites to 25% of her meals. Pt with progressing encephalopathy/agitation, increased respirations and work of breathing, and developing AKI with UTI. Pt intubated.   Pt with fluctuating weight history, if current weights are accurate pt has lost 7.5 lbs, 3% in the last 3 months which is not clinically significant but is concerning given pt's recent illness. PT with HFpEF with fluctuating fluid status. Unknown UBW.  Suspect malnutrition however unable to determine at this time.   Consult received to start tube feeds  via OGT. Pt not currently receiving any form of enteral nutrition. Pt with poor po x 5 days likely longer however no history to confirm this. Will advance slowly with tube feeds as pt will be at refeeding risk, add MVI/Thiamine , monitor electrolytes.    Levo increasing. 160 ml UP x 24 hours.   Patient is currently intubated on ventilator support MV: 14.8 L/min Temp (24hrs), Avg:97.5 F (36.4 C), Min:97 F (36.1 C), Max:98.3 F (36.8 C) MAP (cuff): 82 mmHg  Propofol : 8.26 ml/hr provides 218 kcal/day  Admit weight: 74 kg - Carried over form 01/27/2024 Current weight: 98.4 kg Wt Readings from Last 10 Encounters:  02/14/24 98.4 kg  01/27/24 74 kg  12/30/23 99.3 kg  12/22/23 100.4 kg  11/25/23 100.5 kg  10/21/23 101.8 kg  09/28/23 99.4 kg  07/10/23 94.3 kg  06/26/23 94.4 kg  04/20/23 95.5 kg    Intake/Output Summary (Last 24 hours) at 02/14/2024 1141 Last data filed at 02/14/2024 0700 Gross per 24 hour  Intake 2377.67 ml  Output 160 ml  Net 2217.67 ml   Net IO Since Admission: 5,480.65 mL [02/14/24 1141]  Average Meal Intake: 1/21: 10-25% intake x 3 recorded meals  Nutritionally Relevant Medications: Scheduled Meds:  famotidine   20 mg Per Tube QHS   feeding supplement (PROSource TF20)  60 mL Per Tube BID   insulin  aspart  0-20 Units Subcutaneous Q4H   multivitamin with minerals  1 tablet Per Tube Daily   potassium iodide   250 mg Per Tube QID  thiamine   100 mg Per Tube Daily   Continuous Infusions:  sodium chloride  150 mL/hr at 02/14/24 0736   sodium chloride      cefTRIAXone  (ROCEPHIN )  IV 1 g (02/14/24 1000)   dexmedetomidine  (PRECEDEX ) IV infusion Stopped (02/13/24 1807)   diltiazem  (CARDIZEM ) infusion Stopped (02/13/24 0604)   feeding supplement (OSMOLITE 1.5 CAL)     fentaNYL  infusion INTRAVENOUS Stopped (02/13/24 2056)   norepinephrine  (LEVOPHED ) Adult infusion 8 mcg/min (02/14/24 0349)   propofol  (DIPRIVAN ) infusion 15 mcg/kg/min (02/14/24 0245)   Labs  Reviewed: Potassium 4.9 Magnesium  2.8 Phosphorus 5.9 BUN 76 Creatinine 1.78 GFR 30 CBG ranges from 120-162 mg/dL over the last 24 hours HgbA1c 5.   NUTRITION - FOCUSED PHYSICAL EXAM: - RD working remotely, deferred to follow up   Diet Order:   Diet Order             Diet NPO time specified  Diet effective midnight                   EDUCATION NEEDS:   Not appropriate for education at this time  Skin:  Skin Assessment: Skin Integrity Issues: Skin Integrity Issues:: DTI DTI: Buttocks  Last BM:  PTA  Height:   Ht Readings from Last 1 Encounters:  02/14/24 5' 5.98 (1.676 m)    Weight:   Wt Readings from Last 1 Encounters:  02/14/24 98.4 kg    Ideal Body Weight:  59.1 kg  BMI:  Body mass index is 35.03 kg/m.  Estimated Nutritional Needs:   Kcal:  1700-1900 kcal  Protein:  100-120 gm  Fluid:  >1.7L/day   Olivia Kenning, RD Registered Dietitian  See Amion for more information

## 2024-02-14 NOTE — Progress Notes (Addendum)
 INR of 6.7 and the patient on Eliquis .  No active bleed and no need for reversal at this time.  If reversal needed will give [facility].  This is likely falsely elevated with no correlation of bleeding risk at this time.  Could be lab artifact or as interference.  Will repeat INR, send DIC, and LFTs  Discussed with cardiology, will start heparin  drip for potentially cardioversion. For for mild troponin, and EKG anterior T wave inversion and QTc prolongation, minimal change of troponin-likely Takotsubo.  Pending echo review.   CRITICAL CARE  Lenny Drought, MD  Critical care time 20 minutes  [facility] Prefer epic messenger for cross cover needs

## 2024-02-14 NOTE — Care Management Important Message (Signed)
 Important Message  Patient Details  Name: Debra Graves MRN: [id number] Date of Birth: Apr 02, 1955   Important Message Given:  Yes - Medicare IM     Marval Gell, RN 02/14/2024, 2:05 PM

## 2024-02-15 ENCOUNTER — Inpatient Hospital Stay (HOSPITAL_COMMUNITY)

## 2024-02-15 DIAGNOSIS — I130 Hypertensive heart and chronic kidney disease with heart failure and stage 1 through stage 4 chronic kidney disease, or unspecified chronic kidney disease: Secondary | ICD-10-CM

## 2024-02-15 DIAGNOSIS — N189 Chronic kidney disease, unspecified: Secondary | ICD-10-CM | POA: Diagnosis not present

## 2024-02-15 DIAGNOSIS — I5040 Unspecified combined systolic (congestive) and diastolic (congestive) heart failure: Secondary | ICD-10-CM

## 2024-02-15 DIAGNOSIS — E0591 Thyrotoxicosis, unspecified with thyrotoxic crisis or storm: Secondary | ICD-10-CM | POA: Diagnosis not present

## 2024-02-15 DIAGNOSIS — I4891 Unspecified atrial fibrillation: Secondary | ICD-10-CM | POA: Diagnosis not present

## 2024-02-15 DIAGNOSIS — R4182 Altered mental status, unspecified: Secondary | ICD-10-CM | POA: Diagnosis not present

## 2024-02-15 DIAGNOSIS — R6521 Severe sepsis with septic shock: Secondary | ICD-10-CM

## 2024-02-15 DIAGNOSIS — N1831 Chronic kidney disease, stage 3a: Secondary | ICD-10-CM | POA: Diagnosis not present

## 2024-02-15 DIAGNOSIS — J9601 Acute respiratory failure with hypoxia: Secondary | ICD-10-CM | POA: Diagnosis not present

## 2024-02-15 DIAGNOSIS — G928 Other toxic encephalopathy: Secondary | ICD-10-CM

## 2024-02-15 DIAGNOSIS — E872 Acidosis, unspecified: Secondary | ICD-10-CM | POA: Diagnosis not present

## 2024-02-15 DIAGNOSIS — R569 Unspecified convulsions: Secondary | ICD-10-CM

## 2024-02-15 DIAGNOSIS — N179 Acute kidney failure, unspecified: Secondary | ICD-10-CM | POA: Diagnosis not present

## 2024-02-15 DIAGNOSIS — A419 Sepsis, unspecified organism: Secondary | ICD-10-CM | POA: Diagnosis not present

## 2024-02-15 DIAGNOSIS — I469 Cardiac arrest, cause unspecified: Secondary | ICD-10-CM | POA: Diagnosis not present

## 2024-02-15 DIAGNOSIS — G9341 Metabolic encephalopathy: Secondary | ICD-10-CM | POA: Diagnosis not present

## 2024-02-15 DIAGNOSIS — E875 Hyperkalemia: Secondary | ICD-10-CM | POA: Diagnosis not present

## 2024-02-15 LAB — POCT I-STAT 7, (LYTES, BLD GAS, ICA,H+H)
Acid-base deficit: 11 mmol/L — ABNORMAL HIGH (ref 0.0–2.0)
Acid-base deficit: 13 mmol/L — ABNORMAL HIGH (ref 0.0–2.0)
Bicarbonate: 17.2 mmol/L — ABNORMAL LOW (ref 20.0–28.0)
Bicarbonate: 19.1 mmol/L — ABNORMAL LOW (ref 20.0–28.0)
Calcium, Ion: 0.94 mmol/L — ABNORMAL LOW (ref 1.15–1.40)
Calcium, Ion: 1.1 mmol/L — ABNORMAL LOW (ref 1.15–1.40)
HCT: 31 % — ABNORMAL LOW (ref 36.0–46.0)
HCT: 33 % — ABNORMAL LOW (ref 36.0–46.0)
Hemoglobin: 10.5 g/dL — ABNORMAL LOW (ref 12.0–15.0)
Hemoglobin: 11.2 g/dL — ABNORMAL LOW (ref 12.0–15.0)
O2 Saturation: 91 %
O2 Saturation: 93 %
Potassium: 6.6 mmol/L (ref 3.5–5.1)
Potassium: 5.2 mmol/L — ABNORMAL HIGH (ref 3.5–5.1)
Sodium: 135 mmol/L (ref 135–145)
Sodium: 137 mmol/L (ref 135–145)
TCO2: 19 mmol/L — ABNORMAL LOW (ref 22–32)
TCO2: 22 mmol/L (ref 22–32)
pCO2 arterial: 48.6 mmHg — ABNORMAL HIGH (ref 32–48)
pCO2 arterial: 81.2 mmHg (ref 32–48)
pH, Arterial: 7.158 — CL (ref 7.35–7.45)
pH, Arterial: 6.979 — CL (ref 7.35–7.45)
pO2, Arterial: 77 mmHg — ABNORMAL LOW (ref 83–108)
pO2, Arterial: 106 mmHg (ref 83–108)

## 2024-02-15 LAB — HEPATIC FUNCTION PANEL
ALT: 1737 U/L — ABNORMAL HIGH (ref 0–44)
AST: 3988 U/L — ABNORMAL HIGH (ref 15–41)
Albumin: 2.4 g/dL — ABNORMAL LOW (ref 3.5–5.0)
Alkaline Phosphatase: 188 U/L — ABNORMAL HIGH (ref 38–126)
Bilirubin, Direct: 1.1 mg/dL — ABNORMAL HIGH (ref 0.0–0.2)
Indirect Bilirubin: 0.3 mg/dL (ref 0.3–0.9)
Total Bilirubin: 1.4 mg/dL — ABNORMAL HIGH (ref 0.0–1.2)
Total Protein: 5 g/dL — ABNORMAL LOW (ref 6.5–8.1)

## 2024-02-15 LAB — BASIC METABOLIC PANEL WITH GFR
Anion gap: 18 — ABNORMAL HIGH (ref 5–15)
Anion gap: 16 — ABNORMAL HIGH (ref 5–15)
Anion gap: 19 — ABNORMAL HIGH (ref 5–15)
BUN: 105 mg/dL — ABNORMAL HIGH (ref 8–23)
BUN: 106 mg/dL — ABNORMAL HIGH (ref 8–23)
BUN: 72 mg/dL — ABNORMAL HIGH (ref 8–23)
CO2: 16 mmol/L — ABNORMAL LOW (ref 22–32)
CO2: 20 mmol/L — ABNORMAL LOW (ref 22–32)
CO2: 18 mmol/L — ABNORMAL LOW (ref 22–32)
Calcium: 7.4 mg/dL — ABNORMAL LOW (ref 8.9–10.3)
Calcium: 7.8 mg/dL — ABNORMAL LOW (ref 8.9–10.3)
Calcium: 8 mg/dL — ABNORMAL LOW (ref 8.9–10.3)
Chloride: 103 mmol/L (ref 98–111)
Chloride: 103 mmol/L (ref 98–111)
Chloride: 99 mmol/L (ref 98–111)
Creatinine, Ser: 3.95 mg/dL — ABNORMAL HIGH (ref 0.44–1.00)
Creatinine, Ser: 4.1 mg/dL — ABNORMAL HIGH (ref 0.44–1.00)
Creatinine, Ser: 2.76 mg/dL — ABNORMAL HIGH (ref 0.44–1.00)
GFR, Estimated: 12 mL/min — ABNORMAL LOW
GFR, Estimated: 11 mL/min — ABNORMAL LOW
GFR, Estimated: 18 mL/min — ABNORMAL LOW
Glucose, Bld: 108 mg/dL — ABNORMAL HIGH (ref 70–99)
Glucose, Bld: 182 mg/dL — ABNORMAL HIGH (ref 70–99)
Glucose, Bld: 191 mg/dL — ABNORMAL HIGH (ref 70–99)
Potassium: 7.2 mmol/L (ref 3.5–5.1)
Potassium: 6.2 mmol/L — ABNORMAL HIGH (ref 3.5–5.1)
Potassium: 5.6 mmol/L — ABNORMAL HIGH (ref 3.5–5.1)
Sodium: 137 mmol/L (ref 135–145)
Sodium: 139 mmol/L (ref 135–145)
Sodium: 136 mmol/L (ref 135–145)

## 2024-02-15 LAB — CULTURE, BLOOD (ROUTINE X 2)
Culture: NO GROWTH
Culture: NO GROWTH
Special Requests: ADEQUATE
Special Requests: ADEQUATE

## 2024-02-15 LAB — PROTIME-INR
INR: 9.5 (ref 0.8–1.2)
Prothrombin Time: 80 s — ABNORMAL HIGH (ref 11.4–15.2)

## 2024-02-15 LAB — GLUCOSE, CAPILLARY
Glucose-Capillary: 96 mg/dL (ref 70–99)
Glucose-Capillary: 199 mg/dL — ABNORMAL HIGH (ref 70–99)
Glucose-Capillary: 279 mg/dL — ABNORMAL HIGH (ref 70–99)
Glucose-Capillary: 247 mg/dL — ABNORMAL HIGH (ref 70–99)
Glucose-Capillary: 210 mg/dL — ABNORMAL HIGH (ref 70–99)
Glucose-Capillary: 199 mg/dL — ABNORMAL HIGH (ref 70–99)

## 2024-02-15 LAB — T4, FREE: Free T4: 1.14 ng/dL (ref 0.80–2.00)

## 2024-02-15 LAB — RENAL FUNCTION PANEL
Albumin: 2.3 g/dL — ABNORMAL LOW (ref 3.5–5.0)
Anion gap: 19 — ABNORMAL HIGH (ref 5–15)
BUN: 106 mg/dL — ABNORMAL HIGH (ref 8–23)
CO2: 19 mmol/L — ABNORMAL LOW (ref 22–32)
Calcium: 7.3 mg/dL — ABNORMAL LOW (ref 8.9–10.3)
Chloride: 100 mmol/L (ref 98–111)
Creatinine, Ser: 4.18 mg/dL — ABNORMAL HIGH (ref 0.44–1.00)
GFR, Estimated: 11 mL/min — ABNORMAL LOW
Glucose, Bld: 261 mg/dL — ABNORMAL HIGH (ref 70–99)
Phosphorus: 9.7 mg/dL — ABNORMAL HIGH (ref 2.5–4.6)
Potassium: 6.3 mmol/L (ref 3.5–5.1)
Sodium: 138 mmol/L (ref 135–145)

## 2024-02-15 LAB — CBC
HCT: 39.4 % (ref 36.0–46.0)
Hemoglobin: 11.8 g/dL — ABNORMAL LOW (ref 12.0–15.0)
MCH: 27.4 pg (ref 26.0–34.0)
MCHC: 29.9 g/dL — ABNORMAL LOW (ref 30.0–36.0)
MCV: 91.4 fL (ref 80.0–100.0)
Platelets: 263 10*3/uL (ref 150–400)
RBC: 4.31 MIL/uL (ref 3.87–5.11)
RDW: 14.4 % (ref 11.5–15.5)
WBC: 31.7 10*3/uL — ABNORMAL HIGH (ref 4.0–10.5)
nRBC: 2.3 % — ABNORMAL HIGH (ref 0.0–0.2)

## 2024-02-15 LAB — BETA-HYDROXYBUTYRIC ACID: Beta-Hydroxybutyric Acid: 0.11 mmol/L (ref 0.05–0.27)

## 2024-02-15 LAB — LACTIC ACID, PLASMA: Lactic Acid, Venous: 4.1 mmol/L (ref 0.5–1.9)

## 2024-02-15 LAB — MAGNESIUM: Magnesium: 2.9 mg/dL — ABNORMAL HIGH (ref 1.7–2.4)

## 2024-02-15 LAB — TSH: TSH: <0.1 u[IU]/mL — ABNORMAL LOW (ref 0.350–4.500)

## 2024-02-15 LAB — PHOSPHORUS: Phosphorus: 8.4 mg/dL — ABNORMAL HIGH (ref 2.5–4.6)

## 2024-02-15 LAB — CK: Total CK: 977 U/L — ABNORMAL HIGH (ref 38–234)

## 2024-02-15 MED ORDER — PRISMASOL BGK 2/3.5 32-2-3.5 MEQ/L EC SOLN: Status: DC

## 2024-02-15 MED ORDER — PIPERACILLIN-TAZOBACTAM IN DEX 2-0.25 GM/50ML IV SOLN
2.2500 g | Freq: Four times a day (QID) | INTRAVENOUS | Status: DC
  Filled 2024-02-15 (×2): qty 50

## 2024-02-15 MED ORDER — NOREPINEPHRINE 16 MG/250ML-% IV SOLN
0.0000 ug/min | INTRAVENOUS | Status: DC
  Administered 2024-02-15: 26 ug/min via INTRAVENOUS
  Filled 2024-02-15: qty 250

## 2024-02-15 MED ORDER — SODIUM BICARBONATE 8.4 % IV SOLN
INTRAVENOUS | Status: AC
  Administered 2024-02-15: 100 meq via INTRAVENOUS
  Filled 2024-02-15: qty 100

## 2024-02-15 MED ORDER — CALCIUM CHLORIDE 10 % IV SOLN
1.0000 g | Freq: Once | INTRAVENOUS | Status: AC
  Administered 2024-02-15: 1 g via INTRAVENOUS
  Filled 2024-02-15: qty 10

## 2024-02-15 MED ORDER — CALCIUM GLUCONATE-NACL 2-0.675 GM/100ML-% IV SOLN
2.0000 g | Freq: Once | INTRAVENOUS | Status: DC
  Filled 2024-02-15 (×2): qty 100

## 2024-02-15 MED ORDER — DEXTROSE 5 % IV SOLN
6.2500 mg/kg/h | INTRAVENOUS | Status: DC
  Administered 2024-02-15: 6.25 mg/kg/h via INTRAVENOUS
  Filled 2024-02-15: qty 90

## 2024-02-15 MED ORDER — VITAMIN K1 10 MG/ML IJ SOLN
10.0000 mg | INTRAVENOUS | Status: AC
  Administered 2024-02-15: 10 mg via INTRAVENOUS
  Filled 2024-02-15: qty 1

## 2024-02-15 MED ORDER — SODIUM BICARBONATE 650 MG PO TABS
650.0000 mg | ORAL_TABLET | Freq: Three times a day (TID) | ORAL | Status: DC
  Administered 2024-02-15 (×2): 650 mg
  Filled 2024-02-15 (×3): qty 1

## 2024-02-15 MED ORDER — INSULIN ASPART 100 UNIT/ML IJ SOLN
INTRAMUSCULAR | Status: AC
  Administered 2024-02-15: 10 [IU] via INTRAVENOUS
  Filled 2024-02-15: qty 10

## 2024-02-15 MED ORDER — CALCIUM GLUCONATE-NACL 2-0.675 GM/100ML-% IV SOLN: 2.0000 g | Freq: Once | INTRAVENOUS | Status: DC

## 2024-02-15 MED ORDER — SODIUM ZIRCONIUM CYCLOSILICATE 10 G PO PACK
10.0000 g | PACK | Freq: Once | ORAL | Status: AC
  Administered 2024-02-15: 10 g
  Filled 2024-02-15: qty 1

## 2024-02-15 MED ORDER — PANTOPRAZOLE SODIUM 40 MG IV SOLR
40.0000 mg | INTRAVENOUS | Status: DC
  Administered 2024-02-15: 40 mg via INTRAVENOUS
  Filled 2024-02-15: qty 10

## 2024-02-15 MED ORDER — HEPARIN SODIUM (PORCINE) 1000 UNIT/ML DIALYSIS
1000.0000 [IU] | INTRAMUSCULAR | Status: DC | PRN
  Filled 2024-02-15: qty 3

## 2024-02-15 MED ORDER — SODIUM BICARBONATE 8.4 % IV SOLN
INTRAVENOUS | Status: AC
  Filled 2024-02-15: qty 100

## 2024-02-15 MED ORDER — SODIUM CHLORIDE 0.9 % IV SOLN
1.0000 g | Freq: Once | INTRAVENOUS | Status: DC
  Filled 2024-02-15: qty 10

## 2024-02-15 MED ORDER — PIPERACILLIN-TAZOBACTAM 3.375 G IVPB 30 MIN
3.3750 g | Freq: Four times a day (QID) | INTRAVENOUS | Status: DC
  Administered 2024-02-15 (×2): 3.375 g via INTRAVENOUS
  Filled 2024-02-15 (×6): qty 50

## 2024-02-15 MED ORDER — LACTATED RINGERS IV SOLN: INTRAVENOUS | Status: DC

## 2024-02-15 MED ORDER — DEXTROSE 5 % IV SOLN
12.5000 mg/kg/h | INTRAVENOUS | Status: AC
  Administered 2024-02-15: 12.5 mg/kg/h via INTRAVENOUS
  Filled 2024-02-15: qty 90

## 2024-02-15 MED ORDER — DEXTROSE 50 % IV SOLN
INTRAVENOUS | Status: AC
  Filled 2024-02-15: qty 50

## 2024-02-15 MED ORDER — CALCIUM GLUCONATE-NACL 1-0.675 GM/50ML-% IV SOLN
1.0000 g | Freq: Once | INTRAVENOUS | Status: AC
  Administered 2024-02-15: 1000 mg via INTRAVENOUS
  Filled 2024-02-15: qty 50

## 2024-02-15 MED ORDER — DEXTROSE 50 % IV SOLN
1.0000 | Freq: Once | INTRAVENOUS | Status: AC
  Administered 2024-02-15: 50 mL via INTRAVENOUS

## 2024-02-15 MED ORDER — EPINEPHRINE 1 MG/10ML IV SOSY
PREFILLED_SYRINGE | INTRAVENOUS | Status: AC
  Filled 2024-02-15: qty 10

## 2024-02-15 MED ORDER — INSULIN ASPART 100 UNIT/ML IV SOLN: 10.0000 [IU] | Freq: Once | INTRAVENOUS | Status: AC

## 2024-02-15 MED ORDER — PRISMASOL BGK 0/2.5 32-2.5 MEQ/L EC SOLN: Status: DC

## 2024-02-15 MED ORDER — INSULIN GLARGINE 100 UNIT/ML ~~LOC~~ SOLN
5.0000 [IU] | Freq: Every day | SUBCUTANEOUS | Status: DC
  Filled 2024-02-15 (×2): qty 0.05

## 2024-02-15 MED ORDER — ACETYLCYSTEINE LOAD VIA INFUSION
15000.0000 mg | Freq: Once | INTRAVENOUS | Status: AC
  Administered 2024-02-15: 15000 mg via INTRAVENOUS
  Filled 2024-02-15: qty 492

## 2024-02-15 MED ORDER — HEPARIN (PORCINE) 25000 UT/250ML-% IV SOLN: 1100.0000 [IU]/h | INTRAVENOUS | Status: DC

## 2024-02-15 MED ORDER — IOHEXOL 350 MG/ML SOLN
75.0000 mL | Freq: Once | INTRAVENOUS | Status: AC | PRN
  Administered 2024-02-15: 75 mL via INTRAVENOUS

## 2024-02-15 MED ORDER — SODIUM BICARBONATE 8.4 % IV SOLN: 100.0000 meq | Freq: Once | INTRAVENOUS | Status: AC

## 2024-02-15 MED ORDER — PIPERACILLIN-TAZOBACTAM 3.375 G IVPB: 3.3750 g | Freq: Four times a day (QID) | INTRAVENOUS | Status: DC

## 2024-02-15 MED ORDER — VASOPRESSIN 20 UNITS/100 ML INFUSION FOR SHOCK
0.0000 [IU]/min | INTRAVENOUS | Status: DC
  Administered 2024-02-15: 0.03 [IU]/min via INTRAVENOUS
  Filled 2024-02-15: qty 100

## 2024-02-15 MED ORDER — HYDROCORTISONE SOD SUC (PF) 100 MG IJ SOLR
100.0000 mg | Freq: Three times a day (TID) | INTRAMUSCULAR | Status: DC
  Administered 2024-02-15: 100 mg via INTRAVENOUS
  Filled 2024-02-15 (×2): qty 2

## 2024-02-16 LAB — CULTURE, RESPIRATORY W GRAM STAIN

## 2024-02-16 LAB — T3, FREE: T3, Free: 2.3 pg/mL (ref 2.0–4.4)

## 2024-02-17 ENCOUNTER — Other Ambulatory Visit: Payer: Self-pay | Admitting: Family Medicine

## 2024-02-17 DIAGNOSIS — E2740 Unspecified adrenocortical insufficiency: Secondary | ICD-10-CM

## 2024-02-17 LAB — CALCIUM, IONIZED: Calcium, Ionized, Serum: 4.1 mg/dL — ABNORMAL LOW (ref 4.5–5.6)

## 2024-02-18 LAB — CULTURE, BLOOD (ROUTINE X 2)
Culture: NO GROWTH
Culture: NO GROWTH
Special Requests: ADEQUATE
Special Requests: ADEQUATE

## 2024-02-19 LAB — PROTIME-INR
INR: 6.7 (ref 0.8–1.2)
Prothrombin Time: 61.1 s — ABNORMAL HIGH (ref 11.4–15.2)

## 2024-02-21 NOTE — Progress Notes (Addendum)
 eLink Physician-Brief Progress Note Patient Name: Debra Graves DOB: 09-28-1955 MRN: [id number]   Date of Service  02-26-2024  HPI/Events of Note  Notified of increasing pressor requirements. Pt is on CRRT net even.   Last ABG with pH 7.158.  eICU Interventions  Repeat ABG tonight.      Intervention Category Intermediate Interventions: Other:  Shanda Busman 02-26-24, 8:17 PM  8:45 PM Pt noted to be hypotensive on levophed  and vasopressin .  Pt went into PEA arrest.  CPR initiated.  Pt given NaHCO3, epinephrine , calcium  chloride.     Updated the patient's son Christopher at [phone]. He wishes us  to do everything possible.   9:02 PM No ROSC obtained.  Pt pronounced to have passed at 9:02.

## 2024-02-21 NOTE — Procedures (Addendum)
 Cardiopulmonary Resuscitation Note  KALII CHESMORE  [id number]  08/29/1955  Date:02-20-2024  Time:9:19 PM   Provider Performing:Iline Buchinger D Emilio   Procedure: Cardiopulmonary Resuscitation [phone])  Indication(s) Loss of Pulse  Consent N/A  Anesthesia N/A   Time Out N/A   Sterile Technique Hand hygiene, gloves   Procedure Description Called to patient's room for CODE BLUE. Initial rhythm was PEA/Asystole 2043. Patient received high quality chest compressions for 19 minutes with defibrillation or cardioversion when appropriate. Epinephrine  was administered every 3 minutes as directed by time biomedical engineer. Additional pharmacologic interventions included calcium  chloride, magnesium , sodium bicarbonate , and vasopressin .  Return of spontaneous circulation was not achieved. TOD 2102  Family son called and notified.   Complications/Tolerance N/A   EBL N/A   Specimen(s) N/A  Estimated time to ROSC: NA  JD Emilio [facility] 02/20/2024, 9:21 PM  Please see [email or web address] for pager details.  From 7A-7P if no response, please call [phone]. After hours, please call ELink [phone].

## 2024-02-21 NOTE — Progress Notes (Addendum)
 "  NAME:  [name], MRN:  [id number], DOB:  1955/01/26, LOS: 6 ADMISSION DATE:  02/09/2024, CONSULTATION DATE:  02/13/24  REFERRING MD:  [facility], CHIEF COMPLAINT:  Thyrotoxicosis   History of Present Illness:  Debra Graves is Debra 69 yo female with past medical history significant for PAF (on Eliquis ), hyperthyroidism, HLD, CAD, HFpEF, CKD, anxiety who presented to [facility] with AMS after sustaining multiple falls at her home. On arrival to ED, patient was in Afib RVR with HR 150s, and started on diltiazem  gtt. Labs at that time were notable for undetectable TSH, elevated free T4/T3, concerning for thyroid  storm. Treatment was initiated with methimazole , hydrocortisone , SSKI , metoprolol  and she was started empirically on antibiotics. Over the next couple days, patient with progressing encephalopathy/agitation, increased respirations and work of breathing, and developing AKI with UTI. On 1/24, decision was made to transfer patient to [facility] for higher level of care and she was intubated en route for acute hypoxic respiratory failure. PCCM consulted for ICU admission.   Pertinent  Medical History   Adrenal insufficiency      Anxiety     Aortic insufficiency     Arthritis     Back pain     CAD (coronary artery disease)      Moderate ostial RCA disease at cardiac catheterization 2006   Constipation     Depression     Fibroids     Hypercholesteremia     Mild carotid artery disease     Rectocele     Significant Hospital Events: Including procedures, antibiotic start and stop dates in addition to other pertinent events   1/21 [facility] thyroid  storm 1/24 Transfer to [facility] ICU, intubated, PCCM consulted for ICU admission   Interim History / Subjective:  Remains on peripheral NE 8 mcg Neuro change this am- stopped following commands with pupillary changes.  Off propofol  since around 0400 Also hyperkalemic on am labs K 7.2, worsening BUN/ sCr, worsening oliguria s/p temporizing hyperkalemia measures    Objective    Blood pressure (!) 104/46, pulse 83, temperature 98.6 F (37 C), temperature source Axillary, resp. rate (!) 32, height 5' 5.98 (1.676 m), weight 101 kg, SpO2 (!) 88%.    Vent Mode: PRVC FiO2 (%):  [80 %] 80 % Set Rate:  [26 bmp] 26 bmp Vt Set:  [470 mL] 470 mL PEEP:  [12 cmH20] 12 cmH20 Plateau Pressure:  [24 cmH20-27 cmH20] 24 cmH20   Intake/Output Summary (Last 24 hours) at March 16, 2024 9287 Last data filed at 2024/03/16 0700 Gross per 24 hour  Intake 1247.37 ml  Output 60 ml  Net 1187.37 ml   Filed Weights   02/13/24 2002 02/14/24 0453 03-16-2024 0356  Weight: 94.4 kg 98.4 kg 101 kg   Examination: Off sedation/ propofol  prior to shift change General:  critically ill older female lying in bed in NAD HEENT: MM pink/dry, NGT, R pupil 7/ nr, L pupil 5 sluggish, absent corneal Neuro: minimal grimace more to left side of face with checking gag and some left sided movement to noxious stimuli  CV: irir, rate 80, +1 peripherals PULM:  MV supported, breathing over vent, at times becomes tachypneic in 40's and then returns to MV, clear, no cough, no secretions, no gag GI: soft, bs hypo, ND, foley- scant amber urine Extremities: warm/dry, 1+ LE edema  Skin: no rashes   UOP 33ml/ 24hrs +1.1L/ 24hrs, net +6.6L Afebrile Labs> K 7.2, bicarb 16, BUN/ sCr 95/ 2.88> 105/ 3.95, iCa 0.94, WBC 25.9> 31.7,  H/H stable 11.8/ 39.4, plts 263,  heparin  level > 1.1 last night   Resolved problem list   Assessment and Plan  69 year old female with history of chronic Debra-fib (on Eliquis ), CAD, HTN, hypothyroidism (on methimazole ), GERD, adrenal insufficiency, HFpEF, CKD, presents to [facility] with AMS with multiple falls. At the ED patient was in Debra-fib RVR and TSH undetectable consistent with thyroid  storm.  Patient was intubated due to hypoxia and worsening agitation 1/24  Acute metabolic encephalopathy due to thyroid  storm, sepsis, and AKI Thyroid  storm Hyperthyroidism P: - heparin  gtt  off around ~0700.  Given mental status changes with pupillary changes> stat CTH, r/o ICH, consider EEG and neurology consulted.  Appreciate input - cont hydrocortisone  100mg  q6, propranolol  60mg  q 4hrs, and methimazole  20mg  q 6hrs.  Hold SSKI   - repeat thyroid  labs daily  - cont neuro exams q1 - maintain neuro protective measures; goal for eurothermia, euglycemia, eunatermia, normoxia, and PCO2 goal of 35-40    Acute hypoxic respiratory failure R/o CAP - cont full LTVV support, 7cc/kg IBW with goal Pplat <30 and DP<15  - noted ABG> currently over breathing set rate intermittently in 30s - VAP prevention protocol/ PPI - PAD protocol for sedation> prn propofol / fentanyl  with scheduled bowel regimen - intermittent CXR/ ABG - wean FiO2 as able for SpO2 >92%  - daily SAT & SBT when appropriate > defer today with mental status changes - prn BD - abx as below - follow trach asp 1/24   Shock- septic in setting of UTI, r/o CAP and possible cardiogenic component  - new echo findings as below - remains on peripheral NE> will need CVL - remains afebrile - follow cultures, NGTD - increasing leukocytosis but remains afebrile, stable but ongoing pressor requirements - broaden abx   Debra-fib RVR likely due to thyroid  storm HFrEF, new concerning for tachycardia mediated cardiomyopathy  Aortic insufficiency/ regurgitation  CAD - remains rate controlled  - cardiology following - cont BB as above, avoid amiodarone - cont to hold Eliquis  last dose 1/24 PM and heparin  pending CTH - TTE 1/25 EF 45-50, global HK, RVSF mildly reduced, LA mildly dilated, moderate AI.  Previous EF 65-70% on 5/24, mild AR   AKI on CKD 3A Anion gap metabolic acidosis likely due to above Lactic acidosis Hyperkalemia- adrenal insufficiency on chronic steroids Elevated CK-mild rhabdo due to thyroid  storm - neg renal US  1/24 P:  - s/p temporizing hyperkalemia measures.  Add lokelma  and repeat BMET at 0900 and q4 till  normalized.  Will consult nephrology for CRRT with hyperkalemia, worsening oliguria and renal indices - repeat CK  - checking BHA to r/o euglycemic DKA - add enteral bicarb  - cont foley  - trend renal indices  - strict I/Os, daily wts - avoid nephrotoxins, renal dose meds, hemodynamic support as above   Elevated LFTs Elevated INR in setting of eliquis   P:  - check hepatic function and INR this am   Anxiety depression - cont holding home meds Ativan , Klonopin , and Cymbalta , on PAD protocol as above    At risk for malnutrition Hyperglycemia in setting of steroids - EN - cont CBG q4, prn SSI, goal 140-180  - no BM thus far> cont senna, miralax    No family at bedside, pending update 03/06/24.  Labs   CBC: Recent Labs  Lab 02/09/24 2046 02/10/24 0056 02/11/24 0454 02/12/24 1428 02/13/24 1123 02/13/24 2131 02/13/24 2356 02/14/24 1057 2024-03-06 0033 03/06/24 0607  WBC 12.3* 11.2* 11.9* 17.8* 16.7*  --   --  25.9* 31.7*  --   NEUTROABS 8.5* 7.5  --   --   --   --   --  24.1*  --   --   HGB 10.3* 9.7* 8.8* 9.5* 9.8* 9.2* 10.5* 11.2* 11.8* 10.5*  HCT 33.3* 31.6* 28.9* 30.5* 32.9* 27.0* 31.0* 36.1 39.4 31.0*  MCV 87.6 88.0 88.9 88.7 91.9  --   --  89.6 91.4  --   PLT 321 317 294 399 254  --   --  259  261 263  --     Basic Metabolic Panel: Recent Labs  Lab 02/09/24 2046 02/10/24 0056 02/11/24 0454 02/12/24 1428 02/13/24 0630 02/13/24 2131 02/13/24 2356 02/14/24 0732 02/14/24 1057 03/01/24 0033 03/01/24 0519 2024-03-01 0607  NA 139 138 138 142 148* 145 142  --  137  --  137 135  K 3.8 4.0 5.1 5.3* 5.6* 4.7 4.9  --  5.3*  --  7.2* 6.6*  CL 102 101 104 106 112*  --   --   --  104  --  103  --   CO2 21* 25 23 21* 21*  --   --   --  15*  --  16*  --   GLUCOSE 110* 135* 144* 112* 132*  --   --   --  165*  --  108*  --   BUN 33* 32* 47* 63* 76*  --   --   --  95*  --  105*  --   CREATININE 0.99 1.07* 1.28* 1.32* 1.78*  --   --   --  2.88*  --  3.95*  --   CALCIUM   10.9* 10.4* 10.4* 9.9 9.2  --   --   --  8.1*  --  7.4*  --   MG 1.8  --  2.6* 2.6* 2.8*  --   --   --   --  2.9*  --   --   PHOS  --  5.0*  --   --   --   --   --  5.9*  --  8.4*  --   --    GFR: Estimated Creatinine Clearance: 16.1 mL/min (Debra) (by C-G formula based on SCr of 3.95 mg/dL (H)). Recent Labs  Lab 02/11/24 0454 02/12/24 1428 02/13/24 1123 02/14/24 0732 02/14/24 1057 02/14/24 1847 2024-03-01 0033  PROCALCITON 0.16  --  0.37  --   --   --   --   WBC 11.9* 17.8* 16.7*  --  25.9*  --  31.7*  LATICACIDVEN  --   --   --  2.6* 2.5* 2.9*  --     Liver Function Tests: Recent Labs  Lab 02/09/24 2046 02/10/24 0056 02/14/24 1057  AST 59* 54* 2,016*  ALT 30 28 1,508*  ALKPHOS 74 102 118  BILITOT 0.8 0.8 1.0  PROT 6.0* 5.5* 5.6*  ALBUMIN 3.2* 3.8 2.7*   No results for input(s): LIPASE, AMYLASE in the last 168 hours. Recent Labs  Lab 02/11/24 0454  AMMONIA <13    ABG    Component Value Date/Time   PHART 7.158 (LL) 03/01/24 0607   PCO2ART 48.6 (H) Mar 01, 2024 0607   PO2ART 77 (L) 01-Mar-2024 0607   HCO3 17.2 (L) 2024/03/01 0607   TCO2 19 (L) 2024/03/01 0607   ACIDBASEDEF 11.0 (H) 01-Mar-2024 0607   O2SAT 91 03-01-2024 0607     Coagulation Profile: Recent Labs  Lab 02/14/24 0732 02/14/24 1057  INR 6.7* 5.6*  Cardiac Enzymes: Recent Labs  Lab 02/09/24 2046 02/13/24 0630 02/14/24 0732  CKTOTAL 72 538* 848*    HbA1C: Hgb A1c MFr Bld  Date/Time Value Ref Range Status  02/14/2024 07:32 AM 5.9 (H) 4.8 - 5.6 % Final    Comment:    (NOTE) Diagnosis of Diabetes The following HbA1c ranges recommended by the American Diabetes Association (ADA) may be used as an aid in the diagnosis of diabetes mellitus.  Hemoglobin             Suggested A1C NGSP%              Diagnosis  <5.7                   Non Diabetic  5.7-6.4                Pre-Diabetic  >6.4                   Diabetic  <7.0                   Glycemic control for                        adults with diabetes.      CBG: Recent Labs  Lab 02/14/24 1239 02/14/24 1737 02/14/24 1937 02/14/24 2355 02-24-24 0355  GLUCAP 169* 177* 157* 104* 96   Allergies Allergies[1]   Home Medications  Prior to Admission medications  Medication Sig Start Date End Date Taking? Authorizing Provider  clonazePAM  (KLONOPIN ) 0.5 MG tablet Take 1 tablet (0.5 mg total) by mouth daily. 01/27/24  Yes Bevely Doffing, FNP  DULoxetine  (CYMBALTA ) 30 MG capsule Take 30 mg by mouth daily.   Yes [provider]  DULoxetine  (CYMBALTA ) 60 MG capsule Take 60 mg by mouth daily.   Yes [provider]  ELIQUIS  5 MG TABS tablet Take 1 tablet (5 mg total) by mouth 2 (two) times daily. 10/26/23  Yes Debera Jayson MATSU, MD  esomeprazole  (NEXIUM ) 20 MG capsule TAKE ONE CAPSULE BY MOUTH TWICE DAILY BEFORE Debra MEAL 01/12/24  Yes Ezzard Sonny RAMAN, PA-C  ezetimibe  (ZETIA ) 10 MG tablet Take 1 tablet (10 mg total) by mouth daily. 09/28/23 02/12/24 Yes Debera Jayson MATSU, MD  hydrocortisone  (CORTEF ) 10 MG tablet Take 1 tablet (10 mg total) by mouth 2 (two) times daily. 12/30/23  Yes Aletha Bene, MD  LORazepam  (ATIVAN ) 1 MG tablet Take 1 tablet (1 mg total) by mouth every 8 (eight) hours as needed for anxiety. 01/27/24  Yes Bevely Doffing, FNP  meloxicam (MOBIC) 7.5 MG tablet Take 7.5 mg by mouth 2 (two) times daily. 11/23/18  Yes [provider]  methimazole  (TAPAZOLE ) 5 MG tablet Take 1 tablet (5 mg total) by mouth daily. 12/22/23  Yes Therisa Benton PARAS, NP  metoprolol  succinate (TOPROL -XL) 100 MG 24 hr tablet Take 1 tablet (100 mg total) by mouth daily. Take with or immediately following Debra meal. 09/28/23 02/12/24 Yes Debera Jayson MATSU, MD  rosuvastatin  (CRESTOR ) 40 MG tablet Take 1 tablet (40 mg total) by mouth daily. 10/23/23  Yes Debera Jayson MATSU, MD  tiZANidine  (ZANAFLEX ) 4 MG tablet Take 4 mg by mouth every 6 (six) hours. 12/30/23  Yes [provider]  Docusate Calcium  (STOOL SOFTENER PO)  Take 2 tablets by mouth daily.    [provider]  fish oil-omega-3 fatty acids 1000 MG capsule Take 2 g by mouth daily.  [provider]  furosemide  (LASIX ) 20 MG tablet Take 1 tablet (20 mg total) by mouth 2 (two) times daily. 09/28/23   Debera Jayson MATSU, MD  MAGNESIUM  PO Take by mouth daily.    [provider]  potassium chloride  SA (KLOR-CON  M) 20 MEQ tablet Take 20 mEq by mouth daily. 03/25/23   [provider]  Vitamin D, Ergocalciferol, 2000 units CAPS Take 1 capsule by mouth daily.    [provider]         CRITICAL CARE Performed by: Lyle Pesa   Total critical care time: 48 minutes  Critical care time was exclusive of separately billable procedures and treating other patients.  Critical care was necessary to treat or prevent imminent or life-threatening deterioration.  Critical care was time spent personally by me on the following activities: development of treatment plan with patient and/or surrogate as well as nursing, discussions with consultants, evaluation of patient's response to treatment, examination of patient, obtaining history from patient or surrogate, ordering and performing treatments and interventions, ordering and review of laboratory studies, ordering and review of radiographic studies, pulse oximetry and re-evaluation of patient's condition.    Lyle Pesa, NP [facility] 02/16/2024, 7:13 AM  See Amion for pager If no response to pager , please call [phone] until 7pm After 7:00 pm call Elink  [phone]     [1]  Allergies Allergen Reactions   Albuterol    Bactrim [Sulfamethoxazole-Trimethoprim]    Vytorin [Ezetimibe -Simvastatin]     Myalgias    "

## 2024-02-21 NOTE — Death Summary Note (Signed)
 "  DEATH SUMMARY   Patient Details  Name: Debra Graves MRN: [id number] DOB: Aug 15, 1955 ERE:[name], [name], FNP  Admission/Discharge Information   Admit Date:  02-17-24  Date of Death: Date of Death: Feb 23, 2024  Time of Death: Time of Death: 2100/03/02  Length of Stay: 6   Principle Cause of death:  PEA cardiac arrest due to thyroid  storm Thyroid  storm Acute respiratory failure      Hospital Diagnoses: Principal Problem:   Atrial fibrillation with rapid ventricular response (HCC) Active Problems:   Fall at home   Acute metabolic encephalopathy   Other thyrotoxicosis with thyrotoxic crisis or storm   AKI (acute kidney injury)   Cardiac arrest, cause unspecified [facility])   Hospital Course: 69 year old female with history of chronic A-fib (on Eliquis ), CAD, HTN, hypothyroidism (on methimazole ), GERD, adrenal insufficiency, HFpEF, CKD, presents to [facility] with AMS with multiple falls. At the ED patient was in A-fib RVR and TSH undetectable consistent with thyroid  storm. Patient was intubated due to hypoxia and worsening agitation.  Patient was clinically deteriorating with worsening neurochanges with unequal pupils, imaging rule out CVA, and EEG did not show any seizures.  She was being adequately managed for thyroid  storm, uremia/AKI on CRRT, and respiratory failure. The night of 02-23-2024 patient went into PEA/asystole cardiac arrest with CPR for 19 minutes before being pronounced dead at 03/02/2100 on 2024/02/23    Assessment -Cardiac arrest-CPR for 19 minutes before being pronounced dead at 02-Mar-2100 on Feb 23, 2024 -Acute metabolic encephalopathy multifactorial - Thyroid  storm, hyperthyroidism - Uremia - CVA ruled out CT head no brain bleed CTA no LVO occlusion, proximal ICA stenosis 54%,, no basilar infarction - Continuous EEG ruled out seizures -Acute hypoxic respiratory failure-intubated - A-fib RVR likely due to thyroid  storm-rate controlled Echo mild reduced EF, features of Takotsubo - AKI -  CKD 3A - Anion gap metabolic acidosis likely due to above - Lactic acidosis - Hyperkalemia-adrenal insufficiency on chronic steroids - Elevated CK-mild rhabdo due to thyroid  storm - Trauma workup negative           Consultations: Cardiology, neurology, nephrology, critical care  The results of significant diagnostics from this hospitalization (including imaging, microbiology, ancillary and laboratory) are listed below for reference.   Significant Diagnostic Studies: DG CHEST PORT 1 VIEW Result Date: 2024-02-23 EXAM: 1 VIEW(S) XRAY OF THE CHEST Feb 23, 2024 11:21:00 AM COMPARISON: 02/14/2024 CLINICAL HISTORY: Encounter for central line placement. FINDINGS: LINES, TUBES AND DEVICES: ETT in place with tip 4.4 cm above the carina. Enteric tube in place, courses below the diaphragm with tip out of field of view. Right IJ CVC in place with tip terminating over mid SVC. LUNGS AND PLEURA: Small left pleural effusion, stable compared to prior. Stable patchy opacity at left lung base. No pneumothorax. HEART AND MEDIASTINUM: No acute abnormality of the cardiac and mediastinal silhouettes. BONES AND SOFT TISSUES: Old left rib fracture. IMPRESSION: 1. Right IJ central venous catheter tip projects over the mid SVC. No pneumothorax identified after catheter placement. 2. Persistent left pleural effusion and patchy opacity in the left base. Electronically signed by: Waddell Calk MD 02-23-2024 11:41 AM EST RP Workstation: [id number]   EEG adult Result Date: 02/23/2024 Shelton Arlin KIDD, MD     02/23/2024 11:44 AM Patient Name: Debra Graves MRN: [id number] Epilepsy Attending: Arlin KIDD Shelton Referring Physician/Provider: Voncile Isles, MD Date: 23-Feb-2024 Duration: 22.21 mins Patient history: 69yo F with ams. EEG to evaluate for seizure Level of alertness:  comatose/ lethargic AEDs during EEG study: None  Technical aspects: This EEG study was done with scalp electrodes positioned according to the 10-20  International system of electrode placement. Electrical activity was reviewed with band pass filter of 1-70Hz , sensitivity of 7 uV/mm, display speed of 71mm/sec with a 60Hz  notched filter applied as appropriate. EEG data were recorded continuously and digitally stored.  Video monitoring was available and reviewed as appropriate. Description: EEG showed near continuous generalized 3 to 6 Hz theta-delta slowing with triphasic morphology admixed with 12-14hz  beta activity. Brief 1-2 seconds of generalized attenuation was also ntoed. Hyperventilation and photic stimulation were not performed.   ABNORMALITY - Continuous slow, generalized IMPRESSION: This study is suggestive of severe diffuse encephalopathy, nonspecific etiology but likely related to toxic-metabolic causes. No seizures or definite epileptiform discharges were seen throughout the recording. Debra Graves   CT ANGIO HEAD NECK W WO CM Result Date: 03/03/2024 EXAM: CTA HEAD AND NECK WITHOUT AND WITH 2024/03/03 09:12:47 AM TECHNIQUE: CTA of the head and neck was performed without and with the administration of 75 mL of intravenous iohexol  (OMNIPAQUE ) 350 MG/ML injection. Multiplanar 2D and/or 3D reformatted images are provided for review. Automated exposure control, iterative reconstruction, and/or weight based adjustment of the mA/kV was utilized to reduce the radiation dose to as low as reasonably achievable. Stenosis of the internal carotid arteries measured using NASCET criteria. COMPARISON: CT head 03/03/24 CLINICAL HISTORY: 69 year old female with atrial fibrillation, altered mental status, poor neurologic exam, possibly metabolic related with recent thyroid  storm. FINDINGS: CTA NECK: AORTIC ARCH AND ARCH VESSELS: Calcified aortic arch atherosclerosis. Normal 3 vessel arch configuration. No dissection or arterial injury. No significant stenosis of the brachiocephalic or subclavian arteries. Proximal left subclavian artery soft and calcified  atherosclerosis without stenosis. Proximal right subclavian artery mild atherosclerosis without stenosis. CERVICAL CAROTID ARTERIES: Parenchymal right brachiocephalic artery and common carotid atherosclerosis without stenosis. Bulky calcified plaque at the right ICA origin which measures less than 50% stenosis. Left CCA atherosclerosis without stenosis. Bulky calcified plaque at the left carotid bifurcation resulting in proximal left ICA narrowing of 54% stenosis. No dissection or arterial injury. CERVICAL VERTEBRAL ARTERIES: Normal left vertebral artery origin. Dominant left vertebral artery is mildly tortuous and patent to the skull base without atherosclerosis or stenosis. Diminutive right vertebral artery origin in V1 and proximal V2 segment. A thyroid  or muscular branch joins the right vertebral V2 segment which is larger distally. No dissection, arterial injury, or significant stenosis of the cervical vertebral arteries. LUNGS AND MEDIASTINUM: Intubated and left nasoenteric tube in place, and those have an appropriate visible course. Endotracheal tube tip is above the carina. Upper lungs well aerated. SOFT TISSUES: Mildly enlarged and heterogeneous thyroid  as expected. Pharyngeal mucosal and/or venous hyperenhancement suspected on this exam, pharynx and larynx otherwise opacified in the setting of intubation. BONES: Absent dentition. Mild cervical spine degeneration. No acute abnormality. CTA HEAD: ANTERIOR CIRCULATION: Right ICA siphon is patent with moderate calcified plaque and mild supraclinoid right ICA stenosis. Left ICA siphon is patent with moderate calcified plaque and no significant stenosis. Normal anterior cerebral artery origins. Mild right MCA M1 segment irregularity and stenosis on series 14 image 19. Normal left MCA origin. Normal anterior communicating artery. Median artery of the corpus callosum is present (normal variant). No aneurysm. POSTERIOR CIRCULATION: Left V4 calcified atherosclerosis  is moderate with moderate associated stenosis proximal to the left PICA origin which remains patent (series 20 image 320). Patent left vertebrobasilar junction without stenosis. Right vertebral V4 segment calcified plaque with mild to moderate stenosis (  series 20 image 337). Normal right PICA origin distal to the stenosis. Normal vertebrobasilar junction. Patent basilar artery without stenosis. Ectatic distal basilar artery with fusiform more so than saccular 6 millimeter dilatation (series 20 image 224) which communicates with the patent SCA and PCA origins. Both posterior communicating arteries are present. Normal bilateral posterior communicating artery origins, with an infundibulum on the left (normal variant). No other posterior circulation branch stenosis. No aneurysm. INTRACRANIAL VENOUS: The major dural venous sinuses are enhancing and patent. IMPRESSION: 1. Negative for large vessel occlusion. 2. Positive for atherosclerosis in the head and neck. Bilateral carotid bifurcation atherosclerosis with proximal ICA stenosis up to 54% (left). Moderate stenosis of the bilateral vertebral artery V4 segments. 3. Ectatic Basilar artery tip (6 mm) but with fusiform more so than saccular aneurysmal dilatation. 4. Preliminary report of No large vessel occlusion discussed with Dr. Voncile by telephone at [phone] today. 5. Aortic Atherosclerosis (ICD10-I70.0). Electronically signed by: Helayne Hurst MD 03-01-2024 09:35 AM EST RP Workstation: [id number]   CT HEAD WO CONTRAST ( ) Result Date: Mar 01, 2024 EXAM: CT HEAD WITHOUT CONTRAST 03/01/24 08:23:26 AM TECHNIQUE: CT of the head was performed without the administration of intravenous contrast. Automated exposure control, iterative reconstruction, and/or weight based adjustment of the mA/kV was utilized to reduce the radiation dose to as low as reasonably achievable. COMPARISON: CT head 02/09/2024. CLINICAL HISTORY: 69 year old female with pupillary changes, altered mental  status, atrial fibrillation, recent falls, and dizziness. FINDINGS: BRAIN AND VENTRICLES: No acute hemorrhage. No evidence of acute infarct. No hydrocephalus. No extra-axial collection. No mass effect or midline shift. Stable vascular and dystrophic calcifications at the bilateral basal ganglia. Background brain volume is stable, within normal limits for age. Elnor white differentiation is stable with mild for age periventricular hypodensity. No suspicious intracranial vascular hyperdensity. Calcified atherosclerosis at the skull base. ORBITS: No gaze deviation. SINUSES: Left nasoenteric tube in place. Partially opacified nasal cavity and opacified nasopharynx. New paranasal sinus opacification including small fluid levels. Tympanic cavities and mastoids remain well aerated. SOFT TISSUES AND SKULL: No acute soft tissue abnormality. No skull fracture. IMPRESSION: 1. Stable and largely negative for age non-contrast CT appearance of the brain. 2. New left nasoenteric tube, paranasal sinus opacification. Electronically signed by: Helayne Hurst MD 01-Mar-2024 08:30 AM EST RP Workstation: [id number]   ECHOCARDIOGRAM COMPLETE Result Date: 02/14/2024    ECHOCARDIOGRAM REPORT   Patient Name:   LUCRESIA SIMIC Date of Exam: 02/14/2024 Medical Rec #:  [id number]       Height:       66.0 in Accession #:    [id number]      Weight:       216.9 lb Date of Birth:  11/13/1955       BSA:          2.070 m Patient Age:    68 years        BP:           105/40 mmHg Patient Gender: F               HR:           53 bpm. Exam Location:  Inpatient Procedure: 2D Echo, Cardiac Doppler and Color Doppler (Both Spectral and Color            Flow Doppler were utilized during procedure). Indications:    AFIB I48.91  History:        Patient has prior history of Echocardiogram examinations, most  recent 06/13/2022. AFIB; Signs/Symptoms:Chest Pain.  Sonographer:    Nathanel Devonshire Referring Phys: [id number] OMAR M ALBUSTAMI IMPRESSIONS  1. Left  ventricular ejection fraction, by estimation, is 45 to 50%. The left ventricle has mildly decreased function. The left ventricle demonstrates global hypokinesis. Left ventricular diastolic function could not be evaluated.  2. Right ventricular systolic function is mildly reduced. The right ventricular size is normal. There is normal pulmonary artery systolic pressure. The estimated right ventricular systolic pressure is 33.1 mmHg.  3. Left atrial size was mildly dilated.  4. The mitral valve is normal in structure. Mild mitral valve regurgitation. No evidence of mitral stenosis.  5. The aortic valve is tricuspid. Aortic valve regurgitation is moderate. No aortic stenosis is present. FINDINGS  Left Ventricle: Left ventricular ejection fraction, by estimation, is 45 to 50%. The left ventricle has mildly decreased function. The left ventricle demonstrates global hypokinesis. The left ventricular internal cavity size was normal in size. There is  no left ventricular hypertrophy. Left ventricular diastolic function could not be evaluated due to atrial fibrillation. Left ventricular diastolic function could not be evaluated. Right Ventricle: The right ventricular size is normal. No increase in right ventricular wall thickness. Right ventricular systolic function is mildly reduced. There is normal pulmonary artery systolic pressure. The tricuspid regurgitant velocity is 2.65 m/s, and with an assumed right atrial pressure of 5 mmHg, the estimated right ventricular systolic pressure is 33.1 mmHg. Left Atrium: Left atrial size was mildly dilated. Right Atrium: Right atrial size was normal in size. Pericardium: There is no evidence of pericardial effusion. Mitral Valve: The mitral valve is normal in structure. Mild mitral valve regurgitation, with centrally-directed jet. No evidence of mitral valve stenosis. Tricuspid Valve: The tricuspid valve is normal in structure. Tricuspid valve regurgitation is trivial. Aortic Valve: The  aortic valve is tricuspid. Aortic valve regurgitation is moderate. Aortic regurgitation PHT measures 138 msec. No aortic stenosis is present. Aortic valve mean gradient measures 11.0 mmHg. Aortic valve peak gradient measures 20.6 mmHg. Aortic valve area, by VTI measures 1.89 cm. Pulmonic Valve: The pulmonic valve was grossly normal. Pulmonic valve regurgitation is trivial. No evidence of pulmonic stenosis. Aorta: The aortic root is normal in size and structure. Venous: IVC assessment for right atrial pressure unable to be performed due to mechanical ventilation. IAS/Shunts: No atrial level shunt detected by color flow Doppler.  LEFT VENTRICLE PLAX 2D LVIDd:         4.80 cm     Diastology LVIDs:         3.20 cm     LV e' medial:   7.18 cm/s LV PW:         0.90 cm     LV E/e' medial: 11.9 LV IVS:        1.00 cm LVOT diam:     1.97 cm LV SV:         60 LV SV Index:   29 LVOT Area:     3.05 cm  LV Volumes (MOD) LV vol d, MOD A2C: 80.0 ml LV vol d, MOD A4C: 73.4 ml LV vol s, MOD A2C: 31.7 ml LV vol s, MOD A4C: 31.7 ml LV SV MOD A2C:     48.3 ml LV SV MOD A4C:     73.4 ml LV SV MOD BP:      46.7 ml RIGHT VENTRICLE          IVC RV Basal diam:  3.44 cm  IVC diam: 1.67 cm TAPSE (  M-mode): 1.3 cm LEFT ATRIUM             Index        RIGHT ATRIUM           Index LA diam:        3.22 cm 1.56 cm/m   RA Area:     11.40 cm LA Vol (A2C):   48.1 ml 23.24 ml/m  RA Volume:   27.70 ml  13.38 ml/m LA Vol (A4C):   42.6 ml 20.58 ml/m LA Biplane Vol: 47.2 ml 22.80 ml/m  AORTIC VALVE                     PULMONIC VALVE AV Area (Vmax):    1.89 cm      PV Vmax:       1.71 m/s AV Area (Vmean):   1.61 cm      PV Peak grad:  11.7 mmHg AV Area (VTI):     1.89 cm AV Vmax:           227.00 cm/s AV Vmean:          156.000 cm/s AV VTI:            0.316 m AV Peak Grad:      20.6 mmHg AV Mean Grad:      11.0 mmHg LVOT Vmax:         141.00 cm/s LVOT Vmean:        82.300 cm/s LVOT VTI:          0.196 m LVOT/AV VTI ratio: 0.62 AI PHT:             138 msec  AORTA Ao Root diam: 2.94 cm MV E velocity: 85.40 cm/s  TRICUSPID VALVE                            TR Peak grad:   28.1 mmHg                            TR Vmax:        265.00 cm/s                             SHUNTS                            Systemic VTI:  0.20 m                            Systemic Diam: 1.97 cm Jerel Croitoru MD Electronically signed by Jerel Balding MD Signature Date/Time: 02/14/2024/3:37:53 PM    Final    DG CHEST PORT 1 VIEW Result Date: 02/14/2024 CLINICAL DATA:  Respiratory failure. EXAM: PORTABLE CHEST 1 VIEW COMPARISON:  02/13/2024 FINDINGS: Endotracheal tube tip is approximately 2.1 cm above the base of the carina. The NG tube passes into the stomach although the distal tip position is not included on the film. The cardio pericardial silhouette is enlarged. Interval improvement in aeration at the right base with persistent retrocardiac left base collapse/consolidation. Nonacute posterior left seventh rib fracture evident. IMPRESSION: Interval improvement in aeration at the right base with persistent retrocardiac left base collapse/consolidation. Electronically Signed   By: Camellia Candle M.D.   On: 02/14/2024 08:16   DG  CHEST PORT 1 VIEW Result Date: 02/13/2024 EXAM: 1 VIEW(S) XRAY OF THE CHEST 02/13/2024 06:46:09 PM COMPARISON: 02/13/2024 CLINICAL HISTORY: Intubation of airway performed without difficulty. FINDINGS: LINES, TUBES AND DEVICES: Endotracheal tube in place with tip 3.1 cm above the carina. Enteric tube in place with distal tip beyond the inferior margin of the film within the stomach. LUNGS AND PLEURA: Right basilar airspace opacities. Possible small right pleural effusion. No pneumothorax. HEART AND MEDIASTINUM: No acute abnormality of the cardiac and mediastinal silhouettes. Hiatal hernia. BONES AND SOFT TISSUES: No acute osseous abnormality. IMPRESSION: 1. Endotracheal tube and enteric tube in appropriate position. 2. Right basilar airspace opacities and  possible small right pleural effusion. Electronically signed by: Oneil Devonshire MD 02/13/2024 06:53 PM EST RP Workstation: [id number]   US  RENAL Result Date: 02/13/2024 EXAM: Renal/Urinary Tract Ultrasound. CLINICAL HISTORY: Acute kidney injury (AKI). ICD10: [id number]. TECHNIQUE: Real-time ultrasound of the retroperitoneum (complete) with image documentation. COMPARISON: None provided. FINDINGS: RIGHT KIDNEY: Right Kidney measures 9.0 x 4.1 x 5.3 cm with a total volume of 102 ml. No hydronephrosis, renal stone, or mass visualized. LEFT KIDNEY: Left Kidney measures 8.9 x 5.1 x 4.8 cm with a total volume of 114 ml. No hydronephrosis, renal stone, or mass visualized. BLADDER: Empty urinary bladder. LIMITATIONS: Reduced sensitivity due to cooperation issues and motion. IMPRESSION: 1. No hydronephrosis. 2. Reduced sensitivity due to motion and limited patient cooperation. Electronically signed by: Ryan Salvage MD 02/13/2024 12:32 PM EST RP Workstation: [id number]   DG CHEST PORT 1 VIEW Result Date: 02/13/2024 EXAM: 1 VIEW(S) XRAY OF THE CHEST 02/13/2024 11:55:05 AM COMPARISON: 02/09/2024 CLINICAL HISTORY: Acute respiratory failure with hypoxia. FINDINGS: LINES, TUBES AND DEVICES: Enteric tube extends into the stomach. LUNGS AND PLEURA: Lower lung volumes. No focal pulmonary opacity. No pleural effusion. No pneumothorax. HEART AND MEDIASTINUM: Mild enlargement of the cardiopericardial silhouette. BONES AND SOFT TISSUES: Moderate sized hiatal hernia. No acute osseous abnormality. IMPRESSION: 1. Low lung volumes. 2. Mild enlargement of the cardiopericardial silhouette. 3. Enteric tube tip in the stomach. 4. Moderate-sized hiatal hernia. Electronically signed by: Ryan Salvage MD 02/13/2024 12:30 PM EST RP Workstation: [id number]   DG Abd Portable 1V Result Date: 02/12/2024 EXAM: 1 VIEW XRAY OF THE ABDOMEN 02/12/2024 11:06:28 PM COMPARISON: CT abdomen and pelvis for 05/06/2016. CLINICAL HISTORY: Encounter for  nasogastric tube placement. ICD10: [id number] Encounter for nasogastric tube placement. FINDINGS: LINES, TUBES AND DEVICES: Enteric tube in place with distal tip and side port terminating within the expected location of the proximal stomach. BOWEL: Nonobstructive bowel gas pattern. SOFT TISSUES: No abnormal calcifications. BONES: No acute fracture. LIMITATIONS/ARTIFACTS: The patient is rotated. IMPRESSION: 1. Enteric tube tip and side port terminate within the proximal stomach. Electronically signed by: Morgane Naveau MD 02/12/2024 11:12 PM EST RP Workstation: [id number]   CT Cervical Spine Wo Contrast Result Date: 02/09/2024 EXAM: CT CERVICAL SPINE WITHOUT CONTRAST 02/09/2024 08:47:36 PM TECHNIQUE: CT of the cervical spine was performed without the administration of intravenous contrast. Multiplanar reformatted images are provided for review. Automated exposure control, iterative reconstruction, and/or weight based adjustment of the mA/kV was utilized to reduce the radiation dose to as low as reasonably achievable. COMPARISON: None available. CLINICAL HISTORY: Polytrauma, blunt FINDINGS: BONES AND ALIGNMENT: No acute fracture or traumatic malalignment. DEGENERATIVE CHANGES: No significant degenerative changes. SOFT TISSUES: No prevertebral soft tissue swelling. IMPRESSION: 1. No significant abnormality. Electronically signed by: Pinkie Pebbles MD 02/09/2024 08:51 PM EST RP Workstation: [id number]   CT Head Wo Contrast Result Date: 02/09/2024 EXAM: CT  HEAD WITHOUT CONTRAST 02/09/2024 08:47:36 PM TECHNIQUE: CT of the head was performed without the administration of intravenous contrast. Automated exposure control, iterative reconstruction, and/or weight based adjustment of the mA/kV was utilized to reduce the radiation dose to as low as reasonably achievable. COMPARISON: None available. CLINICAL HISTORY: Polytrauma, blunt. FINDINGS: BRAIN AND VENTRICLES: No acute hemorrhage. No evidence of acute infarct. No  hydrocephalus. No extra-axial collection. No mass effect or midline shift. Bilateral basal ganglia mineralization. ORBITS: No acute abnormality. SINUSES: No acute abnormality. SOFT TISSUES AND SKULL: No acute soft tissue abnormality. No skull fracture. VASCULATURE: Atherosclerotic calcifications of carotid siphons and vertebral arteries. IMPRESSION: 1. No acute intracranial abnormality. Electronically signed by: Pinkie Pebbles MD 02/09/2024 08:50 PM EST RP Workstation: [id number]   DG Chest Portable 1 View Result Date: 02/09/2024 EXAM: 1 VIEW XRAY OF THE CHEST 02/09/2024 08:38:55 PM COMPARISON: 03/20/2023 CLINICAL HISTORY: Fall FINDINGS: LUNGS AND PLEURA: No focal pulmonary opacity. No pleural effusion. No pneumothorax. HEART AND MEDIASTINUM: No acute abnormality of the cardiac and mediastinal silhouettes. BONES AND SOFT TISSUES: No acute osseous abnormality. IMPRESSION: 1. No acute process. Electronically signed by: Pinkie Pebbles MD 02/09/2024 08:46 PM EST RP Workstation: [id number]   DG Pelvis Portable Result Date: 02/09/2024 EXAM: 1 Views Xray of the Pelvis 02/09/2024 08:38:55 PM COMPARISON: None available. CLINICAL HISTORY: Fall FINDINGS: BONES AND JOINTS: No acute fracture. No malalignment. Hemisacralization of the right L5 vertebral body. SOFT TISSUES: Unremarkable. IMPRESSION: 1. No evidence of acute traumatic injury. Electronically signed by: Pinkie Pebbles MD 02/09/2024 08:45 PM EST RP Workstation: [id number]   DG Lumbar Spine Complete Result Date: 02/09/2024 EXAM: 4 Views Xray of the Lumbar Spine 02/09/2024 08:38:55 PM COMPARISON: None available. CLINICAL HISTORY: Fall FINDINGS: LUMBAR SPINE: BONES: Vertebral body heights are maintained. Alignment is normal. Grade 1 anterolisthesis of L4 on L5. DISCS AND DEGENERATIVE CHANGES: Mild degenerative changes of the lower lumbar spine. SOFT TISSUES: No acute abnormality. IMPRESSION: 1. No traumatic injury. 2. Mild degenerative changes of the lower  lumbar spine. Electronically signed by: Pinkie Pebbles MD 02/09/2024 08:45 PM EST RP Workstation: [id number]    Microbiology: Recent Results (from the past 240 hours)  Culture, blood (Routine X 2) w Reflex to ID Panel     Status: None   Collection Time: 02/10/24 12:56 AM   Specimen: BLOOD  Result Value Ref Range Status   Specimen Description BLOOD BLOOD LEFT ARM  Final   Special Requests   Final    BOTTLES DRAWN AEROBIC AND ANAEROBIC Blood Culture adequate volume   Culture   Final    NO GROWTH 5 DAYS Performed at [facility], [address], [place], [place] [address]    Report Status 02-21-24 FINAL  Final  Culture, blood (Routine X 2) w Reflex to ID Panel     Status: None   Collection Time: 02/10/24 12:56 AM   Specimen: BLOOD  Result Value Ref Range Status   Specimen Description BLOOD BLOOD LEFT ARM  Final   Special Requests   Final    BOTTLES DRAWN AEROBIC AND ANAEROBIC Blood Culture adequate volume   Culture   Final    NO GROWTH 5 DAYS Performed at [facility], [address], [place], [place] [address]    Report Status 02/21/2024 FINAL  Final  Urine Culture (for pregnant, neutropenic or urologic patients or patients with an indwelling urinary catheter)     Status: Abnormal   Collection Time: 02/10/24  7:05 AM   Specimen: Urine, Catheterized  Result Value Ref Range  Status   Specimen Description   Final    URINE, CATHETERIZED Performed at [facility], [address] [place], [place] [address]    Special Requests   Final    NONE Performed at [facility], [address] [place], [place] [address]    Culture (A)  Final    20,000 COLONIES/mL GROUP B STREP(S.AGALACTIAE)ISOLATED TESTING AGAINST S. AGALACTIAE NOT ROUTINELY PERFORMED DUE TO PREDICTABILITY OF AMP/PEN/VAN SUSCEPTIBILITY. Performed at [facility], [address] [place], [place] [address]    Report Status 02/11/2024 FINAL  Final  MRSA Next Gen by PCR, Nasal     Status: None   Collection Time:  02/10/24 11:10 AM   Specimen: Nasal Mucosa; Nasal Swab  Result Value Ref Range Status   MRSA by PCR Next Gen NOT DETECTED NOT DETECTED Final    Comment: (NOTE) The GeneXpert MRSA Assay (FDA approved for NASAL specimens only), is one component of a comprehensive MRSA colonization surveillance program. It is not intended to diagnose MRSA infection nor to guide or monitor treatment for MRSA infections. Test performance is not FDA approved in patients less than 33 years old. Performed at [facility], [address], [place], [place] [address]   Culture, blood (Routine X 2) w Reflex to ID Panel     Status: None   Collection Time: 02/13/24 11:23 AM   Specimen: BLOOD LEFT HAND  Result Value Ref Range Status   Specimen Description BLOOD LEFT HAND BLOOD  Final   Special Requests AEROBIC BOTTLE ONLY Blood Culture adequate volume  Final   Culture   Final    NO GROWTH 5 DAYS Performed at [facility], [address], [place], [place] [address]    Report Status 02/18/2024 FINAL  Final  Culture, blood (Routine X 2) w Reflex to ID Panel     Status: None   Collection Time: 02/13/24 11:43 AM   Specimen: BLOOD RIGHT HAND  Result Value Ref Range Status   Specimen Description BLOOD RIGHT HAND BLOOD  Final   Special Requests AEROBIC BOTTLE ONLY Blood Culture adequate volume  Final   Culture   Final    NO GROWTH 5 DAYS Performed at [facility], [address] [place], [place] [address]    Report Status 02/18/2024 FINAL  Final  Culture, Respiratory w Gram Stain     Status: None   Collection Time: 02/13/24  8:33 PM   Specimen: Tracheal Aspirate; Respiratory  Result Value Ref Range Status   Specimen Description TRACHEAL ASPIRATE  Final   Special Requests NONE  Final   Gram Stain   Final    FEW WBC PRESENT, PREDOMINANTLY PMN FEW GRAM NEGATIVE RODS RARE BUDDING YEAST SEEN Performed at [facility], [address], [place], [place] [address]    Culture ABUNDANT PSEUDOMONAS AERUGINOSA  Final    Report Status 02/16/2024 FINAL  Final   Organism ID, Bacteria PSEUDOMONAS AERUGINOSA  Final      Susceptibility   Pseudomonas aeruginosa - MIC*    MEROPENEM 0.5 SENSITIVE Sensitive     CIPROFLOXACIN 0.12 SENSITIVE Sensitive     IMIPENEM 2 SENSITIVE Sensitive     CEFEPIME 4 SENSITIVE Sensitive     CEFTAZIDIME/AVIBACTAM 2 SENSITIVE Sensitive     CEFTOLOZANE/TAZOBACTAM 1 SENSITIVE Sensitive     TOBRAMYCIN <=1 SENSITIVE Sensitive     CEFTAZIDIME 2 SENSITIVE Sensitive     * ABUNDANT PSEUDOMONAS AERUGINOSA    Time spent: 20 minutes  Signed: Lenny Drought, MD 03-16-2024   "

## 2024-02-21 NOTE — Consult Note (Signed)
 [facility] Nephrology Consultation Note  Requesting MD: Dr. Sharie, Lenny Reason for consult: AKI, Hyperkalemia  HPI:  Debra Graves is a 69 y.o. female with past medical history significant for dyslipidemia, A-fib on Eliquis , hyperthyroidism, CAD, CHF, anxiety was initially presented to [facility] with altered mental status after sustaining multiple falls, found to have A-fib with RVR, thyroid  storm, respiratory failure seen as a consultation for the evaluation of AKI and hyperkalemia. In the ER the patient was found to have heart rate of 150s consistent with A-fib with RVR.  Started on diltiazem .  The labs notable for undetectable TSH with high T4 and T3 consistent with thyroid  storm.  Started treatment with methimazole , hydrocortisone , metoprolol .  Patient also developed encephalopathy and respiratory failure requiring mechanical ventilation.  She was transferred to [facility] for higher level of care.  She was found to have septic shock likely due to UTI versus pneumonia.  Started on Levophed  and broad-spectrum antibiotics. The baseline serum creatinine level seems to be normal however the creatinine level increasing to 3.95 associated with hyperkalemia.  The potassium level was 7.2 which was managed medically and repeat labs showed potassium level of 6.6.  The urine output is negligible around 60 cc in 24 hours.  The kidney ultrasound showed no hydronephrosis.  Because of change in mental status CT scan of head was obtained which shows no acute stroke.  Neurology team is evaluating the patient. Today's labs showed pH of 7.15, pCO2 48.6, sodium 135, potassium level 6.6, CO2 16, BUN 105, creatinine level 3.95 CK8 148, lactic acid level 2.9, WBC count 31.7 and hemoglobin of 10.5. Patient is currently intubated and sedated therefore unable to obtain review of system. I have called and spoke with patient's son Garnette about declining kidney function and need for dialysis/CRRT.  PMHx:   Past  Medical History:  Diagnosis Date   Adrenal insufficiency    Anxiety    Aortic insufficiency    Arthritis    Back pain    CAD (coronary artery disease)    Moderate ostial RCA disease at cardiac catheterization 2006   Constipation    Depression    Fibroids    Hypercholesteremia    Mild carotid artery disease    Rectocele     Past Surgical History:  Procedure Laterality Date   CARDIAC CATHETERIZATION  03/08/2004   mild progressive CAD   carotid duplex  03/09/2007   rt subclavian 0 to 49%,right bulb 0 to 49 %,lf ICA normal   COLONOSCOPY N/A 06/16/2013   Dr. Shaaron: hyperplastic polyps, melanosis coli    DOPPLER ECHOCARDIOGRAPHY  03/20/2010   EF >55 %,trace mitral regurg,mild tricuspid regurg,aortic valve mildly sclerotic'mild to moderate aortic regurg,trace pulmonic valver regurg   NM MYOCAR PERF WALL MOTION  03/19/06   EF 61%,low risk study    Family Hx:  Family History  Problem Relation Age of Onset   Breast cancer Mother    Cancer Mother 55       Breast then bones and all over   Coronary artery disease Father    Heart disease Father    Stroke Father    Hypertension Sister    Breast cancer Sister 10   Heart disease Sister    Stroke Brother    Coronary artery disease Brother    Coronary artery disease Brother    Coronary artery disease Brother    Colon cancer Neg Hx     Social History:  reports that she has quit smoking. Her smoking  use included cigarettes. She has a 20.5 pack-year smoking history. She has never used smokeless tobacco. She reports that she does not drink alcohol and does not use drugs.  Allergies: Allergies[1]  Medications: Prior to Admission medications  Medication Sig Start Date End Date Taking? Authorizing Provider  clonazePAM  (KLONOPIN ) 0.5 MG tablet Take 1 tablet (0.5 mg total) by mouth daily. 01/27/24  Yes Bevely Doffing, FNP  DULoxetine  (CYMBALTA ) 30 MG capsule Take 30 mg by mouth daily.   Yes [provider]  DULoxetine  (CYMBALTA ) 60  MG capsule Take 60 mg by mouth daily.   Yes [provider]  ELIQUIS  5 MG TABS tablet Take 1 tablet (5 mg total) by mouth 2 (two) times daily. 10/26/23  Yes Debera Jayson MATSU, MD  esomeprazole  (NEXIUM ) 20 MG capsule TAKE ONE CAPSULE BY MOUTH TWICE DAILY BEFORE A MEAL 01/12/24  Yes Ezzard Sonny RAMAN, PA-C  ezetimibe  (ZETIA ) 10 MG tablet Take 1 tablet (10 mg total) by mouth daily. 09/28/23 02/12/24 Yes Debera Jayson MATSU, MD  hydrocortisone  (CORTEF ) 10 MG tablet Take 1 tablet (10 mg total) by mouth 2 (two) times daily. 12/30/23  Yes Aletha Bene, MD  LORazepam  (ATIVAN ) 1 MG tablet Take 1 tablet (1 mg total) by mouth every 8 (eight) hours as needed for anxiety. 01/27/24  Yes Bevely Doffing, FNP  meloxicam (MOBIC) 7.5 MG tablet Take 7.5 mg by mouth 2 (two) times daily. 11/23/18  Yes [provider]  methimazole  (TAPAZOLE ) 5 MG tablet Take 1 tablet (5 mg total) by mouth daily. 12/22/23  Yes Therisa [facility] PARAS, NP  metoprolol  succinate (TOPROL -XL) 100 MG 24 hr tablet Take 1 tablet (100 mg total) by mouth daily. Take with or immediately following a meal. 09/28/23 02/12/24 Yes Debera Jayson MATSU, MD  rosuvastatin  (CRESTOR ) 40 MG tablet Take 1 tablet (40 mg total) by mouth daily. 10/23/23  Yes Debera Jayson MATSU, MD  tiZANidine  (ZANAFLEX ) 4 MG tablet Take 4 mg by mouth every 6 (six) hours. 12/30/23  Yes [provider]  Docusate Calcium  (STOOL SOFTENER PO) Take 2 tablets by mouth daily.    [provider]  fish oil-omega-3 fatty acids 1000 MG capsule Take 2 g by mouth daily.    [provider]  furosemide  (LASIX ) 20 MG tablet Take 1 tablet (20 mg total) by mouth 2 (two) times daily. 09/28/23   Debera Jayson MATSU, MD  MAGNESIUM  PO Take by mouth daily.    [provider]  potassium chloride  SA (KLOR-CON  M) 20 MEQ tablet Take 20 mEq by mouth daily. 03/25/23   [provider]  Vitamin D, Ergocalciferol, 2000 units CAPS Take 1 capsule by mouth daily.    [provider]    I have reviewed the patient's current medications.  Labs: Renal Panel: Recent Labs  Lab 02/10/24 0056 02/11/24 0454 02/12/24 1428 02/13/24 0630 02/13/24 2131 02/13/24 2356 02/14/24 0732 02/14/24 1057 March 15, 2024 0033 Mar 15, 2024 0519 03-15-24 0607  NA 138 138 142 148*   < > 142 137 137  --  137 135  K 4.0 5.1 5.3* 5.6*   < > 4.9 4.8 5.3*  --  7.2* 6.6*  CL 101 104 106 112*  --   --  105 104  --  103  --   CO2 25 23 21* 21*  --   --  16* 15*  --  16*  --   GLUCOSE 135* 144* 112* 132*  --   --  174* 165*  --  108*  --  BUN 32* 47* 63* 76*  --   --  94* 95*  --  105*  --   CREATININE 1.07* 1.28* 1.32* 1.78*  --   --  2.56* 2.88*  --  3.95*  --   CALCIUM  10.4* 10.4* 9.9 9.2  --   --  8.1* 8.1*  --  7.4*  --   MG  --  2.6* 2.6* 2.8*  --   --  2.7*  --  2.9*  --   --   PHOS 5.0*  --   --   --   --   --  5.9*  --  8.4*  --   --    < > = values in this interval not displayed.     CBC:    Latest Ref Rng & Units 03/11/2024    6:07 AM March 11, 2024   12:33 AM 02/14/2024   10:57 AM  CBC  WBC 4.0 - 10.5 K/uL  31.7  25.9   Hemoglobin 12.0 - 15.0 g/dL 89.4  88.1  88.7   Hematocrit 36.0 - 46.0 % 31.0  39.4  36.1   Platelets 150 - 400 K/uL  263  261    259      Anemia Panel:  Recent Labs    02/10/24 0811 02/11/24 0454 02/13/24 2356 02/14/24 0732 02/14/24 1057 2024-03-11 0033 March 11, 2024 0607  HGB  --    < > 10.5* 10.5* 11.2* 11.8* 10.5*  MCV  --    < >  --  90.3 89.6 91.4  --   VITAMINB12 666  --   --   --   --   --   --   FOLATE 10.2  --   --   --   --   --   --    < > = values in this interval not displayed.    Recent Labs  Lab 02/09/24 2046 02/10/24 0056 02/14/24 0732 02/14/24 1057  AST 59* 54* 2,201* 2,016*  ALT 30 28 1,501* 1,508*  ALKPHOS 74 102 104 118  BILITOT 0.8 0.8 1.0 1.0  PROT 6.0* 5.5* 5.3* 5.6*  ALBUMIN 3.2* 3.8 2.6* 2.7*    Lab Results  Component Value Date   HGBA1C 5.9 (H) 02/14/2024    ROS: Unable to obtain review of system as  patient is intubated and sedated  Physical Exam: Vitals:   Mar 11, 2024 0735 2024/03/11 0823  BP: (!) 106/48 113/75  Pulse: 72 80  Resp: (!) 26 (!) 26  Temp:    SpO2: 96% 98%     General exam: Ill looking female, intubated sedated. Respiratory system: Coarse breath sound bilateral.  Intubated.   Cardiovascular system: S1 & S2 heard, RRR.  Bilateral lower extremities pitting edema present. Gastrointestinal system: Abdomen is nondistended, soft  Central nervous system: Sedated.. Extremities: Edema present, no cyanosis.   Skin: No rashes, lesions or ulcers Psychiatry: Sedated.  Assessment/Plan:  # Acute kidney injury, oliguric likely ischemic ATN due to septic shock, A-fib with RVR. The patient remains oligoanuric associated with hyperkalemia, acidosis and evidence of fluid overload.  She is in shock requiring Levophed . -We will start CRRT with low potassium bath, UF around 50-100 cc/per hour as tolerated by BP.  Heparin  for anticoagulation.  -Discussed with ICU team and patient's son who agreed. -Continue strict ins and outs, daily lab.  -Please avoid IV contrast unless it is clinically warranted with the benefit outweighs the risk.  # Hyperkalemia: Received medical treatment including sodium bicarbonate , Lokelma .  Starting  CRRT with low K bath.  # Metabolic acidosis/lactic acid elevated septic shock: Starting dialysis as above, treated with sodium bicarbonate .  # Septic shock: Currently on Levophed , Zosyn , per ICU team.  # Anemia: Trend hemoglobin and transfuse as needed.  Thank you for the consult.  We will continue to follow.  [employer] Amelie Romney 2024/03/05, 8:56 AM  [employer].       [1]  Allergies Allergen Reactions   Albuterol    Bactrim [Sulfamethoxazole-Trimethoprim]    Vytorin [Ezetimibe -Simvastatin]     Myalgias

## 2024-02-21 NOTE — Progress Notes (Addendum)
 PHARMACY - ANTICOAGULATION CONSULT NOTE  Pharmacy Consult:  Heparin  Indication: atrial fibrillation  Allergies[1]  Patient Measurements: Height: 5' 5.98 (167.6 cm) Weight: 101 kg (222 lb 10.6 oz) IBW/kg (Calculated) : 59.26 HEPARIN  DW (KG): 80.2  Vital Signs: Temp: 98.6 F (37 C) 10-Mar-2024 0306) Temp Source: Axillary 10-Mar-2024 0306) BP: 113/75 Mar 10, 2024 0823) Pulse Rate: 80 2024/03/10 0823)  Labs: Recent Labs    02/13/24 0630 02/13/24 1123 02/14/24 0732 02/14/24 1057 02/14/24 2249 03-10-24 0033 2024/03/10 0519 Mar 10, 2024 0607 03/10/2024 0831  HGB  --    < > 10.5* 11.2*  --  11.8*  --  10.5*  --   HCT  --    < > 34.6* 36.1  --  39.4  --  31.0*  --   PLT  --    < > 247 259  261  --  263  --   --   --   APTT  --   --  39* 38* 63*  --   --   --   --   LABPROT  --   --  61.1* 52.9*  --   --   --   --  80.0*  INR  --   --  6.7* 5.6*  --   --   --   --  9.5*  HEPARINUNFRC  --   --   --   --  >1.10*  --   --   --   --   CREATININE 1.78*  --  2.56* 2.88*  --   --  3.95*  --  4.10*  CKTOTAL 538*  --  848*  --   --   --   --   --  977*   < > = values in this interval not displayed.    Estimated Creatinine Clearance: 15.5 mL/min (A) (by C-G formula based on SCr of 4.1 mg/dL (H)).   Assessment: 50 YOF with thyroid  storm and Afib RVR.  Pharmacy consulted to dose IV heparin  while Eliquis  is on hold given plan for possible cardioversion (last dose 1/24 PM).    Heparin  off this AM due to change in mentation.  CT and CTA negative; therefore, patient to resume IV heparin  post line placement.  INR trending up along with worsening liver function and in setting of recent Eliquis  use.  CBC stable; no bleeding reported.  Goal of Therapy:   Heparin  level 0.3-0.7 units/ml aPTT 66-102 seconds Monitor platelets by anticoagulation protocol: Yes   Plan:  No bolus per MD Resume IV heparin  at 1100 units/hr Check 8 hr aPTT Daily heparin  level, aPTT and CBC Vitamin K  10mg  IV x 1  Caiya Bettes D. Lendell, PharmD,  BCPS, BCCCP 03-10-24, 9:54 AM   ===================  Addendum: Weighing risk and benefit, Cards rec to hold off on IV heparin .  Diandre Merica D. Lendell, PharmD, BCPS, BCCCP 10-Mar-2024, 10:05 AM     [1]  Allergies Allergen Reactions   Albuterol    Bactrim [Sulfamethoxazole-Trimethoprim]    Vytorin [Ezetimibe -Simvastatin]     Myalgias

## 2024-02-21 NOTE — Progress Notes (Signed)
Stat  EEG complete - results pending.  

## 2024-02-21 NOTE — Progress Notes (Addendum)
 NOK- Son's number is not listed accurately, as [phone].  Attempted to call brother, Darina [phone], no answer.  Darin Pool [phone] straight to generic VM.    Will continue to attempt to contact family.  Addendum> spoke and updated son, Christopher by phone.  Number verified> [phone].     Lyle Pesa, NP [facility] 2024/02/17, 10:09 AM  See Amion for pager If no response to pager , please call [phone] until 7pm After 7:00 pm call Elink  [phone]

## 2024-02-21 NOTE — Progress Notes (Signed)
" °   26-Feb-2024 2050  Spiritual Encounters  Type of Visit Initial  Care provided to: Pt not available  Referral source Code page  Reason for visit Code  OnCall Visit No   Chaplain responded to a code blue.  No family present.   If a chaplain is requested someone will respond.   Carley [facility]  [phone] "

## 2024-02-21 NOTE — Progress Notes (Signed)
 Nutrition Brief Note  Consult received for initiation of CRRT.  Remains intubated on vent support.  SAT/SBT deferred d/t change in mental status.  Septic shock in setting of UTI and possible cardiogenic component.    Medications: solu-cortef  q8h, SSI 0-20 units q4h, MVI, protonix , miralax  daily, senna BID, sodium bicarbonate  TID, lokelma  (1/25-1/26), thiamine  Drips: Levo @ 10mcg/min (increased from 1mcg/min)  Cuff MAPs variable 57-48mmHg x 12 hours  Labs:  Potassium 6.2 BUN 106 Cr 4.10 Anion gap 16 Ionized calcium  0.94 Phosphorus 8.4 Mg 2.9 Alkaline phosphatase 188 Albumin 2.4 AST 3988 ALT 1737 Total protein 5.0 GFR 11 Lactic acid 4.1 (up from 2.5 yesterday) CBG's 96-177 x24 hours  Full RD assessment note complete yesterday.  Patient initiated on trickle tube feeds via NGT yesterday.  Still no BM reported. +miralax  and senna. May need to consider escalation of bowel regimen.  Will continue trickle tube feeds with prosource oral today and re-assess labs and bowel movements tomorrow for ability to advance to goal.   Royce Maris, RDN, LDN Clinical Nutrition See AMiON for contact information.

## 2024-02-21 NOTE — Procedures (Signed)
 Central Venous Catheter Insertion Procedure Note  Debra Graves  [id number]  [birth year]  Date:February 19, 2024  Time:11:20 AM   Provider Performing:Desiderio Dolata   Procedure: Insertion of Non-tunneled Central Venous Catheter(36556) with US  guidance (23062)   Indication(s) Hemodialysis  Consent Unable to obtain consent due to emergent nature of procedure.  Anesthesia Topical only with 1% lidocaine    Timeout Verified patient identification, verified procedure, site/side was marked, verified correct patient position, special equipment/implants available, medications/allergies/relevant history reviewed, required imaging and test results available.  Sterile Technique Maximal sterile technique including full sterile barrier drape, hand hygiene, sterile gown, sterile gloves, mask, hair covering, sterile ultrasound probe cover (if used).  Procedure Description Area of catheter insertion was cleaned with chlorhexidine  and draped in sterile fashion.  With real-time ultrasound guidance a HD catheter was placed into the right internal jugular vein. Nonpulsatile blood flow and easy flushing noted in all ports.  The catheter was sutured in place and sterile dressing applied.  Complications/Tolerance None; patient tolerated the procedure well. Chest X-ray is ordered to verify placement for internal jugular or subclavian cannulation.   Chest x-ray is not ordered for femoral cannulation.  EBL Less than 5cc  Specimen(s) None

## 2024-02-21 NOTE — Progress Notes (Signed)
 VENTILATOR WEAN NOTE 03/12/24  Start Mode: PRVC  Wean Mode: Pressure Support  Duration before failure: 0 minutes  Reason for failure: Other: 100% FiO2.  Notes: Patient not a wean candidate at this time. BP low, 100% FiO2.

## 2024-02-21 NOTE — TOC Progression Note (Addendum)
 Transition of Care [facility]) - Progression Note    Patient Details  Name: Debra Graves MRN: [id number] Date of Birth: March 25, 1955  Transition of Care [facility]) CM/SW Contact  Lendia Dais, [place] Phone Number: 02-19-24, 11:29 AM  Clinical Narrative: Pt is not medically stable. Pt is pending extubation for further bed offers. For SNF pt must be without a sitter for 24-48 hours. Safety sitter d/c on 1/24.   CSW will continue to monitor.    Expected Discharge Plan: Skilled Nursing Facility Barriers to Discharge: Continued Medical Work up               Expected Discharge Plan and Services In-house Referral: Clinical Social Work   Post Acute Care Choice: Skilled Nursing Facility Living arrangements for the past 2 months: Single Family Home                                       Social Drivers of Health (SDOH) Interventions SDOH Screenings   Food Insecurity: Patient Unable To Answer (02/09/2024)  Housing: Unknown (02/09/2024)  Transportation Needs: No Transportation Needs (02/09/2024)  Utilities: Not At Risk (02/09/2024)  Depression (PHQ2-9): Low Risk (01/27/2024)  Social Connections: Unknown (02/09/2024)  Tobacco Use: Medium Risk (02/09/2024)    Readmission Risk Interventions    02/10/2024    2:53 PM  Readmission Risk Prevention Plan  Transportation Screening Complete  Home Care Screening Complete  Medication Review (RN CM) Complete

## 2024-02-21 NOTE — Progress Notes (Addendum)
 eLink Physician-Brief Progress Note Patient Name: ADALI PENNINGS DOB: Apr 05, 1955 MRN: [id number]   Date of Service  Mar 08, 2024  HPI/Events of Note  Notified that pupils were slightly unequal: 5mm on R, 4mm on L. Both pupils were reported to be reactive.  Sedation had just been held, pt has not waken up yet.   eICU Interventions  Will continue to hold sedation for now, continue to monitor mental status.  If persistently obtunded, would require repeat brain imaging.          Messiyah Waterson M DELA CRUZ Mar 08, 2024, 5:03 AM  5:35 AM Pt had been weaned from levophed  overnight, but it has been restarted in the early AM, now back up to 9mcg/min.  No associated tachycardia, no obvious bleed noted.  Pt with decreased UO, already 6.5liters positive based on I/Os Already on stress dose hydrocortisone  as well.  Will follow up AM labs, ABG.  Shock may still be cardiogenic in nature. Echo remains pending.   6:19 AM Notified of hyperkalemia, metabolic acidosis.  Placed order for insulin +dextrose , lokelma , bicarb, calcium  gluconate.  Maintain on continuous cardiac monitoring.  Will follow serial BMP.

## 2024-02-21 NOTE — Procedures (Signed)
 Arterial Catheter Insertion Procedure Note  Debra Graves  [id number]  Dec 03, 1955  Date:03/16/24  Time:4:23 PM    Provider Performing: Lenny Drought    Procedure: Insertion of Arterial Line (63379) with US  guidance (23062)   Indication(s) Blood pressure monitoring and/or need for frequent ABGs  Consent Unable to obtain consent due to emergent nature of procedure.  Anesthesia Lidocaine , patient also on sedation    Time Out Verified patient identification, verified procedure, site/side was marked, verified correct patient position, special equipment/implants available, medications/allergies/relevant history reviewed, required imaging and test results available.   Sterile Technique Maximal sterile technique including full sterile barrier drape, hand hygiene, sterile gown, sterile gloves, mask, hair covering, sterile ultrasound probe cover (if used).   Procedure Description Area of catheter insertion was cleaned with chlorhexidine  and draped in sterile fashion. With real-time ultrasound guidance an arterial catheter was placed into the left axillary artery.  Appropriate arterial tracings confirmed on monitor.     Complications/Tolerance None; patient tolerated the procedure well.   EBL Less than 2 cc   Specimen(s) None

## 2024-02-21 NOTE — Progress Notes (Signed)
 OT Cancellation Note  Patient Details Name: MIKAELA HILGEMAN MRN: [id number] DOB: 1955/07/05   Cancelled Treatment:    Reason Eval/Treat Not Completed: Patient not medically ready Pt remains on vent. Not yet ready for therapy evals. Will sign off for acute OT. Please reconsult when pt ready for PT/OT  Mliss Fish 2024-03-12, 1:01 PM

## 2024-02-21 NOTE — Progress Notes (Signed)
 "   Progress Note  Patient Name: Debra Graves Date of Encounter: February 25, 2024  Primary Cardiologist:   Jayson Sierras, MD   Subjective   Intubated, unresponsive.   Inpatient Medications    Scheduled Meds:  Chlorhexidine  Gluconate Cloth  6 each Topical Daily   feeding supplement (PROSource TF20)  60 mL Per Tube TID   hydrocortisone  sod succinate (SOLU-CORTEF ) inj  100 mg Intravenous Q6H   insulin  aspart  0-20 Units Subcutaneous Q4H   ipratropium  0.5 mg Nebulization Q6H   methimazole   20 mg Per Tube Q6H   midodrine   10 mg Per Tube Q8H   multivitamin with minerals  1 tablet Per Tube Daily   mouth rinse  15 mL Mouth Rinse Q2H   pantoprazole  (PROTONIX ) IV  40 mg Intravenous Q24H   polyethylene glycol  17 g Per Tube Daily   propranolol   60 mg Per Tube Q4H   senna  1 tablet Per Tube BID   sodium bicarbonate   650 mg Per Tube TID   sodium zirconium cyclosilicate   10 g Per Tube TID   sodium zirconium cyclosilicate   10 g Per Tube Once   thiamine   100 mg Per Tube Daily   Continuous Infusions:  calcium  gluconate     feeding supplement (OSMOLITE 1.5 CAL) 20 mL/hr at 2024-02-25 0741   fentaNYL  infusion INTRAVENOUS 25 mcg/hr (25-Feb-2024 0740)   norepinephrine  (LEVOPHED ) Adult infusion 8 mcg/min (2024-02-25 0741)   piperacillin -tazobactam (ZOSYN )  IV     propofol  (DIPRIVAN ) infusion Stopped (02/25/2024 0405)   PRN Meds: acetaminophen , clonazePAM , mouth rinse, mouth rinse, polyethylene glycol, prochlorperazine    Vital Signs    Vitals:   02-25-24 0615 25-Feb-2024 0630 2024-02-25 0645 02-25-2024 0700  BP: (!) 90/44 97/67 (!) 114/46 (!) 104/46  Pulse: 83 81 81 83  Resp: (!) 31 (!) 26 (!) 26 (!) 32  Temp:      TempSrc:      SpO2: 90% 91% 93% (!) 88%  Weight:      Height:        Intake/Output Summary (Last 24 hours) at 2024-02-25 0834 Last data filed at 02-25-24 0741 Gross per 24 hour  Intake 1285.51 ml  Output 60 ml  Net 1225.51 ml   Filed Weights   02/13/24 2002 02/14/24 0453  02/25/24 0356  Weight: 94.4 kg 98.4 kg 101 kg    Telemetry    Atrial fib with controlled ventricular rate - Personally Reviewed  ECG    Atrial fib with controlled rate.  QT prolonged compared to previous, early transition.  Deep T wave inversion in the anterior leads new compared to previous.   - Personally Reviewed  Physical Exam   GEN:    Critically ill intubated and sedated.  Neck: No  JVD Cardiac: Irregular RR, no murmurs, rubs, or gallops.  Respiratory:   Decreased breath sounds GI: Soft non-distended  MS: Diffuse edema Neuro:  Non responsive   Labs    Chemistry Recent Labs  Lab 02/10/24 0056 02/11/24 0454 02/14/24 0732 02/14/24 1057 2024-02-25 0519 Feb 25, 2024 0607  NA 138   < > 137 137 137 135  K 4.0   < > 4.8 5.3* 7.2* 6.6*  CL 101   < > 105 104 103  --   CO2 25   < > 16* 15* 16*  --   GLUCOSE 135*   < > 174* 165* 108*  --   BUN 32*   < > 94* 95* 105*  --   CREATININE  1.07*   < > 2.56* 2.88* 3.95*  --   CALCIUM  10.4*   < > 8.1* 8.1* 7.4*  --   PROT 5.5*  --  5.3* 5.6*  --   --   ALBUMIN 3.8  --  2.6* 2.7*  --   --   AST 54*  --  2,201* 2,016*  --   --   ALT 28  --  1,501* 1,508*  --   --   ALKPHOS 102  --  104 118  --   --   BILITOT 0.8  --  1.0 1.0  --   --   GFRNONAA 56*   < > 20* 17* 12*  --   ANIONGAP 12   < > 15 17* 18*  --    < > = values in this interval not displayed.     Hematology Recent Labs  Lab 02/14/24 0732 02/14/24 1057 02-21-24 0033 2024-02-21 0607  WBC 23.5* 25.9* 31.7*  --   RBC 3.83* 4.03 4.31  --   HGB 10.5* 11.2* 11.8* 10.5*  HCT 34.6* 36.1 39.4 31.0*  MCV 90.3 89.6 91.4  --   MCH 27.4 27.8 27.4  --   MCHC 30.3 31.0 29.9*  --   RDW 14.8 14.7 14.4  --   PLT 247 259  261 263  --     Cardiac EnzymesNo results for input(s): TROPONINI in the last 168 hours. No results for input(s): TROPIPOC in the last 168 hours.   BNPNo results for input(s): BNP, PROBNP in the last 168 hours.   DDimer  Recent Labs  Lab  02/14/24 1057  DDIMER 2.41*     Radiology    CT HEAD WO CONTRAST ( ) Result Date: 02-21-24 EXAM: CT HEAD WITHOUT CONTRAST 02/21/24 08:23:26 AM TECHNIQUE: CT of the head was performed without the administration of intravenous contrast. Automated exposure control, iterative reconstruction, and/or weight based adjustment of the mA/kV was utilized to reduce the radiation dose to as low as reasonably achievable. COMPARISON: CT head 02/09/2024. CLINICAL HISTORY: 69 year old female with pupillary changes, altered mental status, atrial fibrillation, recent falls, and dizziness. FINDINGS: BRAIN AND VENTRICLES: No acute hemorrhage. No evidence of acute infarct. No hydrocephalus. No extra-axial collection. No mass effect or midline shift. Stable vascular and dystrophic calcifications at the bilateral basal ganglia. Background brain volume is stable, within normal limits for age. Elnor white differentiation is stable with mild for age periventricular hypodensity. No suspicious intracranial vascular hyperdensity. Calcified atherosclerosis at the skull base. ORBITS: No gaze deviation. SINUSES: Left nasoenteric tube in place. Partially opacified nasal cavity and opacified nasopharynx. New paranasal sinus opacification including small fluid levels. Tympanic cavities and mastoids remain well aerated. SOFT TISSUES AND SKULL: No acute soft tissue abnormality. No skull fracture. IMPRESSION: 1. Stable and largely negative for age non-contrast CT appearance of the brain. 2. New left nasoenteric tube, paranasal sinus opacification. Electronically signed by: Helayne Hurst MD 2024/02/21 08:30 AM EST RP Workstation: [id number]   ECHOCARDIOGRAM COMPLETE Result Date: 02/14/2024    ECHOCARDIOGRAM REPORT   Patient Name:   Debra Graves Date of Exam: 02/14/2024 Medical Rec #:  [id number]       Height:       66.0 in Accession #:    [id number]      Weight:       216.9 lb Date of Birth:  Jun 20, 1955       BSA:          2.070 m Patient  Age:  68 years        BP:           105/40 mmHg Patient Gender: F               HR:           53 bpm. Exam Location:  Inpatient Procedure: 2D Echo, Cardiac Doppler and Color Doppler (Both Spectral and Color            Flow Doppler were utilized during procedure). Indications:    AFIB I48.91  History:        Patient has prior history of Echocardiogram examinations, most                 recent 06/13/2022. AFIB; Signs/Symptoms:Chest Pain.  Sonographer:    Nathanel Devonshire Referring Phys: [id number] OMAR M ALBUSTAMI IMPRESSIONS  1. Left ventricular ejection fraction, by estimation, is 45 to 50%. The left ventricle has mildly decreased function. The left ventricle demonstrates global hypokinesis. Left ventricular diastolic function could not be evaluated.  2. Right ventricular systolic function is mildly reduced. The right ventricular size is normal. There is normal pulmonary artery systolic pressure. The estimated right ventricular systolic pressure is 33.1 mmHg.  3. Left atrial size was mildly dilated.  4. The mitral valve is normal in structure. Mild mitral valve regurgitation. No evidence of mitral stenosis.  5. The aortic valve is tricuspid. Aortic valve regurgitation is moderate. No aortic stenosis is present. FINDINGS  Left Ventricle: Left ventricular ejection fraction, by estimation, is 45 to 50%. The left ventricle has mildly decreased function. The left ventricle demonstrates global hypokinesis. The left ventricular internal cavity size was normal in size. There is  no left ventricular hypertrophy. Left ventricular diastolic function could not be evaluated due to atrial fibrillation. Left ventricular diastolic function could not be evaluated. Right Ventricle: The right ventricular size is normal. No increase in right ventricular wall thickness. Right ventricular systolic function is mildly reduced. There is normal pulmonary artery systolic pressure. The tricuspid regurgitant velocity is 2.65 m/s, and with an assumed  right atrial pressure of 5 mmHg, the estimated right ventricular systolic pressure is 33.1 mmHg. Left Atrium: Left atrial size was mildly dilated. Right Atrium: Right atrial size was normal in size. Pericardium: There is no evidence of pericardial effusion. Mitral Valve: The mitral valve is normal in structure. Mild mitral valve regurgitation, with centrally-directed jet. No evidence of mitral valve stenosis. Tricuspid Valve: The tricuspid valve is normal in structure. Tricuspid valve regurgitation is trivial. Aortic Valve: The aortic valve is tricuspid. Aortic valve regurgitation is moderate. Aortic regurgitation PHT measures 138 msec. No aortic stenosis is present. Aortic valve mean gradient measures 11.0 mmHg. Aortic valve peak gradient measures 20.6 mmHg. Aortic valve area, by VTI measures 1.89 cm. Pulmonic Valve: The pulmonic valve was grossly normal. Pulmonic valve regurgitation is trivial. No evidence of pulmonic stenosis. Aorta: The aortic root is normal in size and structure. Venous: IVC assessment for right atrial pressure unable to be performed due to mechanical ventilation. IAS/Shunts: No atrial level shunt detected by color flow Doppler.  LEFT VENTRICLE PLAX 2D LVIDd:         4.80 cm     Diastology LVIDs:         3.20 cm     LV e' medial:   7.18 cm/s LV PW:         0.90 cm     LV E/e' medial: 11.9 LV IVS:  1.00 cm LVOT diam:     1.97 cm LV SV:         60 LV SV Index:   29 LVOT Area:     3.05 cm  LV Volumes (MOD) LV vol d, MOD A2C: 80.0 ml LV vol d, MOD A4C: 73.4 ml LV vol s, MOD A2C: 31.7 ml LV vol s, MOD A4C: 31.7 ml LV SV MOD A2C:     48.3 ml LV SV MOD A4C:     73.4 ml LV SV MOD BP:      46.7 ml RIGHT VENTRICLE          IVC RV Basal diam:  3.44 cm  IVC diam: 1.67 cm TAPSE (M-mode): 1.3 cm LEFT ATRIUM             Index        RIGHT ATRIUM           Index LA diam:        3.22 cm 1.56 cm/m   RA Area:     11.40 cm LA Vol (A2C):   48.1 ml 23.24 ml/m  RA Volume:   27.70 ml  13.38 ml/m LA Vol  (A4C):   42.6 ml 20.58 ml/m LA Biplane Vol: 47.2 ml 22.80 ml/m  AORTIC VALVE                     PULMONIC VALVE AV Area (Vmax):    1.89 cm      PV Vmax:       1.71 m/s AV Area (Vmean):   1.61 cm      PV Peak grad:  11.7 mmHg AV Area (VTI):     1.89 cm AV Vmax:           227.00 cm/s AV Vmean:          156.000 cm/s AV VTI:            0.316 m AV Peak Grad:      20.6 mmHg AV Mean Grad:      11.0 mmHg LVOT Vmax:         141.00 cm/s LVOT Vmean:        82.300 cm/s LVOT VTI:          0.196 m LVOT/AV VTI ratio: 0.62 AI PHT:            138 msec  AORTA Ao Root diam: 2.94 cm MV E velocity: 85.40 cm/s  TRICUSPID VALVE                            TR Peak grad:   28.1 mmHg                            TR Vmax:        265.00 cm/s                             SHUNTS                            Systemic VTI:  0.20 m                            Systemic Diam: 1.97 cm Jerel Croitoru MD Electronically signed by Jerel Balding MD Signature  Date/Time: 02/14/2024/3:37:53 PM    Final    DG CHEST PORT 1 VIEW Result Date: 02/14/2024 CLINICAL DATA:  Respiratory failure. EXAM: PORTABLE CHEST 1 VIEW COMPARISON:  02/13/2024 FINDINGS: Endotracheal tube tip is approximately 2.1 cm above the base of the carina. The NG tube passes into the stomach although the distal tip position is not included on the film. The cardio pericardial silhouette is enlarged. Interval improvement in aeration at the right base with persistent retrocardiac left base collapse/consolidation. Nonacute posterior left seventh rib fracture evident. IMPRESSION: Interval improvement in aeration at the right base with persistent retrocardiac left base collapse/consolidation. Electronically Signed   By: Camellia Candle M.D.   On: 02/14/2024 08:16   DG CHEST PORT 1 VIEW Result Date: 02/13/2024 EXAM: 1 VIEW(S) XRAY OF THE CHEST 02/13/2024 06:46:09 PM COMPARISON: 02/13/2024 CLINICAL HISTORY: Intubation of airway performed without difficulty. FINDINGS: LINES, TUBES AND DEVICES:  Endotracheal tube in place with tip 3.1 cm above the carina. Enteric tube in place with distal tip beyond the inferior margin of the film within the stomach. LUNGS AND PLEURA: Right basilar airspace opacities. Possible small right pleural effusion. No pneumothorax. HEART AND MEDIASTINUM: No acute abnormality of the cardiac and mediastinal silhouettes. Hiatal hernia. BONES AND SOFT TISSUES: No acute osseous abnormality. IMPRESSION: 1. Endotracheal tube and enteric tube in appropriate position. 2. Right basilar airspace opacities and possible small right pleural effusion. Electronically signed by: Oneil Devonshire MD 02/13/2024 06:53 PM EST RP Workstation: [id number]   US  RENAL Result Date: 02/13/2024 EXAM: Renal/Urinary Tract Ultrasound. CLINICAL HISTORY: Acute kidney injury (AKI). ICD10: [id number]. TECHNIQUE: Real-time ultrasound of the retroperitoneum (complete) with image documentation. COMPARISON: None provided. FINDINGS: RIGHT KIDNEY: Right Kidney measures 9.0 x 4.1 x 5.3 cm with a total volume of 102 ml. No hydronephrosis, renal stone, or mass visualized. LEFT KIDNEY: Left Kidney measures 8.9 x 5.1 x 4.8 cm with a total volume of 114 ml. No hydronephrosis, renal stone, or mass visualized. BLADDER: Empty urinary bladder. LIMITATIONS: Reduced sensitivity due to cooperation issues and motion. IMPRESSION: 1. No hydronephrosis. 2. Reduced sensitivity due to motion and limited patient cooperation. Electronically signed by: Ryan Salvage MD 02/13/2024 12:32 PM EST RP Workstation: [id number]   DG CHEST PORT 1 VIEW Result Date: 02/13/2024 EXAM: 1 VIEW(S) XRAY OF THE CHEST 02/13/2024 11:55:05 AM COMPARISON: 02/09/2024 CLINICAL HISTORY: Acute respiratory failure with hypoxia. FINDINGS: LINES, TUBES AND DEVICES: Enteric tube extends into the stomach. LUNGS AND PLEURA: Lower lung volumes. No focal pulmonary opacity. No pleural effusion. No pneumothorax. HEART AND MEDIASTINUM: Mild enlargement of the cardiopericardial  silhouette. BONES AND SOFT TISSUES: Moderate sized hiatal hernia. No acute osseous abnormality. IMPRESSION: 1. Low lung volumes. 2. Mild enlargement of the cardiopericardial silhouette. 3. Enteric tube tip in the stomach. 4. Moderate-sized hiatal hernia. Electronically signed by: Ryan Salvage MD 02/13/2024 12:30 PM EST RP Workstation: [id number]    Cardiac Studies   See above.   Patient Profile     69 y.o. female with a hx of CAD, paroxysmal atrial fibrillation, carotid artery plaque, aortic valve insufficiency, HLD, hypothyroidism/hyperthyroidism (follow-up by endocrinology) who is being seen 02/10/2024 for the evaluation of A-fib with RVR at the request of Dr. Evonnie.   Assessment & Plan    A-fib with RVR:   Avoiding amio.  Eliquis  held secondary to acute illness.  Was on heparin .    Held to evaluate change in neuro exam and status.  No acute bleed on head CT.  Still undergoing neuro work up for acute change  in mental status.  I am not considering DCCV as rate is controlled and with severe thyroid  and other acute issues not likely to benefit and recurrence would be likely.  INR is 6.7.  I would continue to hold Heparin  today.    Leukocytosis/shock:  Repeat echo with mildly reduced EF compared to 2024.  Likely related acute arrhythmia , thyrotoxicosis, and shock rather than the cause of the shock.   No critical valve disease.   Vasopressor support  Acute hypoxic respiratory failure:    EKG with atrial fib with controlled rate.  QT markedly prolonged.    Mag WNL, potassium being corrected (hyperkalemia) .  Support per CCM.     Chronic HFpEF:  EF newly reduced to 45% from 65% 2024.  Likely related to multiple causes with thyrotoxicosis, sepsis syndrome, atrial fib with rapid rate.  Supportive care limited by hypotension.   EKG is consistent with a Takotsubo presentation and the clinical scenario supports this.   CRRT planned for volume management   Thyrotoxicosis:   Home meds include methimazole   but compliance is unknown.  Currently on methimazole     CAD:  Trop minimally elevated for this situation.  Clinical scenario is not consistent with acute ischemic disease as presenting complaint.   CKD/AKI:   Worsening oliguria.  She will likely need CRRT.  Correcting hyperkalemia.     For questions or updates, please contact [facility] Please consult [email or web address] for contact info under Cardiology/STEMI.   Signed, Lynwood Schilling, MD  Feb 24, 2024, 8:34 AM    "

## 2024-02-21 NOTE — Progress Notes (Signed)
 PHARMACY NOTE:  ANTIMICROBIAL RENAL DOSAGE ADJUSTMENT  Current antimicrobial regimen includes a mismatch between antimicrobial dosage and estimated renal function.  As per policy approved by the Pharmacy & Therapeutics and Medical Executive Committees, the antimicrobial dosage will be adjusted accordingly.  Current antimicrobial dosage:  Zosyn  2.25gm IV Q6H  Indication: CAP and UTI  Renal Function:  Estimated Creatinine Clearance: 15.5 mL/min (A) (by C-G formula based on SCr of 4.1 mg/dL (H)). []      On intermittent HD, scheduled: [x]      On CRRT    Antimicrobial dosage has been changed to:  Zosyn  3.375gm IV Q6H, 30 min infusion  Kerensa Nicklas D. Lendell, PharmD, BCPS, BCCCP 03-02-24, 10:00 AM

## 2024-02-21 NOTE — Progress Notes (Signed)
 RT assisted with patient transport on vent to CT and returned to 3M04 without complications.

## 2024-02-21 NOTE — Procedures (Signed)
 Patient Name: Debra Graves  MRN: [id number]  Epilepsy Attending: Arlin MALVA Krebs  Referring Physician/Provider: Voncile Isles, MD  Date: February 27, 2024 Duration: 22.21 mins  Patient history: 69yo F with ams. EEG to evaluate for seizure  Level of alertness:  comatose/ lethargic   AEDs during EEG study: None  Technical aspects: This EEG study was done with scalp electrodes positioned according to the 10-20 International system of electrode placement. Electrical activity was reviewed with band pass filter of 1-70Hz , sensitivity of 7 uV/mm, display speed of 46mm/sec with a 60Hz  notched filter applied as appropriate. EEG data were recorded continuously and digitally stored.  Video monitoring was available and reviewed as appropriate.  Description: EEG showed near continuous generalized 3 to 6 Hz theta-delta slowing with triphasic morphology admixed with 12-14hz  beta activity. Brief 1-2 seconds of generalized attenuation was also ntoed. Hyperventilation and photic stimulation were not performed.     ABNORMALITY - Continuous slow, generalized  IMPRESSION: This study is suggestive of severe diffuse encephalopathy, nonspecific etiology but likely related to toxic-metabolic causes. No seizures or definite epileptiform discharges were seen throughout the recording.  Debra Graves

## 2024-02-21 NOTE — Plan of Care (Signed)
" °  Problem: Education: Goal: Knowledge of General Education information will improve Description: Including pain rating scale, medication(s)/side effects and non-pharmacologic comfort measures Outcome: Not Progressing   Problem: Health Behavior/Discharge Planning: Goal: Ability to manage health-related needs will improve Outcome: Not Progressing   Problem: Clinical Measurements: Goal: Ability to maintain clinical measurements within normal limits will improve Outcome: Not Progressing Goal: Will remain free from infection Outcome: Not Progressing Goal: Diagnostic test results will improve Outcome: Not Progressing Goal: Respiratory complications will improve Outcome: Not Progressing Goal: Cardiovascular complication will be avoided Outcome: Not Progressing   Problem: Activity: Goal: Risk for activity intolerance will decrease Outcome: Not Progressing   Problem: Nutrition: Goal: Adequate nutrition will be maintained Outcome: Not Progressing   Problem: Coping: Goal: Level of anxiety will decrease Outcome: Not Progressing   Problem: Elimination: Goal: Will not experience complications related to bowel motility Outcome: Not Progressing Goal: Will not experience complications related to urinary retention Outcome: Not Progressing   Problem: Pain Managment: Goal: General experience of comfort will improve and/or be controlled Outcome: Not Progressing   Problem: Safety: Goal: Ability to remain free from injury will improve Outcome: Not Progressing   Problem: Skin Integrity: Goal: Risk for impaired skin integrity will decrease Outcome: Not Progressing   Problem: Education: Goal: Knowledge of disease or condition will improve Outcome: Not Progressing Goal: Understanding of medication regimen will improve Outcome: Not Progressing Goal: Individualized Educational Video(s) Outcome: Not Progressing   Problem: Activity: Goal: Ability to tolerate increased activity will  improve Outcome: Not Progressing   Problem: Cardiac: Goal: Ability to achieve and maintain adequate cardiopulmonary perfusion will improve Outcome: Not Progressing   Problem: Health Behavior/Discharge Planning: Goal: Ability to safely manage health-related needs after discharge will improve Outcome: Not Progressing   Problem: Safety: Goal: Non-violent Restraint(s) Outcome: Not Progressing   Problem: Education: Goal: Ability to describe self-care measures that may prevent or decrease complications (Diabetes Survival Skills Education) will improve Outcome: Not Progressing Goal: Individualized Educational Video(s) Outcome: Not Progressing   Problem: Coping: Goal: Ability to adjust to condition or change in health will improve Outcome: Not Progressing   Problem: Fluid Volume: Goal: Ability to maintain a balanced intake and output will improve Outcome: Not Progressing   Problem: Health Behavior/Discharge Planning: Goal: Ability to identify and utilize available resources and services will improve Outcome: Not Progressing Goal: Ability to manage health-related needs will improve Outcome: Not Progressing   Problem: Metabolic: Goal: Ability to maintain appropriate glucose levels will improve Outcome: Not Progressing   Problem: Nutritional: Goal: Maintenance of adequate nutrition will improve Outcome: Not Progressing Goal: Progress toward achieving an optimal weight will improve Outcome: Not Progressing   Problem: Skin Integrity: Goal: Risk for impaired skin integrity will decrease Outcome: Not Progressing   Problem: Tissue Perfusion: Goal: Adequacy of tissue perfusion will improve Outcome: Not Progressing   "

## 2024-02-21 NOTE — Consult Note (Signed)
 NEUROLOGY CONSULT NOTE   Date of service: 03-06-24 Patient Name: Debra Graves MRN:  [id number] DOB:  1955/03/01 Chief Complaint: Patient unresponsive with unequal pupils Requesting Provider: Sharie Bourbon, MD  History of Present Illness  Debra Graves is a 69 y.o. female with hx of A-fib on Eliquis , hypothyroidism, hyperlipidemia, CAD, CHF, CKD and anxiety who originally presented with AMS after multiple falls.  She was found to be in thyroid  storm and A-fib with RVR.  She was started on diltiazem  and heparin  and was intubated.  She was also found to have UTI with AKI and is pending initiation of CRRT.  Earlier in the evening, she was noted to be able to follow commands on low-dose propofol .  However, at this point she is unable to follow commands is not responding to noxious stimuli and right pupil is 6 mm and nonreactive.    LKW: 0130 ?? Modified rankin score: 2-Slight disability-UNABLE to perform all activities but does not need assistance IV Thrombolysis: No, on therapeutic heparin  EVT: No, no LVO   NIHSS components Score: Comment  1a Level of Conscious 0[]  1[]  2[]  3[x]      1b LOC Questions 0[]  1[]  2[x]       1c LOC Commands 0[]  1[]  2[x]       2 Best Gaze 0[x]  1[]  2[]       3 Visual 0[x]  1[]  2[]  3[]      4 Facial Palsy 0[x]  1[]  2[]  3[]      5a Motor Arm - left 0[]  1[]  2[]  3[]  4[x]  UN[]    5b Motor Arm - Right 0[]  1[]  2[]  3[]  4[x]  UN[]    6a Motor Leg - Left 0[]  1[]  2[]  3[]  4[x]  UN[]    6b Motor Leg - Right 0[]  1[]  2[]  3[]  4[x]  UN[]    7 Limb Ataxia 0[x]  1[]  2[]  UN[]      8 Sensory 0[]  1[]  2[x]  UN[]      9 Best Language 0[]  1[]  2[]  3[x]      10 Dysarthria 0[]  1[]  2[x]  UN[]      11 Extinct. and Inattention 0[x]  1[]  2[]       TOTAL:30       ROS   Unable to ascertain due to patient being intubated and unresponsive  Past History   Past Medical History:  Diagnosis Date   Adrenal insufficiency    Anxiety    Aortic insufficiency    Arthritis    Back pain    CAD  (coronary artery disease)    Moderate ostial RCA disease at cardiac catheterization 2006   Constipation    Depression    Fibroids    Hypercholesteremia    Mild carotid artery disease    Rectocele     Past Surgical History:  Procedure Laterality Date   CARDIAC CATHETERIZATION  03/08/2004   mild progressive CAD   carotid duplex  03/09/2007   rt subclavian 0 to 49%,right bulb 0 to 49 %,lf ICA normal   COLONOSCOPY N/A 06/16/2013   Dr. Shaaron: hyperplastic polyps, melanosis coli    DOPPLER ECHOCARDIOGRAPHY  03/20/2010   EF >55 %,trace mitral regurg,mild tricuspid regurg,aortic valve mildly sclerotic'mild to moderate aortic regurg,trace pulmonic valver regurg   NM MYOCAR PERF WALL MOTION  03/19/06   EF 61%,low risk study    Family History: Family History  Problem Relation Age of Onset   Breast cancer Mother    Cancer Mother 59       Breast then bones and all over   Coronary artery disease Father    Heart  disease Father    Stroke Father    Hypertension Sister    Breast cancer Sister 32   Heart disease Sister    Stroke Brother    Coronary artery disease Brother    Coronary artery disease Brother    Coronary artery disease Brother    Colon cancer Neg Hx     Social History  reports that she has quit smoking. Her smoking use included cigarettes. She has a 20.5 pack-year smoking history. She has never used smokeless tobacco. She reports that she does not drink alcohol and does not use drugs.  Allergies[1]  Medications  Current Medications[2]  Vitals   Vitals:   2024/03/15 0645 15-Mar-2024 0700 03-15-2024 0735 March 15, 2024 0823  BP: (!) 114/46 (!) 104/46 (!) 106/48 113/75  Pulse: 81 83 72 80  Resp: (!) 26 (!) 32 (!) 26 (!) 26  Temp:      TempSrc:      SpO2: 93% (!) 88% 96% 98%  Weight:      Height:        Body mass index is 35.96 kg/m.   Physical Exam   Constitutional: Ill-appearing, intubated elderly patient in no acute distress Eyes: No scleral injection.  HENT:  Endotracheal tube in place Head: Normocephalic.  Cardiovascular: A-fib on the monitor Respiratory: Respirations synchronous with ventilator Skin: WDI.   Neurologic Examination   (On sedation with low-dose fentanyl ) patient does not respond to name or follow commands.  Left pupil 4 mm reactive to 3, right pupil 6 mm, nonreactive, corneal reflex is weakly positive bilaterally, no response to noxious stimuli in all 4 extremities  Labs/Imaging/Neurodiagnostic studies   CBC:  Recent Labs  Lab 03-10-2024 0056 02/11/24 0454 02/14/24 1057 03/15/24 0033 March 15, 2024 0607  WBC 11.2*   < > 25.9* 31.7*  --   NEUTROABS 7.5  --  24.1*  --   --   HGB 9.7*   < > 11.2* 11.8* 10.5*  HCT 31.6*   < > 36.1 39.4 31.0*  MCV 88.0   < > 89.6 91.4  --   PLT 317   < > 259  261 263  --    < > = values in this interval not displayed.   Basic Metabolic Panel:  Lab Results  Component Value Date   NA 135 03-15-24   K 6.6 (HH) 2024-03-15   CO2 16 (L) 03/15/24   GLUCOSE 108 (H) 03/15/2024   BUN 105 (H) 15-Mar-2024   CREATININE 3.95 (H) 03-15-24   CALCIUM  7.4 (L) 03-15-2024   GFRNONAA 12 (L) March 15, 2024   GFRAA >60 01/15/2016   Lipid Panel:  Lab Results  Component Value Date   LDLCALC 23 12/30/2023   HgbA1c:  Lab Results  Component Value Date   HGBA1C 5.9 (H) 02/14/2024   Urine Drug Screen: No results found for: LABOPIA, COCAINSCRNUR, LABBENZ, AMPHETMU, THCU, LABBARB  Alcohol Level No results found for: [facility] INR  Lab Results  Component Value Date   INR 5.6 (HH) 02/14/2024   APTT  Lab Results  Component Value Date   APTT 63 (H) 02/14/2024    CT Head without contrast(Personally reviewed): No acute abnormality  CT angio Head and Neck with contrast(Personally reviewed): No LVO  MRI Brain(Personally reviewed): Pending  Neurodiagnostics rEEG:  Pending  ASSESSMENT   Debra Graves is a 69 y.o. female with hx of A-fib on Eliquis , hypothyroidism, hyperlipidemia, CAD,  CHF, CKD and anxiety who originally presented with multiple falls and was found to be in A-fib  with RVR.  She was also noted to be hyperkalemic.  She was found to be in thyroid  storm and was intubated.  She is awaiting initiation of CRRT for AKI.  Earlier this morning around 530, she was noted to be able to follow commands while on low-dose propofol .  However, at this point, her pupils are unequal with right pupil nonreactive with no response to commands or noxious stimuli.  Initial stat head CT demonstrated no acute abnormality, and heparin  has been paused due to supratherapeutic levels.  Due to concern for basilar occlusion, stat CTA was obtained, but this was negative for LVO.  Will obtain stat EEG with conversion to LTM if warranted and MRI brain when patient is stable.  Suspect that deficits are likely due to toxic metabolic encephalopathy in the setting of critical illness and renal failure.  RECOMMENDATIONS   - Brain MRI when stable - EEG with potential conversion to LTM - Treatment of thyroid  storm and A-fib per primary team - Neurology will continue to follow ______________________________________________________________________  Patient seen by NP with MD, MD to edit note as needed.  Signed, Cortney E Everitt Clint Kill, NP [facility]    Attending Neurohospitalist Addendum Patient seen and examined with APP/Resident. Agree with the history and physical as documented above. Agree with the plan as documented, which I helped formulate. I have independently reviewed the chart, obtained history, review of systems and examined the patient.I have personally reviewed pertinent head/neck/spine imaging (CT/MRI).  Stat CTA head and neck with no LVO. Study EEG with no evidence of seizures Likely toxic metabolic encephalopathy-medical management per primary team Plan discussed with Dr. Sharie    Please feel free to call with any questions.  -- Eligio Lav, MD Neurologist [facility] Pager: [phone]   CRITICAL CARE ATTESTATION Performed by: Eligio Lav, MD Total critical care time: 55 minutes Critical care time was exclusive of separately billable procedures and treating other patients and/or supervising APPs/Residents/Students Critical care was necessary to treat or prevent imminent or life-threatening deterioration. This patient is critically ill and at significant risk for neurological worsening and/or death and care requires constant monitoring. Critical care was time spent personally by me on the following activities: development of treatment plan with patient and/or surrogate as well as nursing, discussions with consultants, evaluation of patient's response to treatment, examination of patient, obtaining history from patient or surrogate, ordering and performing treatments and interventions, ordering and review of laboratory studies, ordering and review of radiographic studies, pulse oximetry, re-evaluation of patient's condition, participation in multidisciplinary rounds and medical decision making of high complexity in the care of this patient.     [1]  Allergies Allergen Reactions   Albuterol    Bactrim [Sulfamethoxazole-Trimethoprim]    Vytorin [Ezetimibe -Simvastatin]     Myalgias   [2]  Current Facility-Administered Medications:    acetaminophen  (TYLENOL ) tablet 650 mg, 650 mg, Per Tube, Q6H PRN, Albustami, Omar M, MD   calcium  gluconate 2 g/ 100 mL sodium chloride  IVPB, 2 g, Intravenous, Once, Hussein, Lenny, MD   Chlorhexidine  Gluconate Cloth 2 % PADS 6 each, 6 each, Topical, Daily, Tat, David, MD, 6 each at 02/14/24 2157   clonazePAM  (KLONOPIN ) tablet 0.5 mg, 0.5 mg, Per Tube, Daily PRN, Sharie Lenny, MD   feeding supplement (OSMOLITE 1.5 CAL) liquid 1,000 mL, 1,000 mL, Per Tube, Continuous, Hussein, Lenny, MD, Last Rate: 20 mL/hr at Mar 12, 2024 0741, Infusion Verify at 12-Mar-2024 0741   feeding supplement (PROSource TF20)  liquid 60 mL, 60 mL,  Per Tube, TID, Sharie Bourbon, MD, 60 mL at 02/14/24 2156   fentaNYL  in NS (59mcg/ml) infusion-PREMIX, 0-400 mcg/hr, Intravenous, Continuous, Albustami, Mancel HERO, MD, Last Rate: 2.5 mL/hr at Mar 09, 2024 0740, 25 mcg/hr at 03/09/2024 0740   hydrocortisone  sodium succinate  (SOLU-CORTEF ) 100 MG injection 100 mg, 100 mg, Intravenous, Q6H, Albustami, Omar M, MD, 100 mg at 03/09/24 9461   insulin  aspart (novoLOG ) injection 0-20 Units, 0-20 Units, Subcutaneous, Q4H, Albustami, Omar M, MD, 4 Units at 02/14/24 2025   ipratropium (ATROVENT ) nebulizer solution 0.5 mg, 0.5 mg, Nebulization, Q6H, Albustami, Omar M, MD, 0.5 mg at 03/09/24 9262   methimazole  (TAPAZOLE ) tablet 20 mg, 20 mg, Per Tube, Q6H, Albustami, Omar M, MD, 20 mg at 03/09/2024 9461   midodrine  (PROAMATINE ) tablet 10 mg, 10 mg, Per Tube, Q8H, Sharie Bourbon, MD, 10 mg at 03-09-2024 9461   multivitamin with minerals tablet 1 tablet, 1 tablet, Per Tube, Daily, Sharie Bourbon, MD, 1 tablet at 02/14/24 1241   norepinephrine  (LEVOPHED ) 4mg  in (0.016 mg/mL) premix infusion, 0-10 mcg/min, Intravenous, Titrated, Tat, Alm, MD, Last Rate: 30 mL/hr at 03-09-24 0741, 8 mcg/min at 03/09/2024 0741   Oral care mouth rinse, 15 mL, Mouth Rinse, PRN, Tat, Alm, MD   Oral care mouth rinse, 15 mL, Mouth Rinse, Q2H, Hussein, Bourbon, MD, 15 mL at 03/09/24 9461   Oral care mouth rinse, 15 mL, Mouth Rinse, PRN, Sharie Bourbon, MD   pantoprazole  (PROTONIX ) injection 40 mg, 40 mg, Intravenous, Q24H, Antonetta Moccasin B, NP   piperacillin -tazobactam (ZOSYN ) IVPB 2.25 g, 2.25 g, Intravenous, Q6H, Dang, Thuy D, RPH   polyethylene glycol (MIRALAX  / GLYCOLAX ) packet 17 g, 17 g, Per Tube, Daily, Tat, David, MD, 17 g at 02/14/24 0957   polyethylene glycol (MIRALAX  / GLYCOLAX ) packet 17 g, 17 g, Per Tube, Daily PRN, Albustami, Mancel HERO, MD   prochlorperazine  (COMPAZINE ) injection 5 mg, 5 mg, Intravenous, Q6H PRN, Tat, David, MD, 5 mg  at 02/10/24 0011   propofol  (DIPRIVAN ) 1000 MG/100ML infusion, 0-80 mcg/kg/min, Intravenous, Continuous, Tat, David, MD, Stopped at 2024/03/09 0405   propranolol  (INDERAL ) tablet 60 mg, 60 mg, Per Tube, Q4H, Albustami, Omar M, MD, 60 mg at 2024/03/09 0357   senna (SENOKOT) tablet 8.6 mg, 1 tablet, Per Tube, BID, Tat, David, MD, 8.6 mg at 02/14/24 2157   sodium bicarbonate  tablet 650 mg, 650 mg, Per Tube, TID, Antonetta Moccasin B, NP   sodium zirconium cyclosilicate  (LOKELMA ) packet 10 g, 10 g, Per Tube, TID, Sharie Bourbon, MD, 10 g at 02/14/24 2157   sodium zirconium cyclosilicate  (LOKELMA ) packet 10 g, 10 g, Per Tube, Once, Antonetta Moccasin B, NP   thiamine  (VITAMIN B1) tablet 100 mg, 100 mg, Per Tube, Daily, Sharie Bourbon, MD, 100 mg at 02/14/24 1255

## 2024-02-21 DEATH — deceased

## 2024-02-23 ENCOUNTER — Ambulatory Visit: Admitting: Family Medicine

## 2024-03-28 ENCOUNTER — Ambulatory Visit: Admitting: "Endocrinology

## 2024-06-06 ENCOUNTER — Ambulatory Visit: Payer: Self-pay
